# Patient Record
Sex: Female | Born: 1970 | Race: Black or African American | Hispanic: No | Marital: Married | Smoking: Current every day smoker
Health system: Southern US, Community
[De-identification: ages and names within clinical notes are randomized; demographics above are authoritative.]

## PROBLEM LIST (undated history)

## (undated) ENCOUNTER — Emergency Department (HOSPITAL_COMMUNITY): Admission: EM | Payer: Self-pay | Source: Home / Self Care

## (undated) DIAGNOSIS — R42 Dizziness and giddiness: Secondary | ICD-10-CM

## (undated) DIAGNOSIS — D649 Anemia, unspecified: Secondary | ICD-10-CM

## (undated) DIAGNOSIS — G629 Polyneuropathy, unspecified: Secondary | ICD-10-CM

## (undated) DIAGNOSIS — F1010 Alcohol abuse, uncomplicated: Secondary | ICD-10-CM

## (undated) HISTORY — PX: CHOLECYSTECTOMY: SHX55

---

## 1999-08-09 ENCOUNTER — Emergency Department (HOSPITAL_COMMUNITY): Admission: EM | Admit: 1999-08-09 | Discharge: 1999-08-09 | Payer: Self-pay | Admitting: Emergency Medicine

## 1999-08-09 ENCOUNTER — Encounter: Payer: Self-pay | Admitting: Emergency Medicine

## 2000-12-17 ENCOUNTER — Encounter: Payer: Self-pay | Admitting: Emergency Medicine

## 2000-12-17 ENCOUNTER — Emergency Department (HOSPITAL_COMMUNITY): Admission: EM | Admit: 2000-12-17 | Discharge: 2000-12-18 | Payer: Self-pay | Admitting: Emergency Medicine

## 2010-11-13 ENCOUNTER — Emergency Department (HOSPITAL_COMMUNITY)
Admission: EM | Admit: 2010-11-13 | Discharge: 2010-11-13 | Discharge status: Against Medical Advice | Payer: Self-pay | Attending: Emergency Medicine | Admitting: Emergency Medicine

## 2010-11-13 DIAGNOSIS — X58XXXA Exposure to other specified factors, initial encounter: Secondary | ICD-10-CM | POA: Insufficient documentation

## 2010-11-13 DIAGNOSIS — S8990XA Unspecified injury of unspecified lower leg, initial encounter: Secondary | ICD-10-CM | POA: Insufficient documentation

## 2011-10-25 ENCOUNTER — Emergency Department (HOSPITAL_BASED_OUTPATIENT_CLINIC_OR_DEPARTMENT_OTHER)
Admission: EM | Admit: 2011-10-25 | Discharge: 2011-10-25 | Disposition: A | Discharge status: Home / Self Care | Payer: Self-pay | Attending: Emergency Medicine | Admitting: Emergency Medicine

## 2011-10-25 ENCOUNTER — Encounter (HOSPITAL_BASED_OUTPATIENT_CLINIC_OR_DEPARTMENT_OTHER): Payer: Self-pay | Admitting: *Deleted

## 2011-10-25 DIAGNOSIS — Z008 Encounter for other general examination: Secondary | ICD-10-CM | POA: Insufficient documentation

## 2011-10-25 DIAGNOSIS — Z0000 Encounter for general adult medical examination without abnormal findings: Secondary | ICD-10-CM

## 2011-10-25 DIAGNOSIS — F17200 Nicotine dependence, unspecified, uncomplicated: Secondary | ICD-10-CM | POA: Insufficient documentation

## 2011-10-25 MED ORDER — PERMETHRIN 5 % EX CREA: TOPICAL_CREAM | CUTANEOUS | Status: AC

## 2011-10-25 NOTE — ED Notes (Signed)
Does not have a rash but was out of work to take care of her grandchild who has scabies.  She needs a note to go back.

## 2011-10-25 NOTE — Discharge Instructions (Signed)
Scabies Scabies are small bugs (mites) that burrow under the skin and cause red bumps and severe itching. These bugs can only be seen with a microscope. Scabies are highly contagious. They can spread easily from person to person by direct contact. They are also spread through sharing clothing or linens that have the scabies mites living in them. It is not unusual for an entire family to become infected through shared towels, clothing, or bedding.  HOME CARE INSTRUCTIONS   Your caregiver may prescribe a cream or lotion to kill the mites. If this cream is prescribed; massage the cream into the entire area of the body from the neck to the bottom of both feet. Also massage the cream into the scalp and face if your child is less than 1 year old. Avoid the eyes and mouth.   Leave the cream on for 8 to12 hours. Do not wash your hands after application. Your child should bathe or shower after the 8 to 12 hour application period. Sometimes it is helpful to apply the cream to your child at right before bedtime.   One treatment is usually effective and will eliminate approximately 95% of infestations. For severe cases, your caregiver may decide to repeat the treatment in 1 week. Everyone in your household should be treated with one application of the cream.   New rashes or burrows should not appear after successful treatment within 24 to 48 hours; however the itching and rash may last for 2 to 4 weeks after successful treatment. If your symptoms persist longer than this, see your caregiver.   Your caregiver also may prescribe a medication to help with the itching or to help the rash go away more quickly.   Scabies can live on clothing or linens for up to 3 days. Your entire child's recently used clothing, towels, stuffed toys, and bed linens should be washed in hot water and then dried in a dryer for at least 20 minutes on high heat. Items that cannot be washed should be enclosed in a plastic bag for at least 3  days.   To help relieve itching, bathe your child in a cool bath or apply cool washcloths to the affected areas.   Your child may return to school after treatment with the prescribed cream.  SEEK MEDICAL CARE IF:   The itching persists longer than 4 weeks after treatment.   The rash spreads or becomes infected (the area has red blisters or yellow-tan crust).  Document Released: 07/02/2005 Document Revised: 06/21/2011 Document Reviewed: 11/10/2008 [employer] Patient Information 2012 [employer]. 

## 2011-10-25 NOTE — ED Provider Notes (Signed)
History     CSN: [id number]  Arrival date & time 10/25/11  1249   First MD Initiated Contact with Patient 10/25/11 1307      Chief Complaint  Patient presents with  . Rash    (Consider location/radiation/quality/duration/timing/severity/associated sxs/prior treatment) HPI Comments: No rash.  Pt exposed to scabies.  Pt needs a note for employer so she can return to work  Patient is a 41 y.o. female presenting with rash. The history is provided by the patient. No language interpreter was used.  Rash  This is a new problem. The problem is associated with nothing.    History reviewed. No pertinent past medical history.  History reviewed. No pertinent past surgical history.  No family history on file.  History  Substance Use Topics  . Smoking status: Current Everyday Smoker -- 0.5 packs/day  . Smokeless tobacco: Not on file  . Alcohol Use: Yes    OB History    Grav Para Term Preterm Abortions TAB SAB Ect Mult Living                  Review of Systems  Skin: Negative for rash.    Allergies  Review of patient's allergies indicates no known allergies.  Home Medications   Current Outpatient Rx  Name Route Sig Dispense Refill  . PERMETHRIN 5 % EX CREA  Apply to affected area once 60 g 0    BP 143/91  Pulse 72  Temp(Src) 98.2 F (36.8 C) (Oral)  Resp 20  SpO2 100%  Physical Exam  Nursing note and vitals reviewed. Constitutional: She is oriented to person, place, and time. She appears well-developed and well-nourished.  HENT:  Head: Normocephalic.  Cardiovascular: Normal rate.   Pulmonary/Chest: Effort normal.  Musculoskeletal: Normal range of motion.  Neurological: She is alert and oriented to person, place, and time.  Skin: Skin is warm and dry.  Psychiatric: She has a normal mood and affect.       No rash    ED Course  Procedures (including critical care time)  Labs Reviewed - No data to display No results found.   1. Normal physical exam        MDM  I gave rx for elemite in case pt develops rash.   I do not see any sign of scabies       Lonia Skinner [place], [place] 10/25/11 [address] [place] , [place] 10/25/11 1329

## 2011-10-25 NOTE — ED Provider Notes (Signed)
Medical screening examination/treatment/procedure(s) were performed by non-physician practitioner and as supervising physician I was immediately available for consultation/collaboration.   Lyanne Co, MD 10/25/11 1330

## 2013-09-08 ENCOUNTER — Encounter (HOSPITAL_COMMUNITY): Payer: Self-pay | Admitting: Emergency Medicine

## 2013-09-08 ENCOUNTER — Emergency Department (HOSPITAL_COMMUNITY)
Admission: EM | Admit: 2013-09-08 | Discharge: 2013-09-08 | Discharge status: Against Medical Advice | Payer: Self-pay | Attending: Emergency Medicine | Admitting: Emergency Medicine

## 2013-09-08 DIAGNOSIS — R6883 Chills (without fever): Secondary | ICD-10-CM | POA: Insufficient documentation

## 2013-09-08 DIAGNOSIS — R197 Diarrhea, unspecified: Secondary | ICD-10-CM | POA: Insufficient documentation

## 2013-09-08 DIAGNOSIS — F17200 Nicotine dependence, unspecified, uncomplicated: Secondary | ICD-10-CM | POA: Insufficient documentation

## 2013-09-08 DIAGNOSIS — R112 Nausea with vomiting, unspecified: Secondary | ICD-10-CM | POA: Insufficient documentation

## 2013-09-08 LAB — COMPREHENSIVE METABOLIC PANEL
ALT: 29 U/L (ref 0–35)
AST: 39 U/L — ABNORMAL HIGH (ref 0–37)
Albumin: 3.8 g/dL (ref 3.5–5.2)
Alkaline Phosphatase: 129 U/L — ABNORMAL HIGH (ref 39–117)
BILIRUBIN TOTAL: 0.3 mg/dL (ref 0.3–1.2)
BUN: 9 mg/dL (ref 6–23)
CO2: 25 meq/L (ref 19–32)
Calcium: 9.9 mg/dL (ref 8.4–10.5)
Chloride: 101 mEq/L (ref 96–112)
Creatinine, Ser: 0.74 mg/dL (ref 0.50–1.10)
GLUCOSE: 118 mg/dL — AB (ref 70–99)
POTASSIUM: 4.2 meq/L (ref 3.7–5.3)
Sodium: 140 mEq/L (ref 137–147)
Total Protein: 8.1 g/dL (ref 6.0–8.3)

## 2013-09-08 LAB — CBC WITH DIFFERENTIAL/PLATELET
BASOS ABS: 0 10*3/uL (ref 0.0–0.1)
Basophils Relative: 0 % (ref 0–1)
Eosinophils Absolute: 0 10*3/uL (ref 0.0–0.7)
Eosinophils Relative: 0 % (ref 0–5)
HCT: 43.7 % (ref 36.0–46.0)
Hemoglobin: 14.9 g/dL (ref 12.0–15.0)
LYMPHS ABS: 1.2 10*3/uL (ref 0.7–4.0)
LYMPHS PCT: 9 % — AB (ref 12–46)
MCH: 33 pg (ref 26.0–34.0)
MCHC: 34.1 g/dL (ref 30.0–36.0)
MCV: 96.7 fL (ref 78.0–100.0)
Monocytes Absolute: 1.2 10*3/uL — ABNORMAL HIGH (ref 0.1–1.0)
Monocytes Relative: 9 % (ref 3–12)
Neutro Abs: 10.7 10*3/uL — ABNORMAL HIGH (ref 1.7–7.7)
Neutrophils Relative %: 82 % — ABNORMAL HIGH (ref 43–77)
Platelets: 247 10*3/uL (ref 150–400)
RBC: 4.52 MIL/uL (ref 3.87–5.11)
RDW: 14.1 % (ref 11.5–15.5)
WBC: 13.1 10*3/uL — AB (ref 4.0–10.5)

## 2013-09-08 LAB — LIPASE, BLOOD: Lipase: 22 U/L (ref 11–59)

## 2013-09-08 MED ORDER — ONDANSETRON 4 MG PO TBDP
8.0000 mg | ORAL_TABLET | Freq: Once | ORAL | Status: AC
  Administered 2013-09-08: 8 mg via ORAL
  Filled 2013-09-08: qty 2

## 2013-09-08 NOTE — ED Notes (Signed)
Pt. reports nausea , vomitting and diarrhea with chills after eating clams this evening .

## 2016-12-09 ENCOUNTER — Encounter (HOSPITAL_BASED_OUTPATIENT_CLINIC_OR_DEPARTMENT_OTHER): Payer: Self-pay | Admitting: Emergency Medicine

## 2016-12-09 ENCOUNTER — Emergency Department (HOSPITAL_BASED_OUTPATIENT_CLINIC_OR_DEPARTMENT_OTHER)
Admission: EM | Admit: 2016-12-09 | Discharge: 2016-12-09 | Disposition: A | Discharge status: Home / Self Care | Payer: Self-pay | Attending: Emergency Medicine | Admitting: Emergency Medicine

## 2016-12-09 ENCOUNTER — Emergency Department (HOSPITAL_BASED_OUTPATIENT_CLINIC_OR_DEPARTMENT_OTHER): Payer: Self-pay

## 2016-12-09 DIAGNOSIS — F17200 Nicotine dependence, unspecified, uncomplicated: Secondary | ICD-10-CM | POA: Insufficient documentation

## 2016-12-09 DIAGNOSIS — S39012A Strain of muscle, fascia and tendon of lower back, initial encounter: Secondary | ICD-10-CM

## 2016-12-09 DIAGNOSIS — W0110XA Fall on same level from slipping, tripping and stumbling with subsequent striking against unspecified object, initial encounter: Secondary | ICD-10-CM | POA: Insufficient documentation

## 2016-12-09 DIAGNOSIS — Y999 Unspecified external cause status: Secondary | ICD-10-CM | POA: Insufficient documentation

## 2016-12-09 DIAGNOSIS — Y939 Activity, unspecified: Secondary | ICD-10-CM | POA: Insufficient documentation

## 2016-12-09 DIAGNOSIS — S300XXA Contusion of lower back and pelvis, initial encounter: Secondary | ICD-10-CM

## 2016-12-09 DIAGNOSIS — A599 Trichomoniasis, unspecified: Secondary | ICD-10-CM

## 2016-12-09 DIAGNOSIS — Y929 Unspecified place or not applicable: Secondary | ICD-10-CM | POA: Insufficient documentation

## 2016-12-09 LAB — URINALYSIS, MICROSCOPIC (REFLEX)

## 2016-12-09 LAB — URINALYSIS, ROUTINE W REFLEX MICROSCOPIC
Bilirubin Urine: NEGATIVE
Glucose, UA: NEGATIVE mg/dL
Ketones, ur: 15 mg/dL — AB
Nitrite: POSITIVE — AB
Protein, ur: NEGATIVE mg/dL
Specific Gravity, Urine: 1.023 (ref 1.005–1.030)
pH: 5.5 (ref 5.0–8.0)

## 2016-12-09 MED ORDER — KETOROLAC TROMETHAMINE 60 MG/2ML IM SOLN
60.0000 mg | Freq: Once | INTRAMUSCULAR | Status: AC
  Administered 2016-12-09: 60 mg via INTRAMUSCULAR
  Filled 2016-12-09: qty 2

## 2016-12-09 MED ORDER — NAPROXEN 375 MG PO TABS: 375.0000 mg | ORAL_TABLET | Freq: Two times a day (BID) | ORAL | 0 refills | Status: DC

## 2016-12-09 MED ORDER — METRONIDAZOLE 500 MG PO TABS: 500.0000 mg | ORAL_TABLET | Freq: Two times a day (BID) | ORAL | 0 refills | Status: DC

## 2016-12-09 MED ORDER — CYCLOBENZAPRINE HCL 10 MG PO TABS
10.0000 mg | ORAL_TABLET | Freq: Once | ORAL | Status: AC
  Administered 2016-12-09: 10 mg via ORAL
  Filled 2016-12-09: qty 1

## 2016-12-09 MED ORDER — CYCLOBENZAPRINE HCL 10 MG PO TABS: 10.0000 mg | ORAL_TABLET | Freq: Two times a day (BID) | ORAL | 0 refills | Status: DC | PRN

## 2016-12-09 NOTE — ED Notes (Signed)
Pt called for room placement, no response from lobby.

## 2016-12-09 NOTE — ED Notes (Signed)
PT reports she fell last week and has been having right lower back and right flank pain since and having trouble having BM and painful gas. Pt sts she lost her balance and fell backwards on concrete landing on her tailbone.

## 2016-12-09 NOTE — ED Notes (Signed)
ED Provider at bedside. 

## 2016-12-09 NOTE — ED Provider Notes (Signed)
[facility]   CSN: [id number] Arrival date & time: 12/09/16  1416  By signing my name below, I, Teofilo Pod, attest that this documentation has been prepared under the direction and in the presence of Gwyneth Sprout, MD . Electronically Signed: Teofilo Pod, ED Scribe. 12/09/2016. 4:35 PM.    History   Chief Complaint Chief Complaint  Patient presents with  . Fall  . Back Pain   The history is provided by the patient. No language interpreter was used.   HPI Comments:  Veronica Gaines is a 46 y.o. female who presents to the Emergency Department complaining of ongoing lower back pain x 1 week. Pt reports that she fell 1 week ago, landing on her buttocks on concrete. Pt also reports that for the last 2 days, she has seen blood on the tissue after urinating. She has used ice and heat with no relief. Denies any LOC, vaginal pain.     History reviewed. No pertinent past medical history.  There are no active problems to display for this patient.   Past Surgical History:  Procedure Laterality Date  . CESAREAN SECTION      OB History    No data available       Home Medications    Prior to Admission medications   Not on File    Family History No family history on file.  Social History Social History  Substance Use Topics  . Smoking status: Current Every Day Smoker    Packs/day: 0.50  . Smokeless tobacco: Never Used  . Alcohol use Yes     Allergies   Patient has no known allergies.   Review of Systems Review of Systems  All systems reviewed and are negative for acute change except as noted in the HPI.    Physical Exam Updated Vital Signs BP (!) 125/103 (BP Location: Left Arm)   Pulse 84   Temp 99.2 F (37.3 C) (Oral)   Resp 20   Ht 5\' 7"  (1.702 m)   Wt 250 lb (113.4 kg)   SpO2 99%   BMI 39.16 kg/m   Physical Exam  Constitutional: She is oriented to person, place, and time. She appears well-developed  and well-nourished. No distress.  HENT:  Head: Normocephalic and atraumatic.  Eyes: Conjunctivae are normal.  Cardiovascular: Normal rate, regular rhythm and normal heart sounds.   Pulmonary/Chest: Effort normal and breath sounds normal.  Abdominal: Soft. She exhibits no distension. There is no tenderness.  Musculoskeletal:  Tenderness in the sacrum and coccyx with palpation with right sided paralumbar tenderness. No ecchymosis or swelling present.   Neurological: She is alert and oriented to person, place, and time. No sensory deficit. Coordination normal.  Skin: Skin is warm and dry.  Psychiatric: She has a normal mood and affect.  Nursing note and vitals reviewed.    ED Treatments / Results  DIAGNOSTIC STUDIES:  Oxygen Saturation is 99% on RA, normal by my interpretation.    COORDINATION OF CARE:  4:32 PM Discussed treatment plan with pt at bedside and pt agreed to plan.   Labs (all labs ordered are listed, but only abnormal results are displayed) Labs Reviewed  URINALYSIS, ROUTINE W REFLEX MICROSCOPIC - Abnormal; Notable for the following:       Result Value   Color, Urine AMBER (*)    APPearance CLOUDY (*)    Hgb urine dipstick TRACE (*)    Ketones, ur 15 (*)    Nitrite POSITIVE (*)  Leukocytes, UA MODERATE (*)    All other components within normal limits  URINALYSIS, MICROSCOPIC (REFLEX) - Abnormal; Notable for the following:    Bacteria, UA MANY (*)    Squamous Epithelial / LPF 0-5 (*)    All other components within normal limits    EKG  EKG Interpretation None       Radiology Dg Sacrum/coccyx  Result Date: 12/09/2016 CLINICAL DATA:  Fall 1 week ago, pain in coccyx area. EXAM: SACRUM AND COCCYX - 2+ VIEW COMPARISON:  None. FINDINGS: There is no evidence of fracture or other focal bone lesions. IMPRESSION: Negative. Electronically Signed   By: Bary RichardStan  Maynard M.D.   On: 12/09/2016 16:23    Procedures Procedures (including critical care  time)  Medications Ordered in ED Medications  ketorolac (TORADOL) injection 60 mg (not administered)  cyclobenzaprine (FLEXERIL) tablet 10 mg (not administered)     Initial Impression / Assessment and Plan / ED Course  I have reviewed the triage vital signs and the nursing notes.  Pertinent labs & imaging results that were available during my care of the patient were reviewed by me and considered in my medical decision making (see chart for details).    Patient presenting due to ongoing back pain that started a week ago after she had a mechanical fall onto her coccyx. She has pain in the sacral area and lower lumbar area radiating over to the right side. There is no significant ecchymosis or swelling. Patient has normal gait strength and sensation. Feel most likely lumbar strain. X-ray is negative for sacral or coccyx fracture. And she is neurologically intact. Secondly patient has noticed some blood in her urine over the last few days but denies any vaginal discharge, bleeding. She does not have dysuria, fever or flank pain. Patient on exam today is found to have Trichomonas in her urine along with trace blood and leukocytes with white blood cells. Feel most likely patient has urethralis from Trichomonas. Patient was told that she had trichomonas and was treated with Flagyl. She was given anti-inflammatories and Flexeril for her musculoskeletal pain as well as follow-up.  Final Clinical Impressions(s) / ED Diagnoses   Final diagnoses:  Sacral contusion, initial encounter  Lumbar strain, initial encounter  Trichomonas infection    New Prescriptions Discharge Medication List as of 12/09/2016  5:20 PM    START taking these medications   Details  cyclobenzaprine (FLEXERIL) 10 MG tablet Take 1 tablet (10 mg total) by mouth 2 (two) times daily as needed for muscle spasms., Starting Sun 12/09/2016, Print    metroNIDAZOLE (FLAGYL) 500 MG tablet Take 1 tablet (500 mg total) by mouth 2 (two)  times daily., Starting Sun 12/09/2016, Print    naproxen (NAPROSYN) 375 MG tablet Take 1 tablet (375 mg total) by mouth 2 (two) times daily., Starting Sun 12/09/2016, Print       I personally performed the services described in this documentation, which was scribed in my presence.  The recorded information has been reviewed and considered.    Gwyneth SproutPlunkett, Talea Manges, MD 12/09/16 [phone]

## 2016-12-09 NOTE — ED Triage Notes (Signed)
Fell x 1 week ago with lower back pain . No LOC, has had 1 episode of hematuria today.

## 2017-06-03 ENCOUNTER — Encounter: Payer: Self-pay | Admitting: Podiatry

## 2017-08-10 ENCOUNTER — Other Ambulatory Visit: Payer: Self-pay

## 2017-08-10 ENCOUNTER — Emergency Department (HOSPITAL_BASED_OUTPATIENT_CLINIC_OR_DEPARTMENT_OTHER)
Admission: EM | Admit: 2017-08-10 | Discharge: 2017-08-10 | Disposition: A | Discharge status: Home / Self Care | Payer: Self-pay | Attending: Emergency Medicine | Admitting: Emergency Medicine

## 2017-08-10 ENCOUNTER — Emergency Department (HOSPITAL_BASED_OUTPATIENT_CLINIC_OR_DEPARTMENT_OTHER): Payer: Self-pay

## 2017-08-10 ENCOUNTER — Encounter (HOSPITAL_BASED_OUTPATIENT_CLINIC_OR_DEPARTMENT_OTHER): Payer: Self-pay | Admitting: Emergency Medicine

## 2017-08-10 DIAGNOSIS — R030 Elevated blood-pressure reading, without diagnosis of hypertension: Secondary | ICD-10-CM | POA: Insufficient documentation

## 2017-08-10 DIAGNOSIS — M7989 Other specified soft tissue disorders: Secondary | ICD-10-CM | POA: Insufficient documentation

## 2017-08-10 DIAGNOSIS — F17210 Nicotine dependence, cigarettes, uncomplicated: Secondary | ICD-10-CM | POA: Insufficient documentation

## 2017-08-10 DIAGNOSIS — Z79899 Other long term (current) drug therapy: Secondary | ICD-10-CM | POA: Insufficient documentation

## 2017-08-10 NOTE — Discharge Instructions (Signed)
Keep your your right leg elevated above your heart as much as possible.  Call the number on these instructions in 2 days to get a primary care physician.  You should get your blood pressure rechecked within the next 3 weeks and get your leg rechecked if swelling does not go down within the next 3 weeks.  Today's blood pressure was mildly elevated at 136/75

## 2017-08-10 NOTE — ED Provider Notes (Signed)
[facility] EMERGENCY DEPARTMENT Provider Note   CSN: [id number] Arrival date & time: 08/10/17  1308     History   Chief Complaint Chief Complaint  Patient presents with  . Leg Pain    HPI Veronica Gaines is a 47 y.o. female.  Complains of right lower leg pain and swelling onset 5 days ago.  Pain and swelling worse at the end of the day after prolonged standing improved with elevation.  Treated with Epson salts with partial relief.  No chest pain or shortness of breath no other complaint.  With no other associated symptoms.  HPI  History reviewed. No pertinent past medical history.  There are no active problems to display for this patient.   Past Surgical History:  Procedure Laterality Date  . CESAREAN SECTION      OB History    No data available       Home Medications    Prior to Admission medications   Medication Sig Start Date End Date Taking? Authorizing Provider  cyclobenzaprine (FLEXERIL) 10 MG tablet Take 1 tablet (10 mg total) by mouth 2 (two) times daily as needed for muscle spasms. 12/09/16   Gwyneth Sprout, MD  metroNIDAZOLE (FLAGYL) 500 MG tablet Take 1 tablet (500 mg total) by mouth 2 (two) times daily. 12/09/16   Gwyneth Sprout, MD  naproxen (NAPROSYN) 375 MG tablet Take 1 tablet (375 mg total) by mouth 2 (two) times daily. 12/09/16   Gwyneth Sprout, MD    Family History No family history on file.  Social History Social History   Tobacco Use  . Smoking status: Current Every Day Smoker    Packs/day: 0.50  . Smokeless tobacco: Never Used  Substance Use Topics  . Alcohol use: Yes  . Drug use: No  Drinks 1/2 gallon of liquor per week   Allergies   Patient has no known allergies.   Review of Systems Review of Systems  Constitutional: Negative.   HENT: Negative.   Respiratory: Negative.   Cardiovascular: Negative.   Gastrointestinal: Negative.   Genitourinary:       Post menopausal  Musculoskeletal: Positive for  myalgias.       RightLeg pain and swelling  Skin: Negative.   Neurological: Negative.   Psychiatric/Behavioral: Negative.   All other systems reviewed and are negative.    Physical Exam Updated Vital Signs BP 119/78 (BP Location: Right Arm)   Pulse 86   Temp 98.4 F (36.9 C) (Oral)   Resp 18   Ht 5\' 7"  (1.702 m)   Wt 124.7 kg (275 lb)   SpO2 98%   BMI 43.07 kg/m   Physical Exam  Constitutional: She appears well-developed and well-nourished. No distress.  HENT:  Head: Normocephalic and atraumatic.  Eyes: Conjunctivae are normal. Pupils are equal, round, and reactive to light.  Neck: Neck supple. No tracheal deviation present. No thyromegaly present.  Cardiovascular: Normal rate and regular rhythm.  No murmur heard. Pulmonary/Chest: Effort normal and breath sounds normal.  Abdominal: Soft. Bowel sounds are normal. She exhibits no distension. There is no tenderness.  Obese  Musculoskeletal: Normal range of motion. She exhibits no edema or tenderness.  Right lower extremity with 1+ pretibial pitting edema calf is also slightly swollen.  No crepitance.  Minimally tender not red or warm in comparison to contralateral leg.  DP and PT pulses 2+.  Good capillary refill.  All other extremities without redness swelling or tenderness neurovascular intact  Neurological: She is alert. Coordination normal.  Skin: Skin is warm and dry. No rash noted.  Psychiatric: She has a normal mood and affect.  Nursing note and vitals reviewed.    ED Treatments / Results  Labs (all labs ordered are listed, but only abnormal results are displayed) Labs Reviewed - No data to display  EKG  EKG Interpretation None       Radiology No results found.  Procedures Procedures (including critical care time)  Medications Ordered in ED Medications - No data to display   Initial Impression / Assessment and Plan / ED Course  I have reviewed the triage vital signs and the nursing  notes.  Pertinent labs & imaging results that were available during my care of the patient were reviewed by me and considered in my medical decision making (see chart for details).     4:50 PM patient resting comfortably.  No distress, ambulates without difficulty.  I suspect lymphedema of the leg.  I do not detect signs of infection. Plan elevation of leg.  Blood pressure recheck 3 weeks.  Referral primary care  Final Clinical Impressions(s) / ED Diagnoses  Dx #1 right leg swelling #2 elevated blood pressure Final diagnoses:  None    ED Discharge Orders    None       Doug SouJacubowitz, Pricsilla Lindvall, MD 08/10/17 1657

## 2017-08-10 NOTE — ED Triage Notes (Signed)
R calf pain and ankle swelling x 3 days. States she started a new job where she is on her feet a lot. Denies SOB

## 2017-08-10 NOTE — ED Notes (Signed)
Patient transported to Ultrasound 

## 2019-12-08 ENCOUNTER — Emergency Department (HOSPITAL_BASED_OUTPATIENT_CLINIC_OR_DEPARTMENT_OTHER)
Admission: EM | Admit: 2019-12-08 | Discharge: 2019-12-08 | Disposition: A | Discharge status: Home / Self Care | Payer: Self-pay | Attending: Emergency Medicine | Admitting: Emergency Medicine

## 2019-12-08 ENCOUNTER — Encounter (HOSPITAL_BASED_OUTPATIENT_CLINIC_OR_DEPARTMENT_OTHER): Payer: Self-pay

## 2019-12-08 ENCOUNTER — Other Ambulatory Visit: Payer: Self-pay

## 2019-12-08 ENCOUNTER — Emergency Department (HOSPITAL_BASED_OUTPATIENT_CLINIC_OR_DEPARTMENT_OTHER): Payer: Self-pay

## 2019-12-08 DIAGNOSIS — R42 Dizziness and giddiness: Secondary | ICD-10-CM | POA: Insufficient documentation

## 2019-12-08 DIAGNOSIS — Z79899 Other long term (current) drug therapy: Secondary | ICD-10-CM | POA: Insufficient documentation

## 2019-12-08 DIAGNOSIS — F17210 Nicotine dependence, cigarettes, uncomplicated: Secondary | ICD-10-CM | POA: Insufficient documentation

## 2019-12-08 DIAGNOSIS — Z20822 Contact with and (suspected) exposure to covid-19: Secondary | ICD-10-CM | POA: Insufficient documentation

## 2019-12-08 DIAGNOSIS — H5500 Unspecified nystagmus: Secondary | ICD-10-CM | POA: Insufficient documentation

## 2019-12-08 LAB — DIFFERENTIAL
Abs Immature Granulocytes: 0.02 10*3/uL (ref 0.00–0.07)
Basophils Absolute: 0 10*3/uL (ref 0.0–0.1)
Basophils Relative: 0 %
Eosinophils Absolute: 0.1 10*3/uL (ref 0.0–0.5)
Eosinophils Relative: 1 %
Immature Granulocytes: 0 %
Lymphocytes Relative: 37 %
Lymphs Abs: 2.8 10*3/uL (ref 0.7–4.0)
Monocytes Absolute: 0.7 10*3/uL (ref 0.1–1.0)
Monocytes Relative: 9 %
Neutro Abs: 4 10*3/uL (ref 1.7–7.7)
Neutrophils Relative %: 53 %

## 2019-12-08 LAB — COMPREHENSIVE METABOLIC PANEL
ALT: 30 U/L (ref 0–44)
AST: 59 U/L — ABNORMAL HIGH (ref 15–41)
Albumin: 3.8 g/dL (ref 3.5–5.0)
Alkaline Phosphatase: 117 U/L (ref 38–126)
Anion gap: 14 (ref 5–15)
BUN: 9 mg/dL (ref 6–20)
CO2: 25 mmol/L (ref 22–32)
Calcium: 9.1 mg/dL (ref 8.9–10.3)
Chloride: 102 mmol/L (ref 98–111)
Creatinine, Ser: 0.8 mg/dL (ref 0.44–1.00)
GFR calc Af Amer: >60 mL/min (ref >60)
GFR calc non Af Amer: >60 mL/min (ref >60)
Glucose, Bld: 113 mg/dL — ABNORMAL HIGH (ref 70–99)
Potassium: 4.1 mmol/L (ref 3.5–5.1)
Sodium: 141 mmol/L (ref 135–145)
Total Bilirubin: 0.6 mg/dL (ref 0.3–1.2)
Total Protein: 7.8 g/dL (ref 6.5–8.1)

## 2019-12-08 LAB — PROTIME-INR
INR: 1 (ref 0.8–1.2)
Prothrombin Time: 13.1 seconds (ref 11.4–15.2)

## 2019-12-08 LAB — CBC
HCT: 46.1 % — ABNORMAL HIGH (ref 36.0–46.0)
Hemoglobin: 15 g/dL (ref 12.0–15.0)
MCH: 34.4 pg — ABNORMAL HIGH (ref 26.0–34.0)
MCHC: 32.5 g/dL (ref 30.0–36.0)
MCV: 105.7 fL — ABNORMAL HIGH (ref 80.0–100.0)
Platelets: 273 10*3/uL (ref 150–400)
RBC: 4.36 MIL/uL (ref 3.87–5.11)
RDW: 14 % (ref 11.5–15.5)
WBC: 7.6 10*3/uL (ref 4.0–10.5)
nRBC: 0 % (ref 0.0–0.2)

## 2019-12-08 LAB — ETHANOL: Alcohol, Ethyl (B): 13 mg/dL — ABNORMAL HIGH (ref <10)

## 2019-12-08 LAB — SARS CORONAVIRUS 2 BY RT PCR (HOSPITAL ORDER, PERFORMED IN ~~LOC~~ HOSPITAL LAB): SARS Coronavirus 2: NEGATIVE

## 2019-12-08 LAB — APTT: aPTT: 26 seconds (ref 24–36)

## 2019-12-08 MED ORDER — DIPHENHYDRAMINE HCL 50 MG/ML IJ SOLN
25.0000 mg | Freq: Once | INTRAMUSCULAR | Status: AC
  Administered 2019-12-08: 25 mg via INTRAVENOUS
  Filled 2019-12-08: qty 1

## 2019-12-08 MED ORDER — PROCHLORPERAZINE EDISYLATE 10 MG/2ML IJ SOLN
10.0000 mg | Freq: Once | INTRAMUSCULAR | Status: AC
  Administered 2019-12-08: 10 mg via INTRAVENOUS
  Filled 2019-12-08: qty 2

## 2019-12-08 MED ORDER — IOHEXOL 350 MG/ML SOLN
100.0000 mL | Freq: Once | INTRAVENOUS | Status: AC | PRN
  Administered 2019-12-08: 100 mL via INTRAVENOUS

## 2019-12-08 NOTE — ED Provider Notes (Signed)
[facility] EMERGENCY DEPARTMENT Provider Note   CSN: [id number] Arrival date & time: 12/08/19  [id number]     History Chief Complaint  Patient presents with  . Headache    Veronica HardyVicki Y Gaines is a 49 y.o. female.  49 yo F with a chief complaints of a headache.  Patient woke up this morning and felt normal took a shower and when she got out she felt like she had a terrible headache that started on the left side.  Made her feel like she was unsteady and had difficulty walking.  She also feels that her eyes are moving uncontrollably.  She denies one-sided numbness or weakness denies difficulty with speech or swallowing.  Denies recent trauma.  She does feel that it radiates into her left side of her neck.  The history is provided by the patient.  Headache Pain location:  Frontal Radiates to:  Eyes Severity currently:  10/10 Severity at highest:  10/10 Onset quality:  Sudden Duration:  3 hours Timing:  Constant Progression:  Unchanged Chronicity:  New Similar to prior headaches: no   Relieved by:  Nothing Worsened by:  Nothing Ineffective treatments:  None tried Associated symptoms: eye pain   Associated symptoms: no congestion, no dizziness, no fever, no myalgias, no nausea and no vomiting        History reviewed. No pertinent past medical history.  There are no problems to display for this patient.   Past Surgical History:  Procedure Laterality Date  . CESAREAN SECTION       OB History   No obstetric history on file.     No family history on file.  Social History   Tobacco Use  . Smoking status: Current Every Day Smoker    Packs/day: 0.50    Types: Cigarettes  . Smokeless tobacco: Never Used  Substance Use Topics  . Alcohol use: Yes    Comment: daily  . Drug use: No    Home Medications Prior to Admission medications   Medication Sig Start Date End Date Taking? Authorizing Provider  cyclobenzaprine (FLEXERIL) 10 MG tablet Take 1 tablet (10 mg  total) by mouth 2 (two) times daily as needed for muscle spasms. 12/09/16   Gwyneth SproutPlunkett, Whitney, MD  metroNIDAZOLE (FLAGYL) 500 MG tablet Take 1 tablet (500 mg total) by mouth 2 (two) times daily. 12/09/16   Gwyneth SproutPlunkett, Whitney, MD  naproxen (NAPROSYN) 375 MG tablet Take 1 tablet (375 mg total) by mouth 2 (two) times daily. 12/09/16   Gwyneth SproutPlunkett, Whitney, MD    Allergies    Patient has no known allergies.  Review of Systems   Review of Systems  Constitutional: Negative for chills and fever.  HENT: Negative for congestion and rhinorrhea.   Eyes: Positive for pain and visual disturbance. Negative for redness.  Respiratory: Negative for shortness of breath and wheezing.   Cardiovascular: Negative for chest pain and palpitations.  Gastrointestinal: Negative for nausea and vomiting.  Genitourinary: Negative for dysuria and urgency.  Musculoskeletal: Negative for arthralgias and myalgias.  Skin: Negative for pallor and wound.  Neurological: Positive for headaches. Negative for dizziness.    Physical Exam Updated Vital Signs BP (!) 130/92   Pulse (!) 56   Temp 97.9 F (36.6 C) (Oral)   Resp 17   Ht 5\' 7"  (1.702 m)   Wt 119.4 kg   SpO2 98%   BMI 41.22 kg/m   Physical Exam Vitals and nursing note reviewed.  Constitutional:      General: She  is not in acute distress.    Appearance: She is well-developed. She is not diaphoretic.  HENT:     Head: Normocephalic and atraumatic.  Eyes:     Pupils: Pupils are equal, round, and reactive to light.     Comments: Nonfatigable leftward going nystagmus.  Corneas are hazy bilaterally but pupils are reactive 3 mm  Cardiovascular:     Rate and Rhythm: Normal rate and regular rhythm.     Heart sounds: No murmur. No friction rub. No gallop.   Pulmonary:     Effort: Pulmonary effort is normal.     Breath sounds: No wheezing or rales.  Abdominal:     General: There is no distension.     Palpations: Abdomen is soft.     Tenderness: There is no  abdominal tenderness.  Musculoskeletal:        General: No tenderness.     Cervical back: Normal range of motion and neck supple.  Skin:    General: Skin is warm and dry.  Neurological:     Mental Status: She is alert and oriented to person, place, and time.     GCS: GCS eye subscore is 4. GCS verbal subscore is 5. GCS motor subscore is 6.     Comments: Subjective left-sided diminished sensation to touch along the face.  Globally weak.  No appreciable ataxia with the arms or legs.  Unable to ambulate.  Psychiatric:        Behavior: Behavior normal.     ED Results / Procedures / Treatments   Labs (all labs ordered are listed, but only abnormal results are displayed) Labs Reviewed  ETHANOL - Abnormal; Notable for the following components:      Result Value   Alcohol, Ethyl (B) 13 (*)    All other components within normal limits  CBC - Abnormal; Notable for the following components:   HCT 46.1 (*)    MCV 105.7 (*)    MCH 34.4 (*)    All other components within normal limits  COMPREHENSIVE METABOLIC PANEL - Abnormal; Notable for the following components:   Glucose, Bld 113 (*)    AST 59 (*)    All other components within normal limits  DIFFERENTIAL  PROTIME-INR  APTT  RAPID URINE DRUG SCREEN, HOSP PERFORMED  URINALYSIS, ROUTINE W REFLEX MICROSCOPIC  PREGNANCY, URINE    EKG EKG Interpretation  Date/Time:  Tuesday Dec 08 2019 09:47:47 EDT Ventricular Rate:  60 PR Interval:    QRS Duration: 86 QT Interval:  452 QTC Calculation: 452 R Axis:   57 Text Interpretation: Sinus rhythm new flipped t wave in lead III Otherwise no significant change Confirmed by Melene Plan [phone]) on 12/08/2019 9:59:53 AM   Radiology CT Angio Head W or Wo Contrast  Result Date: 12/08/2019 CLINICAL DATA:  Acute neuro deficit, stroke suspected. Headaches and dizziness beginning at 5:30 a.m. Blurred vision. EXAM: CT ANGIOGRAPHY HEAD AND NECK TECHNIQUE: Multidetector CT imaging of the head and neck  was performed using the standard protocol during bolus administration of intravenous contrast. Multiplanar CT image reconstructions and MIPs were obtained to evaluate the vascular anatomy. Carotid stenosis measurements (when applicable) are obtained utilizing NASCET criteria, using the distal internal carotid diameter as the denominator. CONTRAST:  OMNIPAQUE IOHEXOL 350 MG/ML SOLN COMPARISON:  None. FINDINGS: CT HEAD FINDINGS Brain: No acute infarct, hemorrhage, or mass lesion is present. The ventricles are of normal size. No significant extraaxial fluid collection is present. No significant white matter lesions  are present. The brainstem and cerebellum are within normal limits. Vascular: No hyperdense vessel or unexpected calcification. Skull: Calvarium is intact. No focal lytic or blastic lesions are present. No significant extracranial soft tissue lesion is present. Sinuses: The paranasal sinuses and mastoid air cells are clear. Orbits: A remote medial left orbital blowout fracture is noted. Review of the MIP images confirms the above findings Aspects is 10/10. CTA NECK FINDINGS Aortic arch: Knee 3 vessel arch configuration is present. No significant atherosclerotic disease present. No aneurysm or stenosis is present. Right carotid system: The right common carotid artery is within normal limits. Bifurcation is unremarkable. Cervical right ICA is normal. Left carotid system: The left common carotid artery is within normal limits. Bifurcation is unremarkable. Cervical left ICA is. Vertebral arteries: The right vertebral artery is slightly dominant to the left. A high-grade stenosis present at the proximal left vertebral artery. The proximal vessel may be occluded and reconstituted. No distal stenosis or occlusion is present. Right vertebral artery is normal. Skeleton: Vertebral heights and alignment are normal. Mild reversal of normal cervical lordosis is noted. No focal lytic or blastic lesions are present.  Other neck: The soft tissues of the neck are unremarkable. Upper chest: Lung apices are clear. Thoracic inlet is within normal limits. Review of the MIP images confirms the above findings CTA HEAD FINDINGS Anterior circulation: The internal carotid arteries are within normal limits from the skull base to the ICA termini bilaterally. The A1 and M1 segments are normal. The anterior communicating artery is patent. MCA bifurcations are within normal limits. The ACA and MCA branch vessels are normal. Posterior circulation: The right vertebral artery is the dominant vessel. PICA origins are visualized and normal. The vertebrobasilar junction is normal. Both posterior cerebral arteries originate from the basilar tip. PCA branch vessels are normal. Venous sinuses: Dural sinuses are patent. The straight sinus deep cerebral veins are within normal limits. Cortical veins are unremarkable. No intracranial vascular lesion is evident. Anatomic variants: None Review of the MIP images confirms the above findings IMPRESSION: 1. Normal variant CTA Circle of Willis without significant proximal stenosis, aneurysm, or branch vessel occlusion. 2. 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 Although the right vertebral artery is dominant, this could be related to the patient's acute posterior fossa symptoms. 3. Otherwise normal CTA of the neck. 4. Remote medial left orbital blowout fracture. Electronically Signed   By: Marin Roberts M.D.   On: 12/08/2019 10:21   CT Angio Neck W and/or Wo Contrast  Result Date: 12/08/2019 CLINICAL DATA:  Acute neuro deficit, stroke suspected. Headaches and dizziness beginning at 5:30 a.m. Blurred vision. EXAM: CT ANGIOGRAPHY HEAD AND NECK TECHNIQUE: Multidetector CT imaging of the head and neck was performed using the standard protocol during bolus administration of intravenous contrast. Multiplanar CT image reconstructions and MIPs were obtained to evaluate the vascular  anatomy. Carotid stenosis measurements (when applicable) are obtained utilizing NASCET criteria, using the distal internal carotid diameter as the denominator. CONTRAST:  OMNIPAQUE IOHEXOL 350 MG/ML SOLN COMPARISON:  None. FINDINGS: CT HEAD FINDINGS Brain: No acute infarct, hemorrhage, or mass lesion is present. The ventricles are of normal size. No significant extraaxial fluid collection is present. No significant white matter lesions are present. The brainstem and cerebellum are within normal limits. Vascular: No hyperdense vessel or unexpected calcification. Skull: Calvarium is intact. No focal lytic or blastic lesions are present. No significant extracranial soft tissue lesion is present. Sinuses: The paranasal sinuses and mastoid air  cells are clear. Orbits: A remote medial left orbital blowout fracture is noted. Review of the MIP images confirms the above findings Aspects is 10/10. CTA NECK FINDINGS Aortic arch: Knee 3 vessel arch configuration is present. No significant atherosclerotic disease present. No aneurysm or stenosis is present. Right carotid system: The right common carotid artery is within normal limits. Bifurcation is unremarkable. Cervical right ICA is normal. Left carotid system: The left common carotid artery is within normal limits. Bifurcation is unremarkable. Cervical left ICA is. Vertebral arteries: The right vertebral artery is slightly dominant to the left. A high-grade stenosis present at the proximal left vertebral artery. The proximal vessel may be occluded and reconstituted. No distal stenosis or occlusion is present. Right vertebral artery is normal. Skeleton: Vertebral heights and alignment are normal. Mild reversal of normal cervical lordosis is noted. No focal lytic or blastic lesions are present. Other neck: The soft tissues of the neck are unremarkable. Upper chest: Lung apices are clear. Thoracic inlet is within normal limits. Review of the MIP images confirms the above  findings CTA HEAD FINDINGS Anterior circulation: The internal carotid arteries are within normal limits from the skull base to the ICA termini bilaterally. The A1 and M1 segments are normal. The anterior communicating artery is patent. MCA bifurcations are within normal limits. The ACA and MCA branch vessels are normal. Posterior circulation: The right vertebral artery is the dominant vessel. PICA origins are visualized and normal. The vertebrobasilar junction is normal. Both posterior cerebral arteries originate from the basilar tip. PCA branch vessels are normal. Venous sinuses: Dural sinuses are patent. The straight sinus deep cerebral veins are within normal limits. Cortical veins are unremarkable. No intracranial vascular lesion is evident. Anatomic variants: None Review of the MIP images confirms the above findings IMPRESSION: 1. Normal variant CTA Circle of Willis without significant proximal stenosis, aneurysm, or branch vessel occlusion. 2. 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 Although the right vertebral artery is dominant, this could be related to the patient's acute posterior fossa symptoms. 3. Otherwise normal CTA of the neck. 4. Remote medial left orbital blowout fracture. Electronically Signed   By: Marin Roberts M.D.   On: 12/08/2019 10:21   CT HEAD CODE STROKE WO CONTRAST  Result Date: 12/08/2019 CLINICAL DATA:  Acute neuro deficit, stroke suspected. Headaches and dizziness beginning at 5:30 a.m. Blurred vision. EXAM: CT ANGIOGRAPHY HEAD AND NECK TECHNIQUE: Multidetector CT imaging of the head and neck was performed using the standard protocol during bolus administration of intravenous contrast. Multiplanar CT image reconstructions and MIPs were obtained to evaluate the vascular anatomy. Carotid stenosis measurements (when applicable) are obtained utilizing NASCET criteria, using the distal internal carotid diameter as the denominator. CONTRAST:  OMNIPAQUE  IOHEXOL 350 MG/ML SOLN COMPARISON:  None. FINDINGS: CT HEAD FINDINGS Brain: No acute infarct, hemorrhage, or mass lesion is present. The ventricles are of normal size. No significant extraaxial fluid collection is present. No significant white matter lesions are present. The brainstem and cerebellum are within normal limits. Vascular: No hyperdense vessel or unexpected calcification. Skull: Calvarium is intact. No focal lytic or blastic lesions are present. No significant extracranial soft tissue lesion is present. Sinuses: The paranasal sinuses and mastoid air cells are clear. Orbits: A remote medial left orbital blowout fracture is noted. Review of the MIP images confirms the above findings Aspects is 10/10. CTA NECK FINDINGS Aortic arch: Knee 3 vessel arch configuration is present. No significant atherosclerotic disease present. No aneurysm or  stenosis is present. Right carotid system: The right common carotid artery is within normal limits. Bifurcation is unremarkable. Cervical right ICA is normal. Left carotid system: The left common carotid artery is within normal limits. Bifurcation is unremarkable. Cervical left ICA is. Vertebral arteries: The right vertebral artery is slightly dominant to the left. A high-grade stenosis present at the proximal left vertebral artery. The proximal vessel may be occluded and reconstituted. No distal stenosis or occlusion is present. Right vertebral artery is normal. Skeleton: Vertebral heights and alignment are normal. Mild reversal of normal cervical lordosis is noted. No focal lytic or blastic lesions are present. Other neck: The soft tissues of the neck are unremarkable. Upper chest: Lung apices are clear. Thoracic inlet is within normal limits. Review of the MIP images confirms the above findings CTA HEAD FINDINGS Anterior circulation: The internal carotid arteries are within normal limits from the skull base to the ICA termini bilaterally. The A1 and M1 segments are  normal. The anterior communicating artery is patent. MCA bifurcations are within normal limits. The ACA and MCA branch vessels are normal. Posterior circulation: The right vertebral artery is the dominant vessel. PICA origins are visualized and normal. The vertebrobasilar junction is normal. Both posterior cerebral arteries originate from the basilar tip. PCA branch vessels are normal. Venous sinuses: Dural sinuses are patent. The straight sinus deep cerebral veins are within normal limits. Cortical veins are unremarkable. No intracranial vascular lesion is evident. Anatomic variants: None Review of the MIP images confirms the above findings IMPRESSION: 1. Normal variant CTA Circle of Willis without significant proximal stenosis, aneurysm, or branch vessel occlusion. 2. 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 Although the right vertebral artery is dominant, this could be related to the patient's acute posterior fossa symptoms. 3. Otherwise normal CTA of the neck. 4. Remote medial left orbital blowout fracture. Electronically Signed   By: Marin Roberts M.D.   On: 12/08/2019 10:21    Procedures Procedures (including critical care time)  Medications Ordered in ED Medications  prochlorperazine (COMPAZINE) injection 10 mg (10 mg Intravenous Given 12/08/19 1003)  diphenhydrAMINE (BENADRYL) injection 25 mg (25 mg Intravenous Given 12/08/19 1002)  iohexol (OMNIPAQUE) 350 MG/ML injection 100 mL (100 mLs Intravenous Contrast Given 12/08/19 0930)    ED Course  I have reviewed the triage vital signs and the nursing notes.  Pertinent labs & imaging results that were available during my care of the patient were reviewed by me and considered in my medical decision making (see chart for details).    MDM Rules/Calculators/A&P                      50 yo F with a chief complaints of headache.  This was thunderclap in onset.  Occurred about 2 and half hours ago.  Patient has nonfatigable  nystagmus.  Will make a code stroke.  CT CTA.  Lab work.  The patient was seen by teleneurology and the timeline had changed with discussion with the patient.  Now outside the window.  Patient was not given TPA.  CT of the head was negative for acute intracranial hemorrhage.  There was a high-grade stenosis of the left vertebral artery.  I discussed this with Dr. Laurence Slate, neurology.  He felt nothing to do acutely with a high-grade stenosis but did feel MRI was appropriate.  We will transfer the patient to [facility].  Discussed with Dr. Juleen China.  Accepts in transfer.   Patient is reassessed and  feels a bit better after headache cocktail.  Dizziness has improved as well.  The patients results and plan were reviewed and discussed.   Any x-rays performed were independently reviewed by myself.   Differential diagnosis were considered with the presenting HPI.  Medications  prochlorperazine (COMPAZINE) injection 10 mg (10 mg Intravenous Given 12/08/19 1003)  diphenhydrAMINE (BENADRYL) injection 25 mg (25 mg Intravenous Given 12/08/19 1002)  iohexol (OMNIPAQUE) 350 MG/ML injection 100 mL (100 mLs Intravenous Contrast Given 12/08/19 0930)    Vitals:   12/08/19 0955 12/08/19 1000 12/08/19 1012 12/08/19 1015  BP: 117/81 128/87 140/90 (!) 130/92  Pulse: (!) 59 (!) 57 (!) 52 (!) 56  Resp: 17 15 14 17   Temp:      TempSrc:      SpO2: 98% 96% 97% 98%  Weight:      Height:        Final diagnoses:  Dizziness  Nystagmus     Final Clinical Impression(s) / ED Diagnoses Final diagnoses:  Dizziness  Nystagmus    Rx / DC Orders ED Discharge Orders    None       Deno Etienne, DO 12/08/19 1042

## 2019-12-08 NOTE — ED Notes (Signed)
Pt verbalizes instructions to follow up at [facility] ED for MRI. IV remains in place and secure, educated pt to not stop or eat prior to arrival to [facility]. Report given to [employer]. Pt swabbed for Covid prior to transfer

## 2019-12-08 NOTE — ED Notes (Signed)
Pt ambulated to RR with moderate assistance and holding to railing. Pt states she was unable to collect urine sample

## 2019-12-08 NOTE — Progress Notes (Signed)
Called to get PT for MRI. Patient left hospital without scan per ED Charge RN

## 2019-12-08 NOTE — ED Triage Notes (Signed)
Pt arrives with c/o headache and dizziness upon waking at 0530 this morning. Pt reports she noticed she was having trouble standing and was dizzy upon walking to the restroom. States she showered and felt better but then had trouble focusing her eyes after getting dressed. Pt had episode of vomiting in triage. Pt reports last week she had EMS at her house for dizziness, headache, and numbness in right leg.

## 2019-12-08 NOTE — ED Notes (Signed)
Tele Neuro Activated

## 2019-12-08 NOTE — ED Notes (Signed)
Patient transported to CT 

## 2019-12-08 NOTE — Consult Note (Signed)
[facility] Tele[facility]urology Consult Services   Date of Service:   12/08/2019 09:39:39  Impression:     .  R42 - Dizziness/ Vertigo/ Giddiness     .  R51 - HA (Headache)  Comments/Sign-Out: Vertigo and headache with photophobia, nausea. Ddx includes complicated migraine, hypertensive urgency, peripheral vertigo, cerebellar infarct. No dysmetria on examination. Mild nystagmus with horizontal eye movements. Recommend MRI brain w/ and w/o, IAC protocol if available to clarify.  Metrics: Last Known Well: 12/07/2019 22:00:00 [facility] Notification Time: 12/08/2019 09:39:39 Arrival Time: 12/08/2019 08:54:00 Stamp Time: 12/08/2019 09:39:39 Time First Login Attempt: 12/08/2019 09:41:01 Symptoms: headache, dizziness, nausea, vomiting NIHSS Start Assessment Time: 12/08/2019 09:48:42 Patient is not a candidate for Alteplase/Activase. Alteplase Medical Decision: 12/08/2019 09:48:35 Patient was not deemed candidate for Alteplase/Activase thrombolytics because of following reasons: Last Well Known Above 4.5 Hours.  CT head showed no acute hemorrhage or acute core infarct. CT head was reviewed.  Lower Likelihood of Large Vessel Occlusion but Following Stat Studies are Recommended  CTA Head and Neck. CT Perfusion. Reviewed, No Indication of Large Vessel Occlusive Thrombus, Patient is not an NIR Candidate.   ED Physician notified of diagnostic impression and management plan on 12/08/2019 10:11:58  Our recommendations are outlined below.  Recommendations:     .  Activate Stroke Protocol Admission/Order Set     .  Stroke/Telemetry Floor     .  Neuro Checks     .  Bedside Swallow Eval     .  DVT Prophylaxis     .  IV Fluids, Normal Saline     .  Head of Bed 30 Degrees     .  Euglycemia and Avoid Hyperthermia (PRN Acetaminophen)     .  Recommend MRI of the brain with and without contrast (with IAC protocol if available). If MRI is negative would recommend outpatient  PT/OT for Vestibular therapy.     .  PRN Meclizine and phenergan are also recommended.  Routine Consultation with Inhouse Neurology for Follow up Care  Sign Out:     .  Discussed with Emergency Department Provider    ------------------------------------------------------------------------------  History of Present Illness: Patient is a 49 year old Female.  Patient was brought by private transportation with symptoms of headache, dizziness, nausea, vomiting  The patient awoke with dizziness, headache, and nausea/vomiting. She has difficulty focusing her eyes and states her vision is "off", denies blurriness. She states her headache is left periorbital. She has photophobia and phonophobia. She denies a h/o significant headaches. She states she is feeling vertigo. She feels the room spinning nad relates she cannot focus her vision.   Past Medical History:     . There is NO history of Hypertension     . There is NO history of Diabetes Mellitus     . There is NO history of Hyperlipidemia     . There is NO history of Atrial Fibrillation     . There is NO history of Coronary Artery Disease     . There is NO history of Stroke  Anticoagulant use:  No  Antiplatelet use: No    Examination: BP(154/96), Pulse(61), Blood Glucose(113) 1A: Level of Consciousness - Alert; keenly responsive + 0 1B: Ask Month and Age - Both Questions Right + 0 1C: Blink Eyes & Squeeze Hands - Performs Both Tasks + 0 2: Test Horizontal Extraocular Movements - Normal + 0 3: Test Visual Fields - No Visual Loss + 0 4: Test Facial Palsy (Use Grimace  if Obtunded) - Normal symmetry + 0 5A: Test Left Arm Motor Drift - No Drift for 10 Seconds + 0 5B: Test Right Arm Motor Drift - No Drift for 10 Seconds + 0 6A: Test Left Leg Motor Drift - No Drift for 5 Seconds + 0 6B: Test Right Leg Motor Drift - No Drift for 5 Seconds + 0 7: Test Limb Ataxia (FNF/Heel-Shin) - No Ataxia + 0 8: Test Sensation - Normal; No sensory  loss + 0 9: Test Language/Aphasia - Normal; No aphasia + 0 10: Test Dysarthria - Normal + 0 11: Test Extinction/Inattention - No abnormality + 0  NIHSS Score: 0  Pre-Morbid Modified Ranking Scale: 0 Points = No symptoms at all   Patient/Family was informed the Neurology Consult would occur via TeleHealth consult by way of interactive audio and video telecommunications and consented to receiving care in this manner.   Patient is being evaluated for possible acute neurologic impairment and high probability of imminent or life-threatening deterioration. I spent total of 27 minutes providing care to this patient, including time for face to face visit via telemedicine, review of medical records, imaging studies and discussion of findings with providers, the patient and/or family.   Dr Jeralene Huff   [facility] [phone]  Case [id number]

## 2019-12-08 NOTE — ED Notes (Signed)
Pt placed in car with friend driving to be taken to [facility] ED for MRI, [facility] is accepting. NAD, VSS, pt able to ambulate to car from wheelchair.

## 2019-12-08 NOTE — ED Notes (Signed)
Stroke cart at bedside  

## 2019-12-08 NOTE — Discharge Instructions (Signed)
Go to the [facility] ED now for an MRI

## 2019-12-08 NOTE — ED Notes (Signed)
Pt here for mri from [facility] Notified pt of process to get back to mri Pt now stating that she wants to leave and will come back  Pt seen walking out

## 2021-03-16 ENCOUNTER — Emergency Department (HOSPITAL_BASED_OUTPATIENT_CLINIC_OR_DEPARTMENT_OTHER): Payer: Self-pay

## 2021-03-16 ENCOUNTER — Other Ambulatory Visit: Payer: Self-pay

## 2021-03-16 ENCOUNTER — Emergency Department (HOSPITAL_BASED_OUTPATIENT_CLINIC_OR_DEPARTMENT_OTHER)
Admission: EM | Admit: 2021-03-16 | Discharge: 2021-03-16 | Disposition: A | Discharge status: Home / Self Care | Payer: Self-pay | Attending: Emergency Medicine | Admitting: Emergency Medicine

## 2021-03-16 ENCOUNTER — Other Ambulatory Visit (HOSPITAL_BASED_OUTPATIENT_CLINIC_OR_DEPARTMENT_OTHER): Payer: Self-pay

## 2021-03-16 ENCOUNTER — Encounter (HOSPITAL_BASED_OUTPATIENT_CLINIC_OR_DEPARTMENT_OTHER): Payer: Self-pay | Admitting: *Deleted

## 2021-03-16 DIAGNOSIS — F17210 Nicotine dependence, cigarettes, uncomplicated: Secondary | ICD-10-CM | POA: Insufficient documentation

## 2021-03-16 DIAGNOSIS — M25551 Pain in right hip: Secondary | ICD-10-CM | POA: Insufficient documentation

## 2021-03-16 MED ORDER — DICLOFENAC SODIUM 1 % EX GEL
2.0000 g | Freq: Four times a day (QID) | CUTANEOUS | 0 refills | Status: DC | PRN
  Filled 2021-03-16: qty 100, 12d supply, fill #0

## 2021-03-16 MED ORDER — OXYCODONE-ACETAMINOPHEN 5-325 MG PO TABS
1.0000 | ORAL_TABLET | Freq: Once | ORAL | Status: AC
  Administered 2021-03-16: 1 via ORAL
  Filled 2021-03-16: qty 1

## 2021-03-16 NOTE — Discharge Instructions (Signed)
Take Tylenol and anti-inflammatory such as the prescribed naproxen or Motrin as needed for pain control.  Additionally recommend trial of muscle relaxer.  Note this can make you slightly drowsy and should not be taken while driving.  Recommend follow-up with orthopedic or sports medicine specialist.  Come back to ER for worsening pain, fever, inability to walk or other new concerning symptoms.

## 2021-03-16 NOTE — ED Provider Notes (Signed)
Blood pressure (!) 126/91, pulse 79, temperature 98.3 F (36.8 C), temperature source Oral, resp. rate 18, height 5\' 7"  (1.702 m), weight 112 kg, SpO2 94 %.  Assuming care from Dr. .  In short, Veronica Gaines is a 50 y.o. female with a chief complaint of Hip Pain .  Refer to the original H&P for additional details.  The current plan of care is to f/u on imaging.  03:11 PM  Plain films are unremarkable. Plan for RICE and sports med/PCP follow up. Note provided for work. Patient has a ride home from the ED.     43, MD 03/16/21 [phone]

## 2021-03-16 NOTE — ED Triage Notes (Signed)
Right hip pain. She tried to brace herself to prevent a pop and felt a pop 2 days ago. Pt is ambulatory.

## 2021-03-30 ENCOUNTER — Ambulatory Visit: Payer: Self-pay

## 2021-03-30 ENCOUNTER — Ambulatory Visit (INDEPENDENT_AMBULATORY_CARE_PROVIDER_SITE_OTHER): Payer: Self-pay | Admitting: Family Medicine

## 2021-03-30 ENCOUNTER — Other Ambulatory Visit (HOSPITAL_BASED_OUTPATIENT_CLINIC_OR_DEPARTMENT_OTHER): Payer: Self-pay

## 2021-03-30 ENCOUNTER — Other Ambulatory Visit: Payer: Self-pay

## 2021-03-30 ENCOUNTER — Encounter: Payer: Self-pay | Admitting: Family Medicine

## 2021-03-30 VITALS — Ht 67.0 in | Wt 246.0 lb

## 2021-03-30 DIAGNOSIS — S76311D Strain of muscle, fascia and tendon of the posterior muscle group at thigh level, right thigh, subsequent encounter: Secondary | ICD-10-CM | POA: Insufficient documentation

## 2021-03-30 DIAGNOSIS — S76311A Strain of muscle, fascia and tendon of the posterior muscle group at thigh level, right thigh, initial encounter: Secondary | ICD-10-CM

## 2021-03-30 MED ORDER — PREDNISONE 5 MG PO TABS
ORAL_TABLET | ORAL | 0 refills | Status: DC
Start: 2021-03-30 — End: 2024-04-13
  Filled 2021-03-30: qty 21, 6d supply, fill #0

## 2021-03-30 NOTE — Progress Notes (Signed)
  Veronica Gaines - 50 y.o. female MRN [id number]  Date of birth: [birth year]  SUBJECTIVE:  Including CC & ROS.  Gaines chief complaint on file.   Veronica Gaines is a 50 y.o. female that is presenting with right hamstring pain.  This was an injury that occurred at work.  Having pain when she sits down.  Denies history of similar pain and Gaines prior surgery.  Independent review of the right hip x-ray from 9/1 shows minor degenerative changes.   Review of Systems See HPI   HISTORY: Past Medical, Surgical, Social, and Family History Reviewed & Updated per EMR.   Pertinent Historical Findings include:  History reviewed. Gaines pertinent past medical history.  Past Surgical History:  Procedure Laterality Date   CESAREAN SECTION      History reviewed. Gaines pertinent family history.  Social History   Socioeconomic History   Marital status: Married    Spouse name: Not on file   Number of children: Not on file   Years of education: Not on file   Highest education level: Not on file  Occupational History   Not on file  Tobacco Use   Smoking status: Every Day    Packs/day: 0.50    Types: Cigarettes   Smokeless tobacco: Never  Vaping Use   Vaping Use: Never used  Substance and Sexual Activity   Alcohol use: Yes    Comment: daily   Drug use: Gaines   Sexual activity: Not on file  Other Topics Concern   Not on file  Social History Narrative   Not on file   Social Determinants of Health   Financial Resource Strain: Not on file  Food Insecurity: Not on file  Transportation Needs: Not on file  Physical Activity: Not on file  Stress: Not on file  Social Connections: Not on file  Intimate Partner Violence: Not on file     PHYSICAL EXAM:  VS: Ht 5\' 7"  (1.702 m)   Wt 246 lb (111.6 kg)   BMI 38.53 kg/m  Physical Exam Gen: NAD, alert, cooperative with exam, well-appearing   Limited ultrasound: Right hamstring:  Gaines structural changes appreciated at the ischial tuberosity. Gaines  changes of the conjoint tendon or the proximal hamstring tendons. Does elicit pain with deep palpation of the probe at this proximal area  Summary: Gaines structural changes appreciated  Ultrasound and interpretation by , MD     ASSESSMENT & PLAN:   Hamstring strain, right, initial encounter Initial injury occurred on 9/1 while at work.  Appears to have a hamstring strain with irritation and reproduction of pain at the origin. -Counseled on home exercise therapy and supportive care. -Ace wrap. -Provided work note -Prednisone. -Could consider nitro, shockwave for physical therapy.

## 2021-03-30 NOTE — ED Provider Notes (Signed)
[facility] EMERGENCY DEPARTMENT Provider Note   CSN: [id number] Arrival date & time: 03/16/21  1402     History Chief Complaint  Patient presents with   Hip Pain    Veronica Gaines is a 50 y.o. female.  Presented to the emergency room with concern for hip pain.  Patient reports a couple days ago she tried to brace herself suddenly and felt sudden pain in her right hip.  Is able to bear weight.  Pain worse with movement and bearing weight, improved with rest.  Currently moderate.  Aching.  Denies numbness, weakness.   HPI     History reviewed. No pertinent past medical history.  There are no problems to display for this patient.   Past Surgical History:  Procedure Laterality Date   CESAREAN SECTION       OB History   No obstetric history on file.     No family history on file.  Social History   Tobacco Use   Smoking status: Every Day    Packs/day: 0.50    Types: Cigarettes   Smokeless tobacco: Never  Vaping Use   Vaping Use: Never used  Substance Use Topics   Alcohol use: Yes    Comment: daily   Drug use: No    Home Medications Prior to Admission medications   Medication Sig Start Date End Date Taking? Authorizing Provider  diclofenac Sodium (VOLTAREN) 1 % GEL Apply 2 g topically 4 (four) times daily as needed. 03/16/21  Yes Long, Arlyss Repress, MD  cyclobenzaprine (FLEXERIL) 10 MG tablet Take 1 tablet (10 mg total) by mouth 2 (two) times daily as needed for muscle spasms. 12/09/16   Gwyneth Sprout, MD  metroNIDAZOLE (FLAGYL) 500 MG tablet Take 1 tablet (500 mg total) by mouth 2 (two) times daily. 12/09/16   Gwyneth Sprout, MD  naproxen (NAPROSYN) 375 MG tablet Take 1 tablet (375 mg total) by mouth 2 (two) times daily. 12/09/16   Gwyneth Sprout, MD    Allergies    Patient has no known allergies.  Review of Systems   Review of Systems  Musculoskeletal:  Positive for arthralgias.  All other systems reviewed and are negative.  Physical  Exam Updated Vital Signs BP (!) 126/91 (BP Location: Right Arm)   Pulse 79   Temp 98.3 F (36.8 C) (Oral)   Resp 18   Ht 5\' 7"  (1.702 m)   Wt 112 kg   SpO2 94%   BMI 38.67 kg/m   Physical Exam Vitals and nursing note reviewed.  Constitutional:      General: She is not in acute distress.    Appearance: She is well-developed.  HENT:     Head: Normocephalic and atraumatic.  Eyes:     Conjunctiva/sclera: Conjunctivae normal.  Cardiovascular:     Rate and Rhythm: Normal rate and regular rhythm.     Heart sounds: No murmur heard. Pulmonary:     Effort: Pulmonary effort is normal. No respiratory distress.     Breath sounds: Normal breath sounds.  Abdominal:     Palpations: Abdomen is soft.     Tenderness: There is no abdominal tenderness.  Musculoskeletal:     Cervical back: Neck supple.     Comments: RLE: Some tenderness over the hip but no significant deformity, normal joint ROM, distal sensation, DP/PT pulse intact  Skin:    General: Skin is warm and dry.  Neurological:     General: No focal deficit present.     Mental  Status: She is alert.  Psychiatric:        Mood and Affect: Mood normal.    ED Results / Procedures / Treatments   Labs (all labs ordered are listed, but only abnormal results are displayed) Labs Reviewed - No data to display  EKG None  Radiology No results found.  Procedures Procedures   Medications Ordered in ED Medications  oxyCODONE-acetaminophen (PERCOCET/ROXICET) 5-325 MG per tablet 1 tablet (1 tablet Oral Given 03/16/21 1424)    ED Course  I have reviewed the triage vital signs and the nursing notes.  Pertinent labs & imaging results that were available during my care of the patient were reviewed by me and considered in my medical decision making (see chart for details).    MDM Rules/Calculators/A&P                           50 year old with right hip pain.  On exam well-appearing in no distress, neurovascular intact.  Vital  stable.  Will check plain films.  While awaiting results, signed out to Dr. Jacqulyn Bath.   Final Clinical Impression(s) / ED Diagnoses Final diagnoses:  Right hip pain    Rx / DC Orders ED Discharge Orders          Ordered    diclofenac Sodium (VOLTAREN) 1 % GEL  4 times daily PRN        03/16/21 1507             Milagros Loll, MD 03/30/21 0715

## 2021-03-30 NOTE — Patient Instructions (Signed)
Nice to meet you Please try compression  Please try the prednisone  Please stop the aleve   Please send me a message in MyChart with any questions or updates.  Please see me back in 2 weeks.   --Dr. Jordan Likes

## 2021-03-30 NOTE — Assessment & Plan Note (Signed)
Initial injury occurred on 9/1 while at work.  Appears to have a hamstring strain with irritation and reproduction of pain at the origin. -Counseled on home exercise therapy and supportive care. -Ace wrap. -Provided work note -Prednisone. -Could consider nitro, shockwave for physical therapy.

## 2021-04-07 ENCOUNTER — Other Ambulatory Visit (HOSPITAL_BASED_OUTPATIENT_CLINIC_OR_DEPARTMENT_OTHER): Payer: Self-pay

## 2021-04-13 ENCOUNTER — Encounter: Payer: Self-pay | Admitting: Family Medicine

## 2021-04-13 ENCOUNTER — Ambulatory Visit (INDEPENDENT_AMBULATORY_CARE_PROVIDER_SITE_OTHER): Payer: Self-pay | Admitting: Family Medicine

## 2021-04-13 ENCOUNTER — Other Ambulatory Visit: Payer: Self-pay

## 2021-04-13 DIAGNOSIS — S76311D Strain of muscle, fascia and tendon of the posterior muscle group at thigh level, right thigh, subsequent encounter: Secondary | ICD-10-CM

## 2021-04-13 NOTE — Progress Notes (Signed)
  Veronica Gaines - 50 y.o. female MRN [id number]  Date of birth: [birth year]  SUBJECTIVE:  Including CC & ROS.  No chief complaint on file.   Veronica Gaines is a 50 y.o. female that is following up for her hamstring strain after an injury at work.  She is doing well and has improvement of her pain.   Review of Systems See HPI   HISTORY: Past Medical, Surgical, Social, and Family History Reviewed & Updated per EMR.   Pertinent Historical Findings include:  History reviewed. No pertinent past medical history.  Past Surgical History:  Procedure Laterality Date   CESAREAN SECTION      History reviewed. No pertinent family history.  Social History   Socioeconomic History   Marital status: Married    Spouse name: Not on file   Number of children: Not on file   Years of education: Not on file   Highest education level: Not on file  Occupational History   Not on file  Tobacco Use   Smoking status: Every Day    Packs/day: 0.50    Types: Cigarettes   Smokeless tobacco: Never  Vaping Use   Vaping Use: Never used  Substance and Sexual Activity   Alcohol use: Yes    Comment: daily   Drug use: No   Sexual activity: Not on file  Other Topics Concern   Not on file  Social History Narrative   Not on file   Social Determinants of Health   Financial Resource Strain: Not on file  Food Insecurity: Not on file  Transportation Needs: Not on file  Physical Activity: Not on file  Stress: Not on file  Social Connections: Not on file  Intimate Partner Violence: Not on file     PHYSICAL EXAM:  VS: Ht 5\' 7"  (1.702 m)   Wt 246 lb (111.6 kg)   BMI 38.53 kg/m  Physical Exam Gen: NAD, alert, cooperative with exam, well-appearing      ASSESSMENT & PLAN:   Hamstring strain, right, subsequent encounter Injury occurred at work on 9/1.  Doing well and feels little to no pain today. -Counseled on home exercise therapy and supportive care. -Referral to physical  therapy. -Provided work note.

## 2021-04-13 NOTE — Patient Instructions (Signed)
Good to see you Please continue the heat  Please try physical therapy   Please send me a message in MyChart with any questions or updates.  Please see me back as needed.   --Dr. Jordan Likes

## 2021-04-13 NOTE — Assessment & Plan Note (Addendum)
Injury occurred at work on 9/1.  Doing well and feels little to no pain today. -Counseled on home exercise therapy and supportive care. -Referral to physical therapy. -Provided work note.

## 2022-09-20 ENCOUNTER — Emergency Department (HOSPITAL_BASED_OUTPATIENT_CLINIC_OR_DEPARTMENT_OTHER): Payer: Self-pay

## 2022-09-20 ENCOUNTER — Other Ambulatory Visit (HOSPITAL_BASED_OUTPATIENT_CLINIC_OR_DEPARTMENT_OTHER): Payer: Self-pay

## 2022-09-20 ENCOUNTER — Emergency Department (HOSPITAL_BASED_OUTPATIENT_CLINIC_OR_DEPARTMENT_OTHER)
Admission: EM | Admit: 2022-09-20 | Discharge: 2022-09-20 | Disposition: A | Discharge status: Home / Self Care | Payer: Self-pay | Attending: Emergency Medicine | Admitting: Emergency Medicine

## 2022-09-20 ENCOUNTER — Encounter (HOSPITAL_BASED_OUTPATIENT_CLINIC_OR_DEPARTMENT_OTHER): Payer: Self-pay

## 2022-09-20 DIAGNOSIS — L03115 Cellulitis of right lower limb: Secondary | ICD-10-CM | POA: Insufficient documentation

## 2022-09-20 LAB — BASIC METABOLIC PANEL
Anion gap: 9 (ref 5–15)
BUN: 9 mg/dL (ref 6–20)
CO2: 26 mmol/L (ref 22–32)
Calcium: 8.4 mg/dL — ABNORMAL LOW (ref 8.9–10.3)
Chloride: 103 mmol/L (ref 98–111)
Creatinine, Ser: 0.84 mg/dL (ref 0.44–1.00)
GFR, Estimated: >60 mL/min (ref >60)
Glucose, Bld: 86 mg/dL (ref 70–99)
Potassium: 4.2 mmol/L (ref 3.5–5.1)
Sodium: 138 mmol/L (ref 135–145)

## 2022-09-20 LAB — CBC WITH DIFFERENTIAL/PLATELET
Abs Immature Granulocytes: 0.02 10*3/uL (ref 0.00–0.07)
Basophils Absolute: 0 10*3/uL (ref 0.0–0.1)
Basophils Relative: 0 %
Eosinophils Absolute: 0.1 10*3/uL (ref 0.0–0.5)
Eosinophils Relative: 1 %
HCT: 41.3 % (ref 36.0–46.0)
Hemoglobin: 13.9 g/dL (ref 12.0–15.0)
Immature Granulocytes: 0 %
Lymphocytes Relative: 35 %
Lymphs Abs: 2.1 10*3/uL (ref 0.7–4.0)
MCH: 34.8 pg — ABNORMAL HIGH (ref 26.0–34.0)
MCHC: 33.7 g/dL (ref 30.0–36.0)
MCV: 103.3 fL — ABNORMAL HIGH (ref 80.0–100.0)
Monocytes Absolute: 0.6 10*3/uL (ref 0.1–1.0)
Monocytes Relative: 10 %
Neutro Abs: 3.2 10*3/uL (ref 1.7–7.7)
Neutrophils Relative %: 54 %
Platelets: 241 10*3/uL (ref 150–400)
RBC: 4 MIL/uL (ref 3.87–5.11)
RDW: 13.7 % (ref 11.5–15.5)
WBC: 6 10*3/uL (ref 4.0–10.5)
nRBC: 0 % (ref 0.0–0.2)

## 2022-09-20 LAB — D-DIMER, QUANTITATIVE: D-Dimer, Quant: 0.51 ug/mL-FEU — ABNORMAL HIGH (ref 0.00–0.50)

## 2022-09-20 MED ORDER — CEPHALEXIN 500 MG PO CAPS
500.0000 mg | ORAL_CAPSULE | Freq: Four times a day (QID) | ORAL | 0 refills | Status: DC
  Filled 2022-09-20: qty 28, 7d supply, fill #0

## 2022-09-20 MED ORDER — ACETAMINOPHEN 500 MG PO TABS
500.0000 mg | ORAL_TABLET | Freq: Four times a day (QID) | ORAL | 0 refills | Status: DC | PRN
  Filled 2022-09-20: qty 100, 25d supply, fill #0

## 2022-09-20 MED ORDER — NAPROXEN 500 MG PO TABS
500.0000 mg | ORAL_TABLET | Freq: Two times a day (BID) | ORAL | 0 refills | Status: DC
  Filled 2022-09-20: qty 10, 5d supply, fill #0

## 2022-09-20 MED ORDER — DOXYCYCLINE HYCLATE 100 MG PO CAPS
100.0000 mg | ORAL_CAPSULE | Freq: Two times a day (BID) | ORAL | 0 refills | Status: DC
  Filled 2022-09-20: qty 14, 7d supply, fill #0

## 2022-09-20 NOTE — ED Triage Notes (Signed)
C/o right leg swelling & redness since 2/28. Denies blood clots. States has been putting salicylic acid pad on her callous on her foot prior to symptoms starting.

## 2022-09-20 NOTE — ED Notes (Signed)
Pt in bed, pt back from x ray, water, crackers and granola bar given for po challenge.

## 2022-09-20 NOTE — ED Notes (Signed)
Pt in bed, pt has swelling and redness to r foot, less than three sec cap refill, positive sensation to touch

## 2022-09-20 NOTE — ED Provider Notes (Signed)
[place] [facility]ER[facility]Y [facility]MENT [facility] Provider Note   CSN: [id number] Arrival date & time: 09/20/22  0741     History  Chief Complaint  Patient presents with   Leg Swelling    Veronica Gaines is a 52 y.o. female.  HPI    52 year old female comes in with chief complaint of right foot swelling and pain.  Symptoms have been present now for 8 days.  Patient states that prior to the leg starting to swell, she had removed calluses from the plantar aspect of her right toe.  Over time, she has seen increased swelling and pain, particularly over the lateral aspect of the ankle.  There was no trauma.  Her job requires a lot of walking, and her pain is worsened.  She has taken over-the-counter medications, elevated her leg without significant relief.  Patient has no history of PE, DVT.  Home Medications Prior to Admission medications   Medication Sig Start Date End Date Taking? Authorizing Provider  cephALEXin (KEFLEX) 500 MG capsule Take 1 capsule (500 mg total) by mouth 4 (four) times daily. 09/20/22  Yes Varney Biles, MD  doxycycline (VIBRAMYCIN) 100 MG capsule Take 1 capsule (100 mg total) by mouth 2 (two) times daily. 09/20/22  Yes Varney Biles, MD  acetaminophen (TYLENOL) 500 MG tablet Take 1 tablet (500 mg total) by mouth every 6 (six) hours as needed. 09/20/22  Yes Varney Biles, MD  cyclobenzaprine (FLEXERIL) 10 MG tablet Take 1 tablet (10 mg total) by mouth 2 (two) times daily as needed for muscle spasms. 12/09/16   Blanchie Dessert, MD  diclofenac Sodium (VOLTAREN) 1 % GEL Apply 2 g topically 4 (four) times daily as needed. 03/16/21   Long, Wonda Olds, MD  metroNIDAZOLE (FLAGYL) 500 MG tablet Take 1 tablet (500 mg total) by mouth 2 (two) times daily. 12/09/16   Blanchie Dessert, MD  naproxen (NAPROSYN) 500 MG tablet Take 1 tablet (500 mg total) by mouth 2 (two) times daily. 09/20/22  Yes Varney Biles, MD  predniSONE (DELTASONE) 5 MG tablet Take 6 pills for first  day, 5 pills second day, 4 pills third day, 3 pills fourth day, 2 pills the fifth day, and 1 pill sixth day. 03/30/21   Rosemarie Ax, MD      Allergies    Patient has no known allergies.    Review of Systems   Review of Systems  Physical Exam Updated Vital Signs BP 110/70 (BP Location: Right Arm)   Pulse 71   Temp (!) 97.5 F (36.4 C) (Oral)   Resp 18   Ht '5\' 7"'$  (1.702 m)   Wt 99.8 kg   SpO2 98%   BMI 34.46 kg/m  Physical Exam Vitals and nursing note reviewed.  Constitutional:      Appearance: She is well-developed.  HENT:     Head: Atraumatic.  Cardiovascular:     Rate and Rhythm: Normal rate.  Pulmonary:     Effort: Pulmonary effort is normal.  Musculoskeletal:        General: Swelling and tenderness present. No deformity.     Cervical back: Normal range of motion and neck supple.     Comments: Patient's right ankle has erythema in the lateral malleoli are region is edematous.  There is tenderness to palpation in that region.  Patient able to plantar and dorsiflex actively and also passively.  She also has tenderness to palpation over the distal aspect of the lateral foot.  No crepitus.  Skin:  General: Skin is warm and dry.     Findings: Erythema present.  Neurological:     Mental Status: She is alert and oriented to person, place, and time.     ED Results / Procedures / Treatments   Labs (all labs ordered are listed, but only abnormal results are displayed) Labs Reviewed  CBC WITH DIFFERENTIAL/PLATELET - Abnormal; Notable for the following components:      Result Value   MCV 103.3 (*)    MCH 34.8 (*)    All other components within normal limits  BASIC METABOLIC PANEL - Abnormal; Notable for the following components:   Calcium 8.4 (*)    All other components within normal limits  D-DIMER, QUANTITATIVE - Abnormal; Notable for the following components:   D-Dimer, Quant 0.51 (*)    All other components within normal limits     EKG None  Radiology DG Ankle Complete Right  Result Date: 09/20/2022 CLINICAL DATA:  ankle pain and swelling EXAM: RIGHT ANKLE - COMPLETE 3+ VIEW COMPARISON:  None Available. FINDINGS: Soft tissue swelling. No evidence of acute fracture. No joint malalignment. Mild osteoarthritis. IMPRESSION: Soft tissue swelling without evidence of acute fracture or joint malalignment. Electronically Signed   By: Margaretha Sheffield M.D.   On: 09/20/2022 09:26    Procedures Procedures    Medications Ordered in ED Medications - No data to display  ED Course/ Medical Decision Making/ A&P                             Medical Decision Making Amount and/or Complexity of Data Reviewed Labs: ordered. Radiology: ordered.  Risk OTC drugs. Prescription drug management.  52 year old patient comes in with chief complaint of right leg swelling and pain. Patient has no significant past medical history.  She has no trauma.  Differential diagnosis considered for this patient includes DVT of the leg, cellulitis of the foot, septic arthritis of the ankle, ankle sprain.   Basic labs ordered.  D-dimer ordered.   11:24 AM X-ray of the foot independently interpreted.  There is no evidence of gas. I also ordered ultrasound DVT given patient's D-dimer is slightly elevated.  However patient cannot wait any further.  Outpatient DVT study has been ordered for tomorrow.  Patient will receive antibiotics.  Return precautions have been discussed.  Patient amenable to getting outpatient DVT study given that she has to leave by 11:30 AM.  Final Clinical Impression(s) / ED Diagnoses Final diagnoses:  Cellulitis of right lower extremity    Rx / DC Orders ED Discharge Orders          Ordered    Lower Ext Right Venous US       Comments: IMPORTANT PATIENT INSTRUCTIONS:  You have been scheduled for an Outpatient Ultrasound.    Your appointment has been scheduled for:  _______ am/pm on _______________ (date).  If  your appointment is scheduled for a Saturday, Sunday or holiday, please go to the [facility] Emergency Department Registration Desk at least 15 minutes prior to your appointment time and tell them you are there for an ultrasound.    If your appointment is scheduled for a weekday (Monday - Friday), please go directly to the [facility] Radiology Department reception area at least 15 minutes prior to your appointment time and tell them you are there for an ultrasound.  Please call [phone] with questions.   09/20/22 1115    doxycycline (VIBRAMYCIN)  100 MG capsule  2 times daily        09/20/22 1116    cephALEXin (KEFLEX) 500 MG capsule  4 times daily        09/20/22 1116    naproxen (NAPROSYN) 500 MG tablet  2 times daily        09/20/22 1116    acetaminophen (TYLENOL) 500 MG tablet  Every 6 hours PRN        09/20/22 1116              Varney Biles, MD 09/20/22 1125

## 2022-09-20 NOTE — Discharge Instructions (Signed)
You are seen in the ER for leg swelling.  It appears to [place] that most likely have cellulitis.  However your screening test for blood clot evaluation is positive, mandating that we get ultrasound to rule out a blood clot.  Unfortunately, you cannot wait any further to get the ultrasound done in the ER today therefore outpatient DVT study has been ordered.  Instructions on how to complete the DVT study has also been provided.  Start taking the antibiotics that are prescribed. Return to the emergency room in 3 days if your symptoms are getting worse.

## 2022-09-20 NOTE — ED Notes (Signed)
Pt in bed, pt states that she needs to go because her ride has to leave for a doctors appointment, md aware, plan to have outpatient ultrasound, pt sitting up in bed, pt reports 5/10 pain, states that she is ready to go home, reviewed outpatient ultrasound importance and procedure, pt verbalized understanding, d/c pt iv, reviewed crutch education, pt ambulatory from dpt on crutches.

## 2022-09-21 ENCOUNTER — Other Ambulatory Visit (HOSPITAL_BASED_OUTPATIENT_CLINIC_OR_DEPARTMENT_OTHER): Payer: Self-pay | Admitting: Emergency Medicine

## 2022-09-21 ENCOUNTER — Ambulatory Visit (HOSPITAL_BASED_OUTPATIENT_CLINIC_OR_DEPARTMENT_OTHER)
Admission: RE | Admit: 2022-09-21 | Discharge: 2022-09-21 | Disposition: A | Discharge status: Home / Self Care | Payer: Self-pay | Source: Ambulatory Visit | Attending: Emergency Medicine | Admitting: Emergency Medicine

## 2022-09-21 DIAGNOSIS — L03115 Cellulitis of right lower limb: Secondary | ICD-10-CM

## 2022-09-23 ENCOUNTER — Emergency Department (HOSPITAL_BASED_OUTPATIENT_CLINIC_OR_DEPARTMENT_OTHER)
Admission: EM | Admit: 2022-09-23 | Discharge: 2022-09-23 | Disposition: A | Discharge status: Home / Self Care | Payer: Self-pay | Attending: Emergency Medicine | Admitting: Emergency Medicine

## 2022-09-23 ENCOUNTER — Other Ambulatory Visit: Payer: Self-pay

## 2022-09-23 ENCOUNTER — Encounter (HOSPITAL_BASED_OUTPATIENT_CLINIC_OR_DEPARTMENT_OTHER): Payer: Self-pay | Admitting: Emergency Medicine

## 2022-09-23 DIAGNOSIS — M25571 Pain in right ankle and joints of right foot: Secondary | ICD-10-CM

## 2022-09-23 DIAGNOSIS — L03115 Cellulitis of right lower limb: Secondary | ICD-10-CM | POA: Insufficient documentation

## 2022-09-23 NOTE — ED Notes (Signed)
Discharge paperwork reviewed entirely with patient, including Rx's and follow up care. Pain was under control. Pt verbalized understanding as well as all parties involved. No questions or concerns voiced at the time of discharge. No acute distress noted.   Pt ambulated out to PVA without incident or assistance.  

## 2022-09-23 NOTE — Discharge Instructions (Addendum)
Thank you for allowing me to be a part of your care today.  You were sent home in a CAM boot to help support your foot.  You may remove this to shower and while you are resting at home.  You can use the Voltaren gel topically to your ankle.   Continue to take your antibiotics as prescribed.  I also recommend elevating your foot and using cold/cool compresses to your ankle.   Return to the ED if you have worsening of your symptoms.

## 2022-09-23 NOTE — ED Provider Notes (Signed)
[place] [facility] Provider Note   CSN: [id number] Arrival date & time: 09/23/22  1326     History  Chief Complaint  Patient presents with   Ankle Pain    Veronica Gaines is a 52 y.o. female presents to the ED with request for supportive device for her ankle as well as possible pain cream to apply.  Patient was recently seen for right lower extremity cellulitis around the ankle and was placed on antibiotic treatment.  She reports that the swelling and pain have significantly improved, but she would like to have a walking boot instead of using a crutch because she needs to return to work.  Denies fever, chills, numbness, weakness.       Home Medications Prior to Admission medications   Medication Sig Start Date End Date Taking? Authorizing Provider  acetaminophen (TYLENOL) 500 MG tablet Take 1 tablet (500 mg total) by mouth every 6 (six) hours as needed. 09/20/22   Varney Biles, MD  cephALEXin (KEFLEX) 500 MG capsule Take 1 capsule (500 mg total) by mouth 4 (four) times daily. 09/20/22   Varney Biles, MD  cyclobenzaprine (FLEXERIL) 10 MG tablet Take 1 tablet (10 mg total) by mouth 2 (two) times daily as needed for muscle spasms. 12/09/16   Blanchie Dessert, MD  diclofenac Sodium (VOLTAREN) 1 % GEL Apply 2 g topically 4 (four) times daily as needed. 03/16/21   Long, Wonda Olds, MD  doxycycline (VIBRAMYCIN) 100 MG capsule Take 1 capsule (100 mg total) by mouth 2 (two) times daily. 09/20/22   Varney Biles, MD  metroNIDAZOLE (FLAGYL) 500 MG tablet Take 1 tablet (500 mg total) by mouth 2 (two) times daily. 12/09/16   Blanchie Dessert, MD  naproxen (NAPROSYN) 500 MG tablet Take 1 tablet (500 mg total) by mouth 2 (two) times daily. 09/20/22   Varney Biles, MD  predniSONE (DELTASONE) 5 MG tablet Take 6 pills for first day, 5 pills second day, 4 pills third day, 3 pills fourth day, 2 pills the fifth day, and 1 pill sixth day. 03/30/21   Rosemarie Ax,  MD      Allergies    Patient has no known allergies.    Review of Systems   Review of Systems  Constitutional:  Negative for chills and fever.  Musculoskeletal:  Positive for arthralgias and joint swelling.  Neurological:  Negative for weakness and numbness.    Physical Exam Updated Vital Signs BP 101/81   Pulse 71   Temp 98.4 F (36.9 C) (Oral)   Resp 16   SpO2 99%  Physical Exam Vitals and nursing note reviewed.  Constitutional:      General: She is not in acute distress.    Appearance: Normal appearance. She is not ill-appearing or diaphoretic.  Cardiovascular:     Rate and Rhythm: Normal rate and regular rhythm.  Pulmonary:     Effort: Pulmonary effort is normal.  Musculoskeletal:     Right ankle: Swelling present. Tenderness present. Decreased range of motion. Normal pulse.     Comments: Patient does have swelling and erythema around the right ankle.  Very mild increased warmth when compared to the left side.  Patient reports that this has significantly improved since she began antibiotic therapy for her cellulitis.  DP pulse is 2+.  She does have mildly decreased range of motion due to swelling and discomfort.  Neurological:     Mental Status: She is alert. Mental status is at baseline.  Psychiatric:        Mood and Affect: Mood normal.        Behavior: Behavior normal.     ED Results / Procedures / Treatments   Labs (all labs ordered are listed, but only abnormal results are displayed) Labs Reviewed - No data to display  EKG None  Radiology US Venous Img Lower Unilateral Right (DVT)  Result Date: 09/21/2022 CLINICAL DATA:  Right lower extremity edema and elevated D-dimer. EXAM: RIGHT LOWER EXTREMITY VENOUS DOPPLER ULTRASOUND TECHNIQUE: Gray-scale sonography with graded compression, as well as color Doppler and duplex ultrasound were performed to evaluate the lower extremity deep venous systems from the level of the common femoral vein and including the common  femoral, femoral, profunda femoral, popliteal and calf veins including the posterior tibial, peroneal and gastrocnemius veins when visible. The superficial great saphenous vein was also interrogated. Spectral Doppler was utilized to evaluate flow at rest and with distal augmentation maneuvers in the common femoral, femoral and popliteal veins. COMPARISON:  None Available. FINDINGS: Contralateral Common Femoral Vein: Respiratory phasicity is normal and symmetric with the symptomatic side. No evidence of thrombus. Normal compressibility. Common Femoral Vein: No evidence of thrombus. Normal compressibility, respiratory phasicity and response to augmentation. Saphenofemoral Junction: No evidence of thrombus. Normal compressibility and flow on color Doppler imaging. Profunda Femoral Vein: No evidence of thrombus. Normal compressibility and flow on color Doppler imaging. Femoral Vein: No evidence of thrombus. Normal compressibility, respiratory phasicity and response to augmentation. Popliteal Vein: No evidence of thrombus. Normal compressibility, respiratory phasicity and response to augmentation. Calf Veins: No evidence of thrombus. Normal compressibility and flow on color Doppler imaging. Superficial Great Saphenous Vein: No evidence of thrombus. Normal compressibility. Venous Reflux:  None. Other Findings: No evidence of superficial thrombophlebitis or abnormal fluid collection. IMPRESSION: No evidence of right lower extremity deep venous thrombosis. Electronically Signed   By: Aletta Edouard M.D.   On: 09/21/2022 16:32    Procedures Procedures    Medications Ordered in ED Medications - No data to display  ED Course/ Medical Decision Making/ A&P                             Medical Decision Making  This patient presents to the ED with chief complaint(s) of right ankle swelling and pain that has improved since time antibiotic therapy, and need a work note and supportive device for ankle with pertinent  past medical history of cellulitis of right ankle.  The complaint involves an extensive differential diagnosis and also carries with it a high risk of complications and morbidity.    Initial Assessment:   Exam significant for swelling and erythema around the right ankle with very increased warmth when compared to the left side.  Patient reports that this has significantly improved since she began antibiotic therapy for her cellulitis.  Patient was evaluated in the ED and started on antibiotics.  She reports she is just here so that she can receive some type of walking boot so she can return to work as well as a note for work.  She is also requesting a topical cream to help apply to this area.  She states that she used Voltaren initially before cellulitis diagnosis and felt that it "made it worse".  Treatment and Reassessment: Patient was fitted for a short cam walking boot successfully and she was able to ambulate without difficulty.  Discussed topical Voltaren should be safe for her to  use now that she is being treated for cellulitis.  Discussed that she should remain off of her feet is much as possible until her condition has improved.  Patient was provided with a work note.   Disposition:   The patient has been appropriately medically screened and/or stabilized in the ED. I have low suspicion for any other emergent medical condition which would require further screening, evaluation or treatment in the ED or require inpatient management. At time of discharge the patient is hemodynamically stable and in no acute distress. I have discussed work-up results and diagnosis with patient and answered all questions. Patient is agreeable with discharge plan. We discussed strict return precautions for returning to the emergency department and they verbalized understanding.            Final Clinical Impression(s) / ED Diagnoses Final diagnoses:  Acute right ankle pain  Cellulitis of right ankle    Rx  / DC Orders ED Discharge Orders     None         Pat Kocher, [place] 09/23/22 1934    Wyvonnia Dusky, MD 09/24/22 0830

## 2022-09-23 NOTE — ED Triage Notes (Addendum)
Pt reports continued ankle pain worse with bending. Reports swelling has decreased. Xrays and ultrasound from previous visit were negative. Pt asking for some sort of supportive device to go back to work since she cannot go back to work with crutches. Also request pain cream and work note.

## 2022-10-23 ENCOUNTER — Encounter (HOSPITAL_BASED_OUTPATIENT_CLINIC_OR_DEPARTMENT_OTHER): Payer: Self-pay | Admitting: Urology

## 2022-10-23 ENCOUNTER — Emergency Department (HOSPITAL_BASED_OUTPATIENT_CLINIC_OR_DEPARTMENT_OTHER): Payer: Self-pay

## 2022-10-23 ENCOUNTER — Emergency Department (HOSPITAL_BASED_OUTPATIENT_CLINIC_OR_DEPARTMENT_OTHER)
Admission: EM | Admit: 2022-10-23 | Discharge: 2022-10-23 | Disposition: A | Discharge status: Home / Self Care | Payer: Self-pay | Attending: Emergency Medicine | Admitting: Emergency Medicine

## 2022-10-23 ENCOUNTER — Other Ambulatory Visit (HOSPITAL_BASED_OUTPATIENT_CLINIC_OR_DEPARTMENT_OTHER): Payer: Self-pay

## 2022-10-23 DIAGNOSIS — S82831A Other fracture of upper and lower end of right fibula, initial encounter for closed fracture: Secondary | ICD-10-CM | POA: Insufficient documentation

## 2022-10-23 DIAGNOSIS — X58XXXA Exposure to other specified factors, initial encounter: Secondary | ICD-10-CM | POA: Insufficient documentation

## 2022-10-23 MED ORDER — HYDROCODONE-ACETAMINOPHEN 5-325 MG PO TABS
1.0000 | ORAL_TABLET | Freq: Four times a day (QID) | ORAL | 0 refills | Status: DC | PRN
  Filled 2022-10-23: qty 10, 3d supply, fill #0

## 2022-10-23 NOTE — Discharge Instructions (Addendum)
You have been diagnosed with a right fibular fracture.  Please call and follow-up closely with orthopedics for further management of your condition.  You may take over-the-counter Tylenol or ibuprofen as needed for pain.  You may take Vicodin as needed if the pain is intense but be aware it can cause drowsiness.  Keep your leg elevated at rest.

## 2022-10-23 NOTE — ED Provider Notes (Addendum)
[place] [facility] Provider Note   CSN: [id number] Arrival date & time: 10/23/22  1129     History  Chief Complaint  Patient presents with   Ankle Pain    Veronica Gaines is a 52 y.o. female.  The history is provided by the patient and medical records. No language interpreter was used.  Ankle Pain    52 year old female presenting for complaining of ankle pain.  Patient report more than a month ago she stepped on a curb and injured her right ankle.  She also recall using a callus remover to remove some of the callus on the foot around the same time.  She noticed pain and swelling and was seen and evaluated in the ED for her complaint.  She reports an x-ray was done and was negative.  Subsequently she still having pain and had a DVT study obtain which was negative as well.  She was giving a boot to wear, and was prescribed antibiotic.  She also was given work note for light duty.  Patient states she has to do a lot of walking at work and despite taking all the medication that was prescribed, she still endorse having pain to her right ankle worsening with movement.  She wears a walking boot at work, but not at home as it is irritating to her skin.  She does not endorse any chest pain or shortness of breath.  No fever or chills no numbness.  Home Medications Prior to Admission medications   Medication Sig Start Date End Date Taking? Authorizing Provider  acetaminophen (TYLENOL) 500 MG tablet Take 1 tablet (500 mg total) by mouth every 6 (six) hours as needed. 09/20/22   Derwood Kaplan, MD  cephALEXin (KEFLEX) 500 MG capsule Take 1 capsule (500 mg total) by mouth 4 (four) times daily. 09/20/22   Derwood Kaplan, MD  cyclobenzaprine (FLEXERIL) 10 MG tablet Take 1 tablet (10 mg total) by mouth 2 (two) times daily as needed for muscle spasms. 12/09/16   Gwyneth Sprout, MD  diclofenac Sodium (VOLTAREN) 1 % GEL Apply 2 g topically 4 (four) times daily as  needed. 03/16/21   Long, Arlyss Repress, MD  doxycycline (VIBRAMYCIN) 100 MG capsule Take 1 capsule (100 mg total) by mouth 2 (two) times daily. 09/20/22   Derwood Kaplan, MD  metroNIDAZOLE (FLAGYL) 500 MG tablet Take 1 tablet (500 mg total) by mouth 2 (two) times daily. 12/09/16   Gwyneth Sprout, MD  naproxen (NAPROSYN) 500 MG tablet Take 1 tablet (500 mg total) by mouth 2 (two) times daily. 09/20/22   Derwood Kaplan, MD  predniSONE (DELTASONE) 5 MG tablet Take 6 pills for first day, 5 pills second day, 4 pills third day, 3 pills fourth day, 2 pills the fifth day, and 1 pill sixth day. 03/30/21   Myra Rude, MD      Allergies    Patient has no known allergies.    Review of Systems   Review of Systems  All other systems reviewed and are negative.   Physical Exam Updated Vital Signs BP 133/70 (BP Location: Right Arm)   Pulse 65   Temp 97.8 F (36.6 C) (Oral)   Resp 16   Ht 5\' 7"  (1.702 m)   Wt 99.8 kg   SpO2 100%   BMI 34.46 kg/m  Physical Exam Vitals and nursing note reviewed.  Constitutional:      General: She is not in acute distress.    Appearance: She  is well-developed. She is obese.  HENT:     Head: Atraumatic.  Eyes:     Conjunctiva/sclera: Conjunctivae normal.  Pulmonary:     Effort: Pulmonary effort is normal.  Musculoskeletal:        General: Tenderness (Right ankle: Edema and warmth appreciated to the lateral malleoli region without any crepitus and ankle with full range of motion and other that similar pain.  Dorsalis pedis pulse is palpable with brisk cap refill.) present.     Cervical back: Neck supple.  Skin:    Findings: No rash.  Neurological:     Mental Status: She is alert.  Psychiatric:        Mood and Affect: Mood normal.     ED Results / Procedures / Treatments   Labs (all labs ordered are listed, but only abnormal results are displayed) Labs Reviewed - No data to display  EKG None  Radiology DG Ankle Complete Right  Result Date:  10/23/2022 CLINICAL DATA:  Continued pain and swelling lateral right ankle. EXAM: RIGHT ANKLE - COMPLETE 3+ VIEW COMPARISON:  Right ankle radiographs 09/20/2022 FINDINGS: No acute fracture is visualized on the prior 09/20/2022 right ankle radiographs. On the current radiographs, there is new horizontal linear lucency indicating an acute to subacute fracture with minimal 1-2 mm lateral displacement of the distal fracture component with respect to the proximal fracture component. Mild lateral greater than medial ankle soft tissue swelling. The ankle mortise is symmetric and intact. Small plantar calcaneal heel spur. Mild dorsal talonavicular degenerative spurring. IMPRESSION: 1. Acute to subacute fracture of the distal fibula. There is essentially nondisplaced, with up to 2 mm cortical step-off. 2. Mild lateral greater than medial ankle soft tissue swelling. Electronically Signed   By: Neita Garnet M.D.   On: 10/23/2022 12:36    Procedures Procedures    Medications Ordered in ED Medications - No data to display  ED Course/ Medical Decision Making/ A&P                             Medical Decision Making Amount and/or Complexity of Data Reviewed Radiology: ordered.  Risk Prescription drug management.   BP 133/70 (BP Location: Right Arm)   Pulse 65   Temp 97.8 F (36.6 C) (Oral)   Resp 16   Ht 5\' 7"  (1.702 m)   Wt 99.8 kg   SpO2 100%   BMI 34.46 kg/m   23:54 PM 52 year old female presenting for complaining of ankle pain.  Patient report more than a month ago she stepped on a curb and injured her right ankle.  She also recall using a callus remover to remove some of the callus on the foot around the same time.  She noticed pain and swelling and was seen and evaluated in the ED for her complaint.  She reports an x-ray was done and was negative.  Subsequently she still having pain and had a DVT study obtain which was negative as well.  She was giving a boot to wear, and was prescribed  antibiotic.  She also was given work note for light duty.  Patient states she has to do a lot of walking at work and despite taking all the medication that was prescribed, she still endorse having pain to her right ankle worsening with movement.  She wears a walking boot at work, but not at home as it is irritating to her skin.  She does not endorse any chest  pain or shortness of breath.  No fever or chills no numbness.  On exam this is an obese female laying bed appears to be in no acute discomfort.  Right ankle notable for mild edema and warmth noted to the lateral malleoli region with tenderness to palpation.  She is able to range her ankle with some discomfort.  She has intact dorsalis pedis pulse with brisk cap refill.  She does not have any tenderness to her calf region and there is no streaking erythema.  EMR reviewed patient has been seen evaluate multiple times for her complaint.  She has had negative x-ray of the right ankle as well as negative DVT study.  She was given antiviral medication as well as antibiotic for which she has been taking as prescribed but states it has not fully resolved her symptoms.  She does have a cam walker boot available.  Plan to obtain a repeat x-ray to assess for potential occult fracture.  I have low suspicion for cellulitis or DVT causing her symptoms.  I have low suspicion for septic joint.  She is neurovascular intact doubt claudication or ischemic leg.  12:56 PM Repeat x-ray of the right ankle was obtained independently viewed and interpreted by me and I agree with radiologist interpretation.  X-ray demonstrate an acute to subacute fracture of the distal fibula this is essentially a nondisplaced fracture.  This is a closed injury.  Will place patient in a short leg splint and will give patient referral to orthopedics for outpatient management of her injury.  Work note provided.  Pain medication offered but patient declined.  Will discharge home with opiate pain  medication use as needed.  1:45 PM Splint was placed, on post splinting, patient is neurovascular intact and tolerates splint well.      Final Clinical Impression(s) / ED Diagnoses Final diagnoses:  Closed fracture of distal end of right fibula, unspecified fracture morphology, initial encounter    Rx / DC Orders ED Discharge Orders          Ordered    HYDROcodone-acetaminophen (NORCO/VICODIN) 5-325 MG tablet  Every 6 hours PRN        10/23/22 1300              Fayrene Helperran, Kylina Vultaggio, PA-C 10/23/22 1302    Rolan BuccoBelfi, Melanie, MD 10/23/22 1328    Fayrene Helperran, Valen Mascaro, PA-C 10/23/22 1346    Rolan BuccoBelfi, Melanie, MD 10/23/22 [phone]

## 2022-10-23 NOTE — ED Notes (Signed)
Discharge paperwork reviewed entirely with patient, including Rx's and follow up care. Pain was under control. Pt verbalized understanding as well as all parties involved. No questions or concerns voiced at the time of discharge. No acute distress noted.   Pt ambulated out to PVA without incident or assistance.  

## 2022-10-23 NOTE — ED Triage Notes (Signed)
Pt states was seen 3/10 for cellulitis to right ankle, taken full course of antibiotics and feel like swelling is better but still having pain in lateral right ankle Wearing walking boot when up at work but not at home

## 2022-10-30 ENCOUNTER — Other Ambulatory Visit (HOSPITAL_BASED_OUTPATIENT_CLINIC_OR_DEPARTMENT_OTHER): Payer: Self-pay

## 2022-11-01 ENCOUNTER — Encounter: Payer: Self-pay | Admitting: *Deleted

## 2022-11-18 ENCOUNTER — Encounter (HOSPITAL_BASED_OUTPATIENT_CLINIC_OR_DEPARTMENT_OTHER): Payer: Self-pay | Admitting: Emergency Medicine

## 2022-11-18 ENCOUNTER — Emergency Department (HOSPITAL_BASED_OUTPATIENT_CLINIC_OR_DEPARTMENT_OTHER): Payer: Self-pay

## 2022-11-18 ENCOUNTER — Other Ambulatory Visit: Payer: Self-pay

## 2022-11-18 ENCOUNTER — Emergency Department (HOSPITAL_BASED_OUTPATIENT_CLINIC_OR_DEPARTMENT_OTHER)
Admission: EM | Admit: 2022-11-18 | Discharge: 2022-11-18 | Disposition: A | Discharge status: Home / Self Care | Payer: Self-pay | Attending: Emergency Medicine | Admitting: Emergency Medicine

## 2022-11-18 DIAGNOSIS — S82831G Other fracture of upper and lower end of right fibula, subsequent encounter for closed fracture with delayed healing: Secondary | ICD-10-CM

## 2022-11-18 DIAGNOSIS — X58XXXD Exposure to other specified factors, subsequent encounter: Secondary | ICD-10-CM | POA: Insufficient documentation

## 2022-11-18 DIAGNOSIS — S89301G Unspecified physeal fracture of lower end of right fibula, subsequent encounter for fracture with delayed healing: Secondary | ICD-10-CM | POA: Insufficient documentation

## 2022-11-18 NOTE — Discharge Instructions (Signed)
Your cast was removed today due to damage and you were placed in a splint. It is important that you do not bear weight on the right lower extremity. It is also important that you follow up with orthopedics as the splint is a temporary measure to stabilize the bone.

## 2022-11-18 NOTE — ED Notes (Signed)
Patient's leg was wrapped in a bag due to the wet conditions outside.

## 2022-11-18 NOTE — ED Triage Notes (Signed)
Pt reports she was diagnosed with a broken able some time ago. Had cast from past 3 weeks, but want reevaluation to get work note so she can go back to work. Says ankle feels better.

## 2022-11-18 NOTE — ED Provider Notes (Signed)
[facility] Provider Note   CSN: [id number] Arrival date & time: 11/18/22  1044     History  Chief Complaint  Patient presents with   Ankle Pain    Veronica Gaines is a 52 y.o. female.  Patient presents to the emergency department for reevaluation of a right-sided fibular fracture.  Patient states she has been in a hard cast for the past 3 weeks and is feeling better at this time.  She is hoping to get a note to clear her for return to work.  She does endorse weightbearing on occasion as she has had to continue working throughout the time of her injury.  She states that the cast is "nasty" and is broken on the bottom.  She has been trying to use crutches when she is able at work.  She was advised to consider getting a knee scooter but states she was unable to afford the same.  No relevant past medical history on file  HPI     Home Medications Prior to Admission medications   Medication Sig Start Date End Date Taking? Authorizing Provider  acetaminophen (TYLENOL) 500 MG tablet Take 1 tablet (500 mg total) by mouth every 6 (six) hours as needed. 09/20/22   Derwood Kaplan, MD  cephALEXin (KEFLEX) 500 MG capsule Take 1 capsule (500 mg total) by mouth 4 (four) times daily. 09/20/22   Derwood Kaplan, MD  cyclobenzaprine (FLEXERIL) 10 MG tablet Take 1 tablet (10 mg total) by mouth 2 (two) times daily as needed for muscle spasms. 12/09/16   Gwyneth Sprout, MD  diclofenac Sodium (VOLTAREN) 1 % GEL Apply 2 g topically 4 (four) times daily as needed. 03/16/21   Long, Arlyss Repress, MD  doxycycline (VIBRAMYCIN) 100 MG capsule Take 1 capsule (100 mg total) by mouth 2 (two) times daily. 09/20/22   Derwood Kaplan, MD  HYDROcodone-acetaminophen (NORCO/VICODIN) 5-325 MG tablet Take 1 tablet by mouth every 6 (six) hours as needed for moderate pain. 10/23/22   Fayrene Helper, PA-C  metroNIDAZOLE (FLAGYL) 500 MG tablet Take 1 tablet (500 mg total) by mouth 2 (two) times  daily. 12/09/16   Gwyneth Sprout, MD  naproxen (NAPROSYN) 500 MG tablet Take 1 tablet (500 mg total) by mouth 2 (two) times daily. 09/20/22   Derwood Kaplan, MD  predniSONE (DELTASONE) 5 MG tablet Take 6 pills for first day, 5 pills second day, 4 pills third day, 3 pills fourth day, 2 pills the fifth day, and 1 pill sixth day. 03/30/21   Myra Rude, MD      Allergies    Patient has no known allergies.    Review of Systems   Review of Systems  Physical Exam Updated Vital Signs BP (!) 123/91 (BP Location: Left Arm)   Pulse 79   Temp 98.1 F (36.7 C) (Oral)   Resp 16   SpO2 99%  Physical Exam Vitals and nursing note reviewed.  HENT:     Head: Normocephalic and atraumatic.  Eyes:     Conjunctiva/sclera: Conjunctivae normal.  Cardiovascular:     Rate and Rhythm: Normal rate.  Pulmonary:     Effort: Pulmonary effort is normal. No respiratory distress.  Musculoskeletal:        General: Signs of injury present.     Cervical back: Normal range of motion.     Comments: Patient in hard cast upon arrival, right lower leg. Patient able to move toes, normal sensation, brisk cap refill  Skin:  General: Skin is dry.  Neurological:     Mental Status: She is alert.  Psychiatric:        Speech: Speech normal.        Behavior: Behavior normal.     ED Results / Procedures / Treatments   Labs (all labs ordered are listed, but only abnormal results are displayed) Labs Reviewed - No data to display  EKG None  Radiology DG Ankle Complete Right  Result Date: 11/18/2022 CLINICAL DATA:  Re-evaluate right fibular fracture, EXAM: RIGHT ANKLE - COMPLETE 3+ VIEW COMPARISON:  10/23/2022 and 09/20/2022. FINDINGS: Ankle now all encased in a fiberglass cast. There is been some interval resorption along the fracture margins of the transverse fracture of the distal fibula. No callus formation or bony bridging. Fracture line is traverse across the metadiaphysis. Fracture alignment is  unchanged. No new fracture. Ankle joint normally spaced and aligned. IMPRESSION: 1. No evident change in fracture alignment. There has been some interval resorption along the fracture margins, but no bony bridging or obvious callus formation. No change in the ankle joint alignment. Electronically Signed   By: Amie Portland M.D.   On: 11/18/2022 11:48    Procedures .Ortho Injury Treatment  Date/Time: 11/18/2022 12:12 PM  Performed by: Darrick Grinder, PA-C Authorized by: Darrick Grinder, PA-C   Consent:    Consent obtained:  Verbal   Consent given by:  Patient   Risks discussed:  Vascular damage, stiffness, restricted joint movement, nerve damage, recurrent dislocation, irreducible dislocation and fracture   Alternatives discussed:  No treatment and alternative treatmentInjury location: lower leg Location details: right lower leg Injury type: fracture Fracture type: fibular shaft Pre-procedure neurovascular assessment: neurovascularly intact Immobilization: splint Splint type: short leg Splint Applied by: ED Tech Post-procedure neurovascular assessment: post-procedure neurovascularly intact       Medications Ordered in ED Medications - No data to display  ED Course/ Medical Decision Making/ A&P                             Medical Decision Making Amount and/or Complexity of Data Reviewed Radiology: ordered.   Patient presents the emergency department with concerns over a known right fibular fracture.  I ordered and interpreted imaging including plain films of the right ankle which showed no change in fracture alignment.  Shows some possible resorption but no callus formation.  Continues to have fracture in the distal right fibula.  I agree with the radiologist findings  I discussed the patient's condition with the patient.  She states that she was told to follow-up with orthopedics but was unable to due to finances.  I explained to the patient that we are unable to provide  definitive treatment here in the emergency department for this fracture and that she needs to remain nonweightbearing.  The fracture is likely not showing signs of healing because the patient has continued to bear weight on the right lower extremity. The cast was removed as it was damaged and likely not maintaining integrity at this time.  She was placed in a short leg splint as noted in procedure note.  The patient is to continue using crutches and follow-up with orthopedics.  Patient voices understanding with plan.        Final Clinical Impression(s) / ED Diagnoses Final diagnoses:  Closed fracture of distal end of right fibula with delayed healing, unspecified fracture morphology, subsequent encounter    Rx / DC Orders ED  Discharge Orders     None         Pamala Duffel 11/18/22 1250    Glynn Octave, MD 11/18/22 [phone]

## 2022-11-18 NOTE — ED Notes (Signed)
ED Provider at bedside to remove cast with cast cutter

## 2023-06-28 IMAGING — CR DG HIP (WITH OR WITHOUT PELVIS) 2-3V*R*
3 series · 3 of 3 positions shown · non-contrast
Comparison: None

CLINICAL DATA: RIGHT hip pain, braced herself for a fall on [REDACTED]
and felt a pop in her RIGHT buttocks

EXAM:
DG HIP (WITH OR WITHOUT PELVIS) 2-3V RIGHT

[t pelvis a.p.]
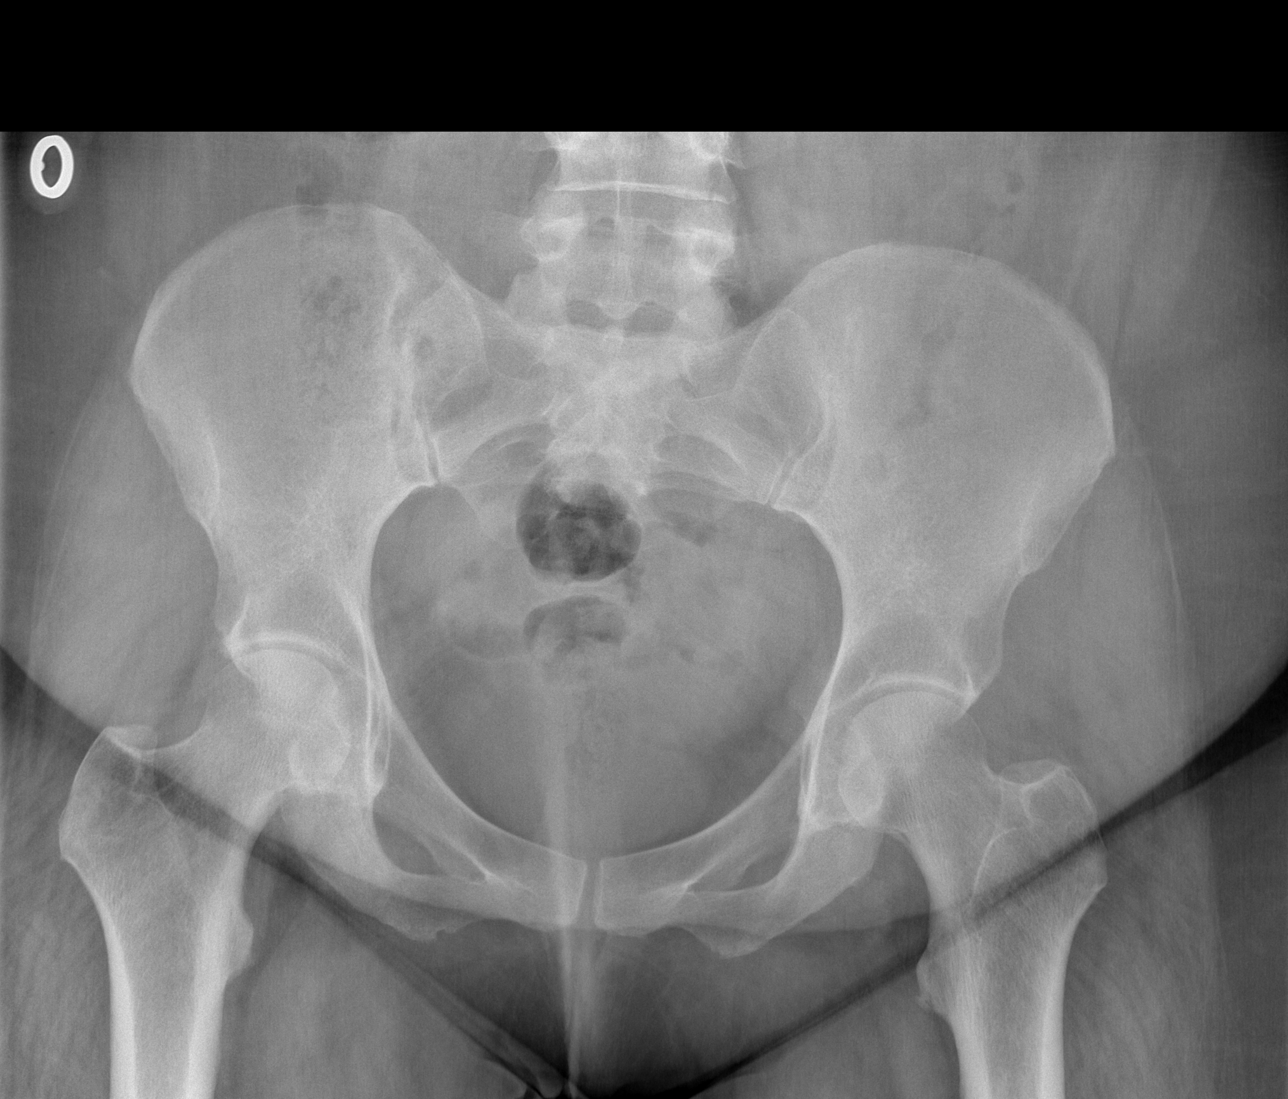

[t hip ap right]
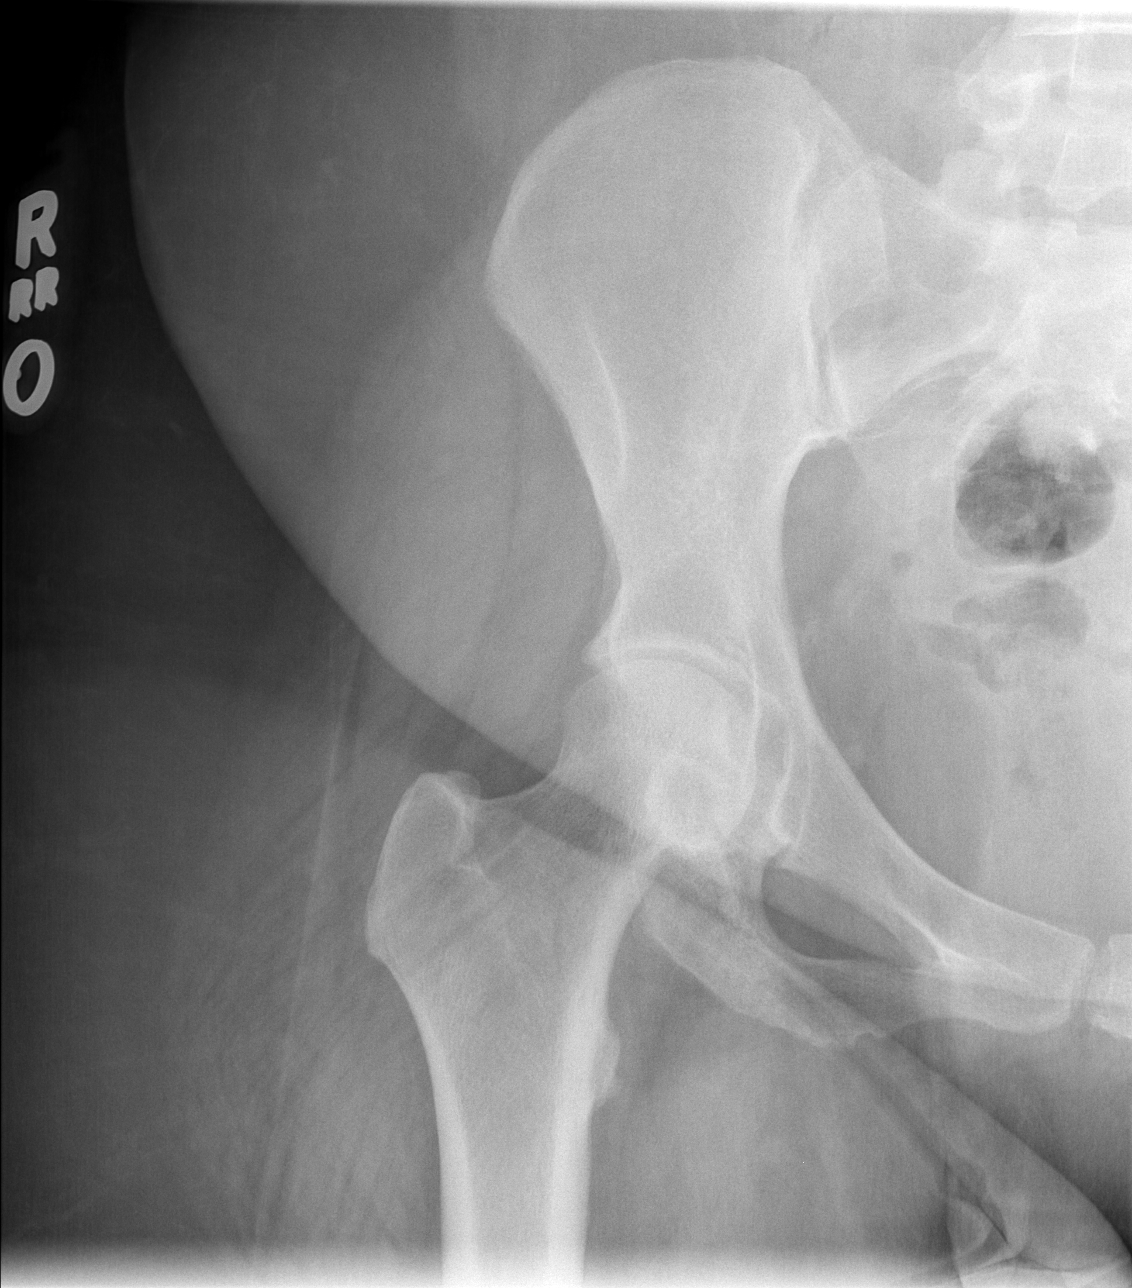

[t hip frog leg right]
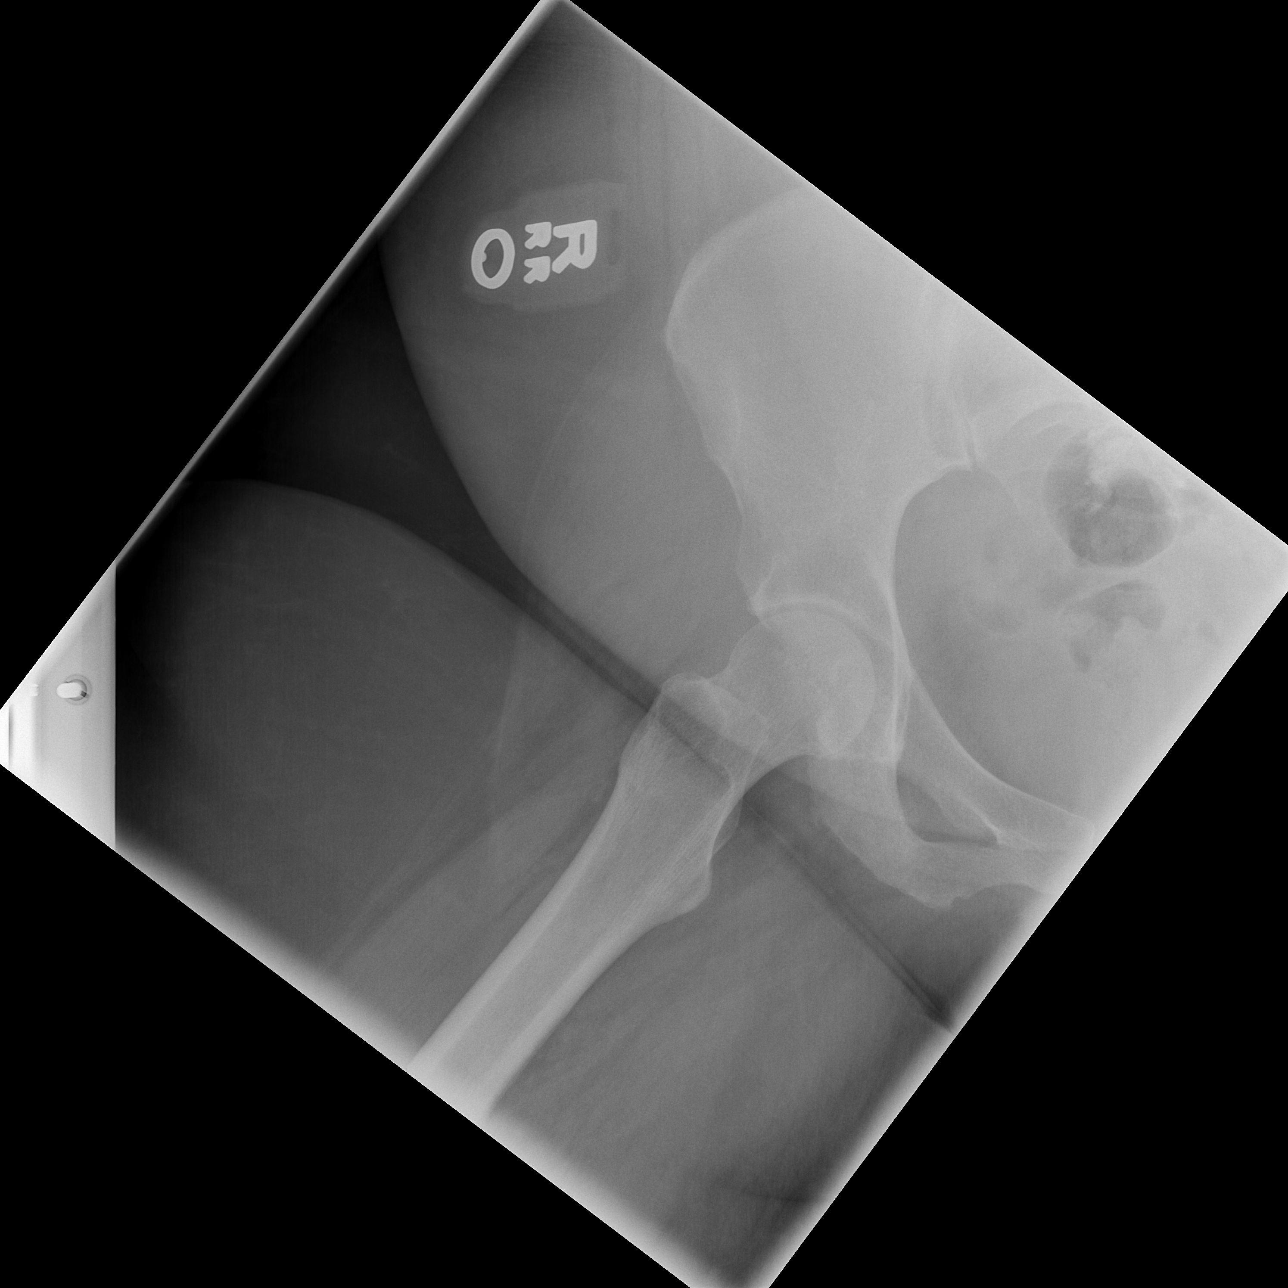

[3 of 3 positions shown; findings below may reference images not displayed]

FINDINGS: Minimal narrowing of the hip joints bilaterally.

SI joints preserved.

Osseous mineralization normal for technique.

No acute fracture, dislocation, or bone destruction.
IMPRESSION: Minimal degenerative changes of the hip joints.

No acute abnormalities.

## 2024-02-25 ENCOUNTER — Other Ambulatory Visit: Payer: Self-pay

## 2024-02-25 ENCOUNTER — Emergency Department (HOSPITAL_BASED_OUTPATIENT_CLINIC_OR_DEPARTMENT_OTHER): Payer: Self-pay

## 2024-02-25 ENCOUNTER — Encounter (HOSPITAL_BASED_OUTPATIENT_CLINIC_OR_DEPARTMENT_OTHER): Payer: Self-pay | Admitting: Urology

## 2024-02-25 ENCOUNTER — Emergency Department (HOSPITAL_BASED_OUTPATIENT_CLINIC_OR_DEPARTMENT_OTHER)
Admission: EM | Admit: 2024-02-25 | Discharge: 2024-02-25 | Disposition: A | Discharge status: Home / Self Care | Payer: Self-pay

## 2024-02-25 DIAGNOSIS — G6289 Other specified polyneuropathies: Secondary | ICD-10-CM | POA: Insufficient documentation

## 2024-02-25 DIAGNOSIS — F1010 Alcohol abuse, uncomplicated: Secondary | ICD-10-CM | POA: Diagnosis not present

## 2024-02-25 DIAGNOSIS — G629 Polyneuropathy, unspecified: Secondary | ICD-10-CM | POA: Diagnosis present

## 2024-02-25 LAB — COMPREHENSIVE METABOLIC PANEL WITH GFR
ALT: 15 U/L (ref 0–44)
AST: 37 U/L (ref 15–41)
Albumin: 3.8 g/dL (ref 3.5–5.0)
Alkaline Phosphatase: 94 U/L (ref 38–126)
Anion gap: 16 — ABNORMAL HIGH (ref 5–15)
BUN: 9 mg/dL (ref 6–20)
CO2: 22 mmol/L (ref 22–32)
Calcium: 9.3 mg/dL (ref 8.9–10.3)
Chloride: 99 mmol/L (ref 98–111)
Creatinine, Ser: 0.66 mg/dL (ref 0.44–1.00)
GFR, Estimated: >60 mL/min (ref >60)
Glucose, Bld: 78 mg/dL (ref 70–99)
Potassium: 3.8 mmol/L (ref 3.5–5.1)
Sodium: 137 mmol/L (ref 135–145)
Total Bilirubin: 0.6 mg/dL (ref 0.0–1.2)
Total Protein: 7.4 g/dL (ref 6.5–8.1)

## 2024-02-25 LAB — CBC WITH DIFFERENTIAL/PLATELET
Abs Immature Granulocytes: 0.01 K/uL (ref 0.00–0.07)
Basophils Absolute: 0 K/uL (ref 0.0–0.1)
Basophils Relative: 0 %
Eosinophils Absolute: 0 K/uL (ref 0.0–0.5)
Eosinophils Relative: 1 %
HCT: 36.6 % (ref 36.0–46.0)
Hemoglobin: 12.8 g/dL (ref 12.0–15.0)
Immature Granulocytes: 0 %
Lymphocytes Relative: 35 %
Lymphs Abs: 1.9 K/uL (ref 0.7–4.0)
MCH: 39.9 pg — ABNORMAL HIGH (ref 26.0–34.0)
MCHC: 35 g/dL (ref 30.0–36.0)
MCV: 114 fL — ABNORMAL HIGH (ref 80.0–100.0)
Monocytes Absolute: 0.4 K/uL (ref 0.1–1.0)
Monocytes Relative: 7 %
Neutro Abs: 3.1 K/uL (ref 1.7–7.7)
Neutrophils Relative %: 57 %
Platelets: 215 K/uL (ref 150–400)
RBC: 3.21 MIL/uL — ABNORMAL LOW (ref 3.87–5.11)
RDW: 12.9 % (ref 11.5–15.5)
WBC: 5.4 K/uL (ref 4.0–10.5)
nRBC: 0 % (ref 0.0–0.2)

## 2024-02-25 LAB — CBG MONITORING, ED: Glucose-Capillary: 79 mg/dL (ref 70–99)

## 2024-02-25 LAB — MAGNESIUM: Magnesium: 2.2 mg/dL (ref 1.7–2.4)

## 2024-02-25 MED ORDER — THIAMINE HCL 100 MG/ML IJ SOLN: 100.0000 mg | Freq: Every day | INTRAMUSCULAR | Status: DC

## 2024-02-25 MED ORDER — THIAMINE MONONITRATE 100 MG PO TABS
100.0000 mg | ORAL_TABLET | Freq: Every day | ORAL | Status: DC
  Administered 2024-02-25 (×2): 100 mg via ORAL
  Filled 2024-02-25: qty 1

## 2024-02-25 NOTE — ED Notes (Signed)
 Patient transported to CT

## 2024-02-25 NOTE — Discharge Instructions (Addendum)
 Please start taking a daily over-the-counter multivitamin.  Please seek help for your alcohol abuse.  You may talk with your primary doctor or go to the behavioral health urgent care center.  Return if develop sudden onset headache, vision loss, facial droop, difficulty finding her words, seizures, chest pain, shortness of breath or any new or worsening symptoms that are concerning to you.

## 2024-02-25 NOTE — ED Triage Notes (Signed)
 Pt ambulatory to exam room  States bilateral hand numbness and legs and feet pain that started 2 weeks ago States pain to mouth and throat and behind ears  H/o abscess tooth per pt with no antibiotics  Also reports pain in back and right shoulder blade

## 2024-02-25 NOTE — ED Notes (Signed)
 Pt d/c home per EDP order. Discharge summary reviewed, pt verbalizes understanding. Ambulatory off unit. NAD.

## 2024-02-25 NOTE — ED Notes (Addendum)
 Error in documentation

## 2024-02-25 NOTE — ED Provider Notes (Signed)
 [facility] Provider Note   CSN: [id number] Arrival date & time: 02/25/24  1139     Patient presents with: Multiple Complaints   Veronica Gaines is a 53 y.o. female.   53 year old female presenting emergency department with peripheral neuropathy to her bilateral hands and legs has been worsening with past several weeks.  No headache, vision loss, facial droop, no chest pain or shortness of breath.  No weakness.  Notes pins and needle sensation.        Prior to Admission medications   Medication Sig Start Date End Date Taking? Authorizing Provider  acetaminophen  (TYLENOL ) 500 MG tablet Take 1 tablet (500 mg total) by mouth every 6 (six) hours as needed. 09/20/22   Charlyn Sora, MD  cephALEXin  (KEFLEX ) 500 MG capsule Take 1 capsule (500 mg total) by mouth 4 (four) times daily. 09/20/22   Charlyn Sora, MD  cyclobenzaprine  (FLEXERIL ) 10 MG tablet Take 1 tablet (10 mg total) by mouth 2 (two) times daily as needed for muscle spasms. 12/09/16   Doretha Folks, MD  diclofenac  Sodium (VOLTAREN ) 1 % GEL Apply 2 g topically 4 (four) times daily as needed. 03/16/21   Long, Joshua G, MD  doxycycline  (VIBRAMYCIN ) 100 MG capsule Take 1 capsule (100 mg total) by mouth 2 (two) times daily. 09/20/22   Charlyn Sora, MD  HYDROcodone -acetaminophen  (NORCO/VICODIN) 5-325 MG tablet Take 1 tablet by mouth every 6 (six) hours as needed for moderate pain. 10/23/22   Nivia Colon, PA-C  metroNIDAZOLE  (FLAGYL ) 500 MG tablet Take 1 tablet (500 mg total) by mouth 2 (two) times daily. 12/09/16   Doretha Folks, MD  naproxen  (NAPROSYN ) 500 MG tablet Take 1 tablet (500 mg total) by mouth 2 (two) times daily. 09/20/22   Charlyn Sora, MD  predniSONE  (DELTASONE ) 5 MG tablet Take 6 pills for first day, 5 pills second day, 4 pills third day, 3 pills fourth day, 2 pills the fifth day, and 1 pill sixth day. 03/30/21   Chick Venetia BRAVO, MD    Allergies: Patient has no known  allergies.    Review of Systems  Updated Vital Signs BP (!) 148/107 (BP Location: Right Arm)   Pulse 90   Temp 98.3 F (36.8 C) (Oral)   Resp 18   Ht 5' 7 (1.702 m)   Wt 99.8 kg   SpO2 100%   BMI 34.46 kg/m   Physical Exam Vitals and nursing note reviewed.  Constitutional:      General: She is not in acute distress.    Appearance: She is not toxic-appearing.  HENT:     Head: Normocephalic.     Mouth/Throat:     Mouth: Mucous membranes are moist.     Pharynx: Oropharynx is clear.  Eyes:     Extraocular Movements: Extraocular movements intact.     Conjunctiva/sclera: Conjunctivae normal.  Cardiovascular:     Rate and Rhythm: Normal rate and regular rhythm.     Pulses: Normal pulses.  Pulmonary:     Effort: Pulmonary effort is normal.  Abdominal:     General: Abdomen is flat. There is no distension.     Tenderness: There is no abdominal tenderness. There is no guarding or rebound.  Musculoskeletal:        General: Normal range of motion.  Skin:    General: Skin is warm and dry.     Capillary Refill: Capillary refill takes less than 2 seconds.  Neurological:     Mental  Status: She is alert and oriented to person, place, and time.     Comments: Subjective paresthesia to bilateral upper extremities and lower extremities mid forearm into the mid shin.  Equal strength.  Cranial nerves intact.  No dysarthria or dysphagia.  Psychiatric:        Mood and Affect: Mood normal.        Behavior: Behavior normal.     (all labs ordered are listed, but only abnormal results are displayed) Labs Reviewed  CBC WITH DIFFERENTIAL/PLATELET - Abnormal; Notable for the following components:      Result Value   RBC 3.21 (*)    MCV 114.0 (*)    MCH 39.9 (*)    All other components within normal limits  COMPREHENSIVE METABOLIC PANEL WITH GFR - Abnormal; Notable for the following components:   Anion gap 16 (*)    All other components within normal limits  MAGNESIUM  RPR  CBG  MONITORING, ED    EKG: None  Radiology: CT Cervical Spine Wo Contrast Result Date: 02/25/2024 CLINICAL DATA:  53 year old female with headache, mouth, throat, and neck pain, bilateral extremity pain and numbness. EXAM: CT CERVICAL SPINE WITHOUT CONTRAST TECHNIQUE: Multidetector CT imaging of the cervical spine was performed without intravenous contrast. Multiplanar CT image reconstructions were also generated. RADIATION DOSE REDUCTION: This exam was performed according to the departmental dose-optimization program which includes automated exposure control, adjustment of the mA and/or kV according to patient size and/or use of iterative reconstruction technique. COMPARISON:  Head CT today. FINDINGS: Alignment: Straightening and subtle reversal of cervical lordosis. Cervicothoracic junction alignment is within normal limits. Bilateral posterior element alignment is within normal limits. Skull base and vertebrae: Visualized skull base is intact. No atlanto-occipital dissociation. C1 and C2 appear intact and aligned. No acute osseous abnormality identified. Soft tissues and spinal canal: No prevertebral fluid or swelling. No visible canal hematoma. Negative visible noncontrast neck soft tissues. Disc levels: Mild for age cervical spine degeneration. No spinal or cervical foraminal stenosis by CT. Upper chest: Negative visible noncontrast thoracic inlet. IMPRESSION: Negative CT appearance of the Cervical spine. Mild for age cervical spine degeneration. Electronically Signed   By: VEAR Hurst M.D.   On: 02/25/2024 13:07   CT Head Wo Contrast Result Date: 02/25/2024 CLINICAL DATA:  53 year old female with headache, mouth and throat pain, bilateral extremity pain and numbness. EXAM: CT HEAD WITHOUT CONTRAST TECHNIQUE: Contiguous axial images were obtained from the base of the skull through the vertex without intravenous contrast. RADIATION DOSE REDUCTION: This exam was performed according to the departmental  dose-optimization program which includes automated exposure control, adjustment of the mA and/or kV according to patient size and/or use of iterative reconstruction technique. COMPARISON:  Head CT 12/08/2019. FINDINGS: Brain: Cerebral volume is stable and normal for age. No midline shift, ventriculomegaly, mass effect, evidence of mass lesion, intracranial hemorrhage or evidence of cortically based acute infarction. Small but circumscribed and linear posterior left cerebellar infarct appears chronic (series 2, image 9 and series 601, image 21) but is new since 2021. Elsewhere gray-white differentiation appears stable and within normal limits for age. Vascular: No suspicious intracranial vascular hyperdensity. Skull: Chronic left lamina papyracea fracture. No acute osseous abnormality identified. Sinuses/Orbits: Partially visible new right maxillary sinus opacification since 2021, mild right anterior ethmoid mucosal thickening is new since 2021. Other Visualized paranasal sinuses and mastoids are stable and well aerated. Other: No acute orbit or scalp soft tissue finding. IMPRESSION: 1. No acute intracranial abnormality. 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 2. Partially visible right OMC obstructive pattern of paranasal sinus disease, new since 2021. Chronic left lamina papyracea fracture. Electronically Signed   By: VEAR Hurst M.D.   On: 02/25/2024 13:04     Procedures   Medications Ordered in the ED  thiamine  (VITAMIN B1) tablet 100 mg (has no administration in time range)    Or  thiamine  (VITAMIN B1) injection 100 mg (has no administration in time range)    Clinical Course as of 02/25/24 1334  Tue Feb 25, 2024  1314 CT Head Wo Contrast IMPRESSION: 1. No acute intracranial abnormality. 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 2. Partially visible right O[facility] obstructive pattern of paranasal sinus disease, new since 2021. Chronic left lamina  papyracea fracture.   Electronically Signed   By: VEAR Hurst M.D.   On: 02/25/2024 13:04   [TY]  1314 CT Cervical Spine Wo Contrast IMPRESSION: Negative CT appearance of the Cervical spine. Mild for age cervical spine degeneration.   Electronically Signed   By: VEAR Hurst M.D.   On: 02/25/2024 13:07   [TY]  1314 Comprehensive metabolic panel(!) No significant metabolic derangements.  Normal kidney function [TY]  1314 Magnesium: 2.2 [TY]  1315 CBC with Differential(!) No leukocytosis to suggest infectious process.  Does have macrocytosis, but no anemia [TY]  1324 Discussed results with patient's after discussing her macrocytosis she does note that she drinks 6 airplane bottles a day.  Question whether or not her peripheral neuropathy is secondary to nutritional source secondary to her alcohol use and/or thiamine /folate deficiency. Will give dose of thiamine  here.   [TY]    Clinical Course User Index [TY] Neysa Caron PARAS, DO                                 Medical Decision Making This is a 53 year old female presenting emergency department with peripheral neuropathy/paresthesias.  No significant past medical history per chart review.  She is afebrile nontachycardic, slightly hypertensive.  Physical exam reassuring with no localizing deficits.  Symptoms seemingly subacute and worsening over the past several weeks.  Broad workup largely reassuring.  CT head and C-spine without central cause.  Labs with no metabolic derangements that would explain her paresthesias.  Normal magnesium, normal calcium.  No leukocytosis to suggest infectious process.  She is not having any weakness to suggest an ascending demyelinating disease such as Guillain-Barr and no recent infections.  Given thiamine  here.  Discussed cessation of alcohol with aid of PCP or behavioral health urgent care center.  Also strongly recommended taking a daily multivitamin.  Will discharge in stable condition, will also have patient  follow-up with neurology in case symptoms do not improve with above.  Stable for discharge at this time  Amount and/or Complexity of Data Reviewed Labs: ordered. Decision-making details documented in ED Course. Radiology: ordered. Decision-making details documented in ED Course.  Risk OTC drugs. Prescription drug management. Decision regarding hospitalization. Diagnosis or treatment significantly limited by social determinants of health. Risk Details: Alcohol abuse      Final diagnoses:  Other polyneuropathy  Alcohol abuse    ED Discharge Orders     None          Neysa Caron PARAS, DO 02/25/24 1334

## 2024-02-26 LAB — RPR: RPR Ser Ql: NONREACTIVE

## 2024-04-10 ENCOUNTER — Other Ambulatory Visit: Payer: Self-pay

## 2024-04-10 ENCOUNTER — Emergency Department (HOSPITAL_COMMUNITY): Admission: EM | Admit: 2024-04-10 | Discharge: 2024-04-10 | Discharge status: Against Medical Advice | Payer: Self-pay

## 2024-04-10 ENCOUNTER — Encounter (HOSPITAL_COMMUNITY): Payer: Self-pay | Admitting: *Deleted

## 2024-04-10 DIAGNOSIS — R531 Weakness: Secondary | ICD-10-CM | POA: Diagnosis not present

## 2024-04-10 DIAGNOSIS — M542 Cervicalgia: Secondary | ICD-10-CM | POA: Diagnosis not present

## 2024-04-10 DIAGNOSIS — Z5321 Procedure and treatment not carried out due to patient leaving prior to being seen by health care provider: Secondary | ICD-10-CM | POA: Diagnosis not present

## 2024-04-10 DIAGNOSIS — R200 Anesthesia of skin: Secondary | ICD-10-CM | POA: Diagnosis present

## 2024-04-10 DIAGNOSIS — R2233 Localized swelling, mass and lump, upper limb, bilateral: Secondary | ICD-10-CM | POA: Insufficient documentation

## 2024-04-10 LAB — CBC WITH DIFFERENTIAL/PLATELET
Basophils Absolute: 0 K/uL (ref 0.0–0.1)
Basophils Relative: 0 %
Eosinophils Absolute: 0.1 K/uL (ref 0.0–0.5)
Eosinophils Relative: 2 %
HCT: 38.9 % (ref 36.0–46.0)
Hemoglobin: 13.1 g/dL (ref 12.0–15.0)
Lymphocytes Relative: 30 %
Lymphs Abs: 1.6 K/uL (ref 0.7–4.0)
MCH: 38.5 pg — ABNORMAL HIGH (ref 26.0–34.0)
MCHC: 33.7 g/dL (ref 30.0–36.0)
MCV: 114.4 fL — ABNORMAL HIGH (ref 80.0–100.0)
Monocytes Absolute: 0.6 K/uL (ref 0.1–1.0)
Monocytes Relative: 12 %
Neutro Abs: 3 K/uL (ref 1.7–7.7)
Neutrophils Relative %: 56 %
Platelets: 201 K/uL (ref 150–400)
RBC: 3.4 MIL/uL — ABNORMAL LOW (ref 3.87–5.11)
RDW: 12.9 % (ref 11.5–15.5)
WBC: 5.3 K/uL (ref 4.0–10.5)
nRBC: 0 % (ref 0.0–0.2)

## 2024-04-10 LAB — COMPREHENSIVE METABOLIC PANEL WITH GFR
ALT: 21 U/L (ref 0–44)
AST: 33 U/L (ref 15–41)
Albumin: 3.3 g/dL — ABNORMAL LOW (ref 3.5–5.0)
Alkaline Phosphatase: 61 U/L (ref 38–126)
Anion gap: 15 (ref 5–15)
BUN: 7 mg/dL (ref 6–20)
CO2: 26 mmol/L (ref 22–32)
Calcium: 9.1 mg/dL (ref 8.9–10.3)
Chloride: 96 mmol/L — ABNORMAL LOW (ref 98–111)
Creatinine, Ser: 0.71 mg/dL (ref 0.44–1.00)
GFR, Estimated: >60 mL/min (ref >60)
Glucose, Bld: 78 mg/dL (ref 70–99)
Potassium: 3 mmol/L — ABNORMAL LOW (ref 3.5–5.1)
Sodium: 137 mmol/L (ref 135–145)
Total Bilirubin: 0.7 mg/dL (ref 0.0–1.2)
Total Protein: 7.4 g/dL (ref 6.5–8.1)

## 2024-04-10 NOTE — ED Triage Notes (Signed)
 Dental absecss in march  pain in both her arms and hands with a painful area to the back of her neck she is also c/o swelling in both arms and  hands she has also had numbness in both her legs for several weeks   she fell this am no feeling in her knees   she has not seen a medical doctor

## 2024-04-10 NOTE — ED Notes (Signed)
 PT called for vital reassessment x3

## 2024-04-10 NOTE — ED Notes (Signed)
Pt called for vitals reassessment, no response.  

## 2024-04-10 NOTE — ED Provider Triage Note (Signed)
 Emergency Medicine Provider Triage Evaluation Note  Veronica Gaines , a 53 y.o. female  was evaluated in triage.  Pt complains of bilateral arm and leg weakness. Patient says she had a dental abscess that spontaneously ruptured back in March and believes these symptoms are a result of that. She came in today because she fell at home, witnessed by her son. Did not hit her head, no LOC. No dizziness. No headache. Some neck and pain pain tender to palpation. Sensation diminished on left leg.  Review of Systems  Positive: Leg numbness, arm numbness Negative: Chest pain, shortness of breath, headache  Physical Exam  BP 113/82   Pulse 92   Temp 98.1 F (36.7 C)   Resp 18   Ht 5' 7 (1.702 m)   Wt 99.8 kg   SpO2 92%   BMI 34.46 kg/m  Gen:   Awake, no distress   Resp:  Normal effort  MSK:   Moves extremities without difficulty. Strength 5/5 on bilateral upper extemities. Sensation equal on bilateral upper extremities. Sensation diminished, but intact on left lower extremity. Other:   Medical Decision Making  Medically screening exam initiated at 5:13 PM.  Appropriate orders placed.  Veronica Gaines was informed that the remainder of the evaluation will be completed by another provider, this initial triage assessment does not replace that evaluation, and the importance of remaining in the ED until their evaluation is complete.    Veronica Gaines, [place] 04/10/24 1717

## 2024-04-12 ENCOUNTER — Inpatient Hospital Stay (HOSPITAL_BASED_OUTPATIENT_CLINIC_OR_DEPARTMENT_OTHER)
Admission: EM | Admit: 2024-04-12 | Discharge: 2024-04-15 | DRG: 641 | Disposition: A | Discharge status: Home Health | Payer: MEDICAID | Attending: Family Medicine | Admitting: Family Medicine

## 2024-04-12 ENCOUNTER — Emergency Department (HOSPITAL_BASED_OUTPATIENT_CLINIC_OR_DEPARTMENT_OTHER): Payer: Self-pay

## 2024-04-12 ENCOUNTER — Other Ambulatory Visit: Payer: Self-pay

## 2024-04-12 ENCOUNTER — Encounter (HOSPITAL_BASED_OUTPATIENT_CLINIC_OR_DEPARTMENT_OTHER): Payer: Self-pay

## 2024-04-12 DIAGNOSIS — F17210 Nicotine dependence, cigarettes, uncomplicated: Secondary | ICD-10-CM | POA: Diagnosis present

## 2024-04-12 DIAGNOSIS — Z8673 Personal history of transient ischemic attack (TIA), and cerebral infarction without residual deficits: Secondary | ICD-10-CM

## 2024-04-12 DIAGNOSIS — R270 Ataxia, unspecified: Secondary | ICD-10-CM | POA: Diagnosis present

## 2024-04-12 DIAGNOSIS — D529 Folate deficiency anemia, unspecified: Secondary | ICD-10-CM | POA: Diagnosis present

## 2024-04-12 DIAGNOSIS — F10139 Alcohol abuse with withdrawal, unspecified: Secondary | ICD-10-CM | POA: Diagnosis not present

## 2024-04-12 DIAGNOSIS — E876 Hypokalemia: Secondary | ICD-10-CM | POA: Diagnosis present

## 2024-04-12 DIAGNOSIS — W19XXXA Unspecified fall, initial encounter: Secondary | ICD-10-CM | POA: Diagnosis present

## 2024-04-12 DIAGNOSIS — Z79899 Other long term (current) drug therapy: Secondary | ICD-10-CM

## 2024-04-12 DIAGNOSIS — M542 Cervicalgia: Secondary | ICD-10-CM | POA: Diagnosis present

## 2024-04-12 DIAGNOSIS — R29898 Other symptoms and signs involving the musculoskeletal system: Secondary | ICD-10-CM

## 2024-04-12 DIAGNOSIS — G629 Polyneuropathy, unspecified: Principal | ICD-10-CM | POA: Diagnosis present

## 2024-04-12 DIAGNOSIS — Z5971 Insufficient health insurance coverage: Secondary | ICD-10-CM

## 2024-04-12 DIAGNOSIS — D519 Vitamin B12 deficiency anemia, unspecified: Secondary | ICD-10-CM | POA: Diagnosis present

## 2024-04-12 DIAGNOSIS — G8929 Other chronic pain: Secondary | ICD-10-CM | POA: Diagnosis present

## 2024-04-12 DIAGNOSIS — K0889 Other specified disorders of teeth and supporting structures: Principal | ICD-10-CM

## 2024-04-12 DIAGNOSIS — K047 Periapical abscess without sinus: Secondary | ICD-10-CM | POA: Diagnosis present

## 2024-04-12 DIAGNOSIS — F10939 Alcohol use, unspecified with withdrawal, unspecified: Secondary | ICD-10-CM | POA: Diagnosis present

## 2024-04-12 DIAGNOSIS — E538 Deficiency of other specified B group vitamins: Principal | ICD-10-CM | POA: Diagnosis present

## 2024-04-12 DIAGNOSIS — R531 Weakness: Secondary | ICD-10-CM | POA: Diagnosis present

## 2024-04-12 DIAGNOSIS — R296 Repeated falls: Secondary | ICD-10-CM | POA: Diagnosis present

## 2024-04-12 HISTORY — DX: Alcohol abuse, uncomplicated: F10.10

## 2024-04-12 HISTORY — DX: Dizziness and giddiness: R42

## 2024-04-12 HISTORY — DX: Polyneuropathy, unspecified: G62.9

## 2024-04-12 HISTORY — DX: Anemia, unspecified: D64.9

## 2024-04-12 LAB — CBC WITH DIFFERENTIAL/PLATELET
Abs Immature Granulocytes: 0.02 K/uL (ref 0.00–0.07)
Basophils Absolute: 0 K/uL (ref 0.0–0.1)
Basophils Relative: 0 %
Eosinophils Absolute: 0 K/uL (ref 0.0–0.5)
Eosinophils Relative: 0 %
HCT: 40.9 % (ref 36.0–46.0)
Hemoglobin: 14 g/dL (ref 12.0–15.0)
Immature Granulocytes: 0 %
Lymphocytes Relative: 23 %
Lymphs Abs: 1.1 K/uL (ref 0.7–4.0)
MCH: 38.5 pg — ABNORMAL HIGH (ref 26.0–34.0)
MCHC: 34.2 g/dL (ref 30.0–36.0)
MCV: 112.4 fL — ABNORMAL HIGH (ref 80.0–100.0)
Monocytes Absolute: 0.3 K/uL (ref 0.1–1.0)
Monocytes Relative: 6 %
Neutro Abs: 3.3 K/uL (ref 1.7–7.7)
Neutrophils Relative %: 71 %
Platelets: 234 K/uL (ref 150–400)
RBC: 3.64 MIL/uL — ABNORMAL LOW (ref 3.87–5.11)
RDW: 12.7 % (ref 11.5–15.5)
WBC: 4.7 K/uL (ref 4.0–10.5)
nRBC: 0 % (ref 0.0–0.2)

## 2024-04-12 LAB — COMPREHENSIVE METABOLIC PANEL WITH GFR
ALT: 26 U/L (ref 0–44)
AST: 43 U/L — ABNORMAL HIGH (ref 15–41)
Albumin: 4.4 g/dL (ref 3.5–5.0)
Alkaline Phosphatase: 86 U/L (ref 38–126)
Anion gap: 16 — ABNORMAL HIGH (ref 5–15)
BUN: 7 mg/dL (ref 6–20)
CO2: 29 mmol/L (ref 22–32)
Calcium: 10 mg/dL (ref 8.9–10.3)
Chloride: 93 mmol/L — ABNORMAL LOW (ref 98–111)
Creatinine, Ser: 0.61 mg/dL (ref 0.44–1.00)
GFR, Estimated: >60 mL/min (ref >60)
Glucose, Bld: 90 mg/dL (ref 70–99)
Potassium: 3.2 mmol/L — ABNORMAL LOW (ref 3.5–5.1)
Sodium: 138 mmol/L (ref 135–145)
Total Bilirubin: 0.6 mg/dL (ref 0.0–1.2)
Total Protein: 8.2 g/dL — ABNORMAL HIGH (ref 6.5–8.1)

## 2024-04-12 MED ORDER — LORAZEPAM 2 MG/ML IJ SOLN: 0.0000 mg | Freq: Two times a day (BID) | INTRAMUSCULAR | Status: DC

## 2024-04-12 MED ORDER — THIAMINE HCL 100 MG/ML IJ SOLN
250.0000 mg | Freq: Every day | INTRAVENOUS | Status: DC
  Filled 2024-04-12 (×3): qty 2.5

## 2024-04-12 MED ORDER — ONDANSETRON HCL 4 MG/2ML IJ SOLN
4.0000 mg | Freq: Once | INTRAMUSCULAR | Status: AC
  Administered 2024-04-12: 4 mg via INTRAVENOUS
  Filled 2024-04-12: qty 2

## 2024-04-12 MED ORDER — FOLIC ACID 1 MG PO TABS
1.0000 mg | ORAL_TABLET | Freq: Once | ORAL | Status: AC
  Administered 2024-04-12: 1 mg via ORAL
  Filled 2024-04-12: qty 1

## 2024-04-12 MED ORDER — LORAZEPAM 2 MG/ML IJ SOLN: 0.0000 mg | Freq: Four times a day (QID) | INTRAMUSCULAR | Status: AC

## 2024-04-12 MED ORDER — THIAMINE HCL 100 MG/ML IJ SOLN
500.0000 mg | Freq: Three times a day (TID) | INTRAVENOUS | Status: AC
  Administered 2024-04-12 – 2024-04-14 (×6): 500 mg via INTRAVENOUS
  Filled 2024-04-12 (×2): qty 5
  Filled 2024-04-12: qty 466.67
  Filled 2024-04-12: qty 500
  Filled 2024-04-12: qty 466.67

## 2024-04-12 MED ORDER — LORAZEPAM 1 MG PO TABS: 0.0000 mg | ORAL_TABLET | Freq: Two times a day (BID) | ORAL | Status: DC

## 2024-04-12 MED ORDER — LORAZEPAM 1 MG PO TABS
0.0000 mg | ORAL_TABLET | Freq: Four times a day (QID) | ORAL | Status: AC
  Administered 2024-04-12: 1 mg via ORAL
  Filled 2024-04-12 (×2): qty 1

## 2024-04-12 MED ORDER — THIAMINE HCL 100 MG/ML IJ SOLN: 100.0000 mg | Freq: Every day | INTRAMUSCULAR | Status: DC

## 2024-04-12 NOTE — ED Notes (Signed)
 IV obtained, but blood return was extremely slow and eventually stopped; straight stick x 1 w/o success

## 2024-04-12 NOTE — ED Notes (Signed)
 Pt c/o nausea and is dry heaving; EDP notified

## 2024-04-12 NOTE — ED Notes (Signed)
 Another RN trying to get labs at this time

## 2024-04-12 NOTE — ED Triage Notes (Signed)
 Loss of balance continues. Neck pain and right upper dental abscess Pain hands, arms, feet, and knees. X 3 weeks Pt has been taking B12, magnesium, and benadryl 

## 2024-04-12 NOTE — ED Provider Notes (Signed)
 [facility] Provider Note   CSN: [id number] Arrival date & time: 04/12/24  1607     Patient presents with: Dental Pain and loss of balance   Veronica Gaines is a 53 y.o. female.   Patient here for mostly for difficulty with ambulation.  She has been dealing with the last several weeks difficulty walking and she has got neuropathy numbness tingling in all of her extremities.  She is barely able to walk and gain her balance per family member.  She is not eating much.  She is drinking alcohol daily.  She has been started on B12 magnesium without much improvement.  She does not endorse any chest pain or shortness of breath.  She does states she eats food but family member states she does not really eat that much at all.  She is having some neck discomfort the last several weeks as well.  But no headaches.  She has tingling in all of her extremities.  She feels weak in her legs and arms.  She supposed to follow-up with neurology but her appointment is in December.  She has a history of alcohol abuse and polyneuropathy.  She has got somewhat chronic dental pain as well.  The history is provided by the patient.       Prior to Admission medications   Medication Sig Start Date End Date Taking? Authorizing Provider  acetaminophen  (TYLENOL ) 500 MG tablet Take 1 tablet (500 mg total) by mouth every 6 (six) hours as needed. 09/20/22   Charlyn Sora, MD  cephALEXin  (KEFLEX ) 500 MG capsule Take 1 capsule (500 mg total) by mouth 4 (four) times daily. 09/20/22   Charlyn Sora, MD  cyclobenzaprine  (FLEXERIL ) 10 MG tablet Take 1 tablet (10 mg total) by mouth 2 (two) times daily as needed for muscle spasms. 12/09/16   Doretha Folks, MD  diclofenac  Sodium (VOLTAREN ) 1 % GEL Apply 2 g topically 4 (four) times daily as needed. 03/16/21   Long, Joshua G, MD  doxycycline  (VIBRAMYCIN ) 100 MG capsule Take 1 capsule (100 mg total) by mouth 2 (two) times daily. 09/20/22    Charlyn Sora, MD  HYDROcodone -acetaminophen  (NORCO/VICODIN) 5-325 MG tablet Take 1 tablet by mouth every 6 (six) hours as needed for moderate pain. 10/23/22   Nivia Colon, PA-C  metroNIDAZOLE  (FLAGYL ) 500 MG tablet Take 1 tablet (500 mg total) by mouth 2 (two) times daily. 12/09/16   Doretha Folks, MD  naproxen  (NAPROSYN ) 500 MG tablet Take 1 tablet (500 mg total) by mouth 2 (two) times daily. 09/20/22   Charlyn Sora, MD  predniSONE  (DELTASONE ) 5 MG tablet Take 6 pills for first day, 5 pills second day, 4 pills third day, 3 pills fourth day, 2 pills the fifth day, and 1 pill sixth day. 03/30/21   Chick Venetia BRAVO, MD    Allergies: Patient has no known allergies.    Review of Systems  Updated Vital Signs BP (!) 121/94   Pulse 78   Temp 97.6 F (36.4 C) (Oral)   Resp 20   Wt 102.1 kg   SpO2 100%   BMI 35.24 kg/m   Physical Exam Vitals and nursing note reviewed.  Constitutional:      General: She is not in acute distress.    Appearance: She is well-developed. She is not ill-appearing.  HENT:     Head: Normocephalic and atraumatic.     Comments: Dental caries on exam but no obvious abscess trismus or drooling  Nose: Nose normal.     Mouth/Throat:     Mouth: Mucous membranes are moist.  Eyes:     Extraocular Movements: Extraocular movements intact.     Conjunctiva/sclera: Conjunctivae normal.     Pupils: Pupils are equal, round, and reactive to light.  Cardiovascular:     Rate and Rhythm: Normal rate and regular rhythm.     Pulses: Normal pulses.     Heart sounds: No murmur heard. Pulmonary:     Effort: Pulmonary effort is normal. No respiratory distress.     Breath sounds: Normal breath sounds.  Abdominal:     Palpations: Abdomen is soft.     Tenderness: There is no abdominal tenderness.  Musculoskeletal:        General: No swelling.     Cervical back: Normal range of motion and neck supple.     Comments: No specific midline tenderness, some tenderness to the  paraspinal cervical muscles on the right  Skin:    General: Skin is warm and dry.     Capillary Refill: Capillary refill takes less than 2 seconds.  Neurological:     Mental Status: She is alert.     Comments: She has got decree strength in all 4 of her extremities, she got abnormal coordination with heel-to-shin bilaterally she little bit off with finger-to-nose finger in her upper extremities, she states decree sensation in all 4 extremities, normal visual fields normal speech  Psychiatric:        Mood and Affect: Mood normal.     (all labs ordered are listed, but only abnormal results are displayed) Labs Reviewed  CBC WITH DIFFERENTIAL/PLATELET - Abnormal; Notable for the following components:      Result Value   RBC 3.64 (*)    MCV 112.4 (*)    MCH 38.5 (*)    All other components within normal limits  COMPREHENSIVE METABOLIC PANEL WITH GFR - Abnormal; Notable for the following components:   Potassium 3.2 (*)    Chloride 93 (*)    Total Protein 8.2 (*)    AST 43 (*)    Anion gap 16 (*)    All other components within normal limits  VITAMIN B1  VITAMIN B12  FOLATE  VITAMIN B6  PROTEIN ELECTROPHORESIS, SERUM  TSH  METHYLMALONIC ACID, SERUM  HOMOCYSTEINE    EKG: None  Radiology: CT Head Wo Contrast Result Date: 04/12/2024 CLINICAL DATA:  Loss of balance, headache, neck pain, dental abscess EXAM: CT HEAD WITHOUT CONTRAST TECHNIQUE: Contiguous axial images were obtained from the base of the skull through the vertex without intravenous contrast. RADIATION DOSE REDUCTION: This exam was performed according to the departmental dose-optimization program which includes automated exposure control, adjustment of the mA and/or kV according to patient size and/or use of iterative reconstruction technique. COMPARISON:  02/25/2024 FINDINGS: Brain: Streak artifact related to the patient's hair. No acute infarct or hemorrhage. Stable chronic left cerebellar cortical infarct. Lateral  ventricles and midline structures are unremarkable. No acute extra-axial fluid collections. No mass effect. Vascular: No hyperdense vessel or unexpected calcification. Skull: Normal. Negative for fracture or focal lesion. Sinuses/Orbits: Chronic opacification of the right maxillary sinus. Remaining paranasal sinuses are clear. Other: None. IMPRESSION: 1. No acute intracranial process. 2. Stable right maxillary sinus disease. Electronically Signed   By: Ozell Daring M.D.   On: 04/12/2024 17:30   CT Cervical Spine Wo Contrast Result Date: 04/12/2024 CLINICAL DATA:  Polytrauma, blunt Neck and right upper dental abscess. Pain in the extremities  for 3 weeks. EXAM: CT CERVICAL SPINE WITHOUT CONTRAST TECHNIQUE: Multidetector CT imaging of the cervical spine was performed without intravenous contrast. Multiplanar CT image reconstructions were also generated. RADIATION DOSE REDUCTION: This exam was performed according to the departmental dose-optimization program which includes automated exposure control, adjustment of the mA and/or kV according to patient size and/or use of iterative reconstruction technique. COMPARISON:  CT cervical spine 02/25/2024. FINDINGS: Alignment: Straightening of the usual cervical lordosis with a mild cervicothoracic scoliosis. No focal angulation or significant listhesis. Skull base and vertebrae: No evidence of acute cervical spine fracture or traumatic subluxation. Soft tissues and spinal canal: No prevertebral fluid or swelling. No visible canal hematoma. Disc levels: No significant disc space findings identified. The cervical disc heights are maintained. No evidence of large disc herniation, spinal stenosis or significant foraminal narrowing. Upper chest: Clear lung apices. Other: None. IMPRESSION: 1. No evidence of acute cervical spine fracture, traumatic subluxation or static signs of instability. 2. No significant spondylosis or evidence of large disc herniation. Electronically Signed    By: Elsie Perone M.D.   On: 04/12/2024 17:13     Procedures   Medications Ordered in the ED  thiamine  (VITAMIN B1) 500 mg in sodium chloride 0.9 % 50 mL IVPB (500 mg Intravenous New Bag/Given 04/12/24 2201)    Followed by  thiamine  (VITAMIN B1) 250 mg in sodium chloride 0.9 % 50 mL IVPB (has no administration in time range)    Followed by  thiamine  (VITAMIN B1) injection 100 mg (has no administration in time range)  folic acid (FOLVITE) tablet 1 mg (has no administration in time range)  ondansetron  (ZOFRAN ) injection 4 mg (4 mg Intravenous Given 04/12/24 2208)                                    Medical Decision Making Amount and/or Complexity of Data Reviewed Labs: ordered. Radiology: ordered.  Risk Prescription drug management. Decision regarding hospitalization.   Veronica Gaines is here with difficulty with ambulation.  She is also here with some dental pain.  Normal vitals.  No fever.  Her main concern here is with her difficulty with ambulation.  She is here with family.  She is not able to really walk without having to hold onto things.  She started B12 recently and magnesium without much improvement.  She drinks alcohol daily.  She is somewhat globally weak on exam.  Decree sensation in all 4 extremities and symmetric.  She has got really poor coordination with her legs really difficult for her to stand on exam.  Her cranial nerves appear to be intact her speech is intact.  Her vision is intact.  Sounds like she is a pretty heavy drinker/drinker.  Not sure how much of the nutrition she gets family member states she has not really eaten anything.  Overall my main concern is for some sort of vitamin deficiency, thiamine  deficiency B12 deficiency.  I got a head and neck CT which were unremarkable.  I talked with Dr. Cherlyn with neurology who recommends admission for this workup.  Once we have the vitamin levels drawn we will start her on high-dose thiamine .  She is n.p.o. for  now.  Will admit and get MRI with and without contrast of her brain and cervical spine.  I have much lower suspicion for myelopathy or Guillain-Barr as seems clinically less likely.  Ultimately awaiting lab work and then  will admit to hospitalist for further care.  Awaiting to initiate thiamine .  Waiting for lab work to be drawn.  Overall blood work has been drawn.  High-dose thiamine  protocol initiated.  No significant leukocytosis or anemia.  MCV is 112.  Electrolytes otherwise unremarkable.  Will admit to medicine for further care.  Neurology to follow-up.  Will place on CIWA protocol.   This chart was dictated using voice recognition software.  Despite best efforts to proofread,  errors can occur which can change the documentation meaning.      Final diagnoses:  Pain, dental  Neuropathy  Leg weakness, bilateral    ED Discharge Orders     None          Ruthe Cornet, DO 04/12/24 2213

## 2024-04-13 ENCOUNTER — Observation Stay (HOSPITAL_COMMUNITY): Payer: Self-pay

## 2024-04-13 DIAGNOSIS — G8929 Other chronic pain: Secondary | ICD-10-CM | POA: Diagnosis not present

## 2024-04-13 DIAGNOSIS — M542 Cervicalgia: Secondary | ICD-10-CM | POA: Diagnosis not present

## 2024-04-13 DIAGNOSIS — G939 Disorder of brain, unspecified: Secondary | ICD-10-CM | POA: Diagnosis not present

## 2024-04-13 DIAGNOSIS — E876 Hypokalemia: Secondary | ICD-10-CM | POA: Diagnosis not present

## 2024-04-13 DIAGNOSIS — F17210 Nicotine dependence, cigarettes, uncomplicated: Secondary | ICD-10-CM | POA: Diagnosis not present

## 2024-04-13 DIAGNOSIS — G608 Other hereditary and idiopathic neuropathies: Secondary | ICD-10-CM

## 2024-04-13 DIAGNOSIS — G6289 Other specified polyneuropathies: Secondary | ICD-10-CM | POA: Diagnosis not present

## 2024-04-13 DIAGNOSIS — F10939 Alcohol use, unspecified with withdrawal, unspecified: Secondary | ICD-10-CM | POA: Diagnosis present

## 2024-04-13 DIAGNOSIS — K047 Periapical abscess without sinus: Secondary | ICD-10-CM | POA: Diagnosis not present

## 2024-04-13 DIAGNOSIS — R270 Ataxia, unspecified: Secondary | ICD-10-CM | POA: Diagnosis not present

## 2024-04-13 DIAGNOSIS — Z79899 Other long term (current) drug therapy: Secondary | ICD-10-CM | POA: Diagnosis not present

## 2024-04-13 DIAGNOSIS — Z5971 Insufficient health insurance coverage: Secondary | ICD-10-CM | POA: Diagnosis not present

## 2024-04-13 DIAGNOSIS — Z8673 Personal history of transient ischemic attack (TIA), and cerebral infarction without residual deficits: Secondary | ICD-10-CM | POA: Diagnosis not present

## 2024-04-13 DIAGNOSIS — R296 Repeated falls: Secondary | ICD-10-CM | POA: Diagnosis present

## 2024-04-13 DIAGNOSIS — G629 Polyneuropathy, unspecified: Secondary | ICD-10-CM

## 2024-04-13 DIAGNOSIS — E538 Deficiency of other specified B group vitamins: Secondary | ICD-10-CM | POA: Diagnosis not present

## 2024-04-13 DIAGNOSIS — W19XXXA Unspecified fall, initial encounter: Secondary | ICD-10-CM | POA: Diagnosis not present

## 2024-04-13 DIAGNOSIS — F10139 Alcohol abuse with withdrawal, unspecified: Secondary | ICD-10-CM | POA: Diagnosis not present

## 2024-04-13 DIAGNOSIS — R531 Weakness: Secondary | ICD-10-CM | POA: Diagnosis not present

## 2024-04-13 LAB — TSH: TSH: 2.15 u[IU]/mL (ref 0.350–4.500)

## 2024-04-13 LAB — HIV ANTIBODY (ROUTINE TESTING W REFLEX): HIV Screen 4th Generation wRfx: NONREACTIVE

## 2024-04-13 LAB — FOLATE: Folate: <3 ng/mL — ABNORMAL LOW (ref >5.9)

## 2024-04-13 LAB — VITAMIN B12: Vitamin B-12: 171 pg/mL — ABNORMAL LOW (ref 180–914)

## 2024-04-13 LAB — ETHANOL: Alcohol, Ethyl (B): <15 mg/dL (ref <15)

## 2024-04-13 MED ORDER — CYANOCOBALAMIN 1000 MCG/ML IJ SOLN
1000.0000 ug | Freq: Every day | INTRAMUSCULAR | Status: DC
  Administered 2024-04-14 – 2024-04-15 (×2): 1000 ug via SUBCUTANEOUS
  Filled 2024-04-13 (×3): qty 1

## 2024-04-13 MED ORDER — SODIUM CHLORIDE 0.9 % IV SOLN
INTRAVENOUS | Status: AC
Start: 2024-04-13 — End: 2024-04-14

## 2024-04-13 MED ORDER — ONDANSETRON HCL 4 MG/2ML IJ SOLN: 4.0000 mg | Freq: Once | INTRAMUSCULAR | Status: DC

## 2024-04-13 MED ORDER — NICOTINE 14 MG/24HR TD PT24
14.0000 mg | MEDICATED_PATCH | Freq: Every day | TRANSDERMAL | Status: DC
  Filled 2024-04-13 (×2): qty 1

## 2024-04-13 NOTE — ED Notes (Signed)
 [facility] called for transport.

## 2024-04-13 NOTE — Consult Note (Addendum)
 NEUROLOGY CONSULT NOTE   Date of service: April 13, 2024 Patient Name: Veronica Gaines MRN:  [id number] DOB:  10-28-1970 Chief Complaint: Right maxillary dental pain and loss of balance Requesting Provider: Georgina Basket, MD  History of Present Illness  Veronica Gaines is a 53 y.o. female with a hx of anemia, vertigo, alcohol abuse, and polyneuropathy who presented to the ED on 9/28 with CC of dental pain and loss of balance. She reported difficulty walking and presented with neuropathy numbness and tingling in all extremities and weakness. She had decreased strength and abnormal coordination of extremities, but retained normal visual fields and normal speech. ED reports patient drinks alcohol daily. Vitamin levels were drawn and patient was started on high-dose thiamine . Patient admitted for further evaluation.  Today, patient reports feeling weakness in extremities for several weeks that acutely worsened after she began B12, magnesium, and benadryl  on August 12. Patient denies any changes in vision, but endorses headache on the right side of her face related to tooth pain and a dental abscess she has had present for months. She is having radiating pain from her mouth up to her ear and down her neck on the right side only. She has placed cotton in her right ear to dampen the cold air entering her ear and causing pain and sensitivity and states she is having bilateral numbness in her chin area.  She states she grinds her teeth and since having the tooth pain, she has not been able to get insurance to see a dentist. She reports having 4 shot bottles of various flavors of [place] liquor per day and states this helped numb the pain in her top and bottom molars. She states her diet has been poor and while she has tried to eat various foods such as pasta and chicken and stars soup, she feels like she cannot taste anything and she hasn't been able to eat much at all. She states that the abscess she  had in her mouth burst recently and that she swallowed the pus.   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 She denies any burning or tingling in hands or feet.  She reports feeling confused regarding the cause of her current constellation of symptoms and is bothered by her current state of health.  ROS  Comprehensive ROS performed and pertinent positives documented in HPI  Past History   Past Medical History:  Diagnosis Date   Alcohol abuse    Anemia    Polyneuropathy    Vertigo     Past Surgical History:  Procedure Laterality Date   CESAREAN SECTION     CHOLECYSTECTOMY      Family History: History reviewed. No pertinent family history.  Social History  reports that she has been smoking cigarettes. She has never used smokeless tobacco. She reports current alcohol use. She reports that she does not use drugs.  No Known Allergies  Medications   Current Facility-Administered Medications:    LORazepam (ATIVAN) injection 0-4 mg, 0-4 mg, Intravenous, Q6H **OR** LORazepam (ATIVAN) tablet 0-4 mg, 0-4 mg, Oral, Q6H, Curatolo, Adam, DO, 1 mg at 04/12/24 2253   [START ON 04/15/2024] LORazepam (ATIVAN) injection 0-4 mg, 0-4 mg, Intravenous, Q12H **OR** [START ON 04/15/2024] LORazepam (ATIVAN) tablet 0-4 mg, 0-4 mg, Oral, Q12H, Curatolo, Adam, DO   ondansetron  (ZOFRAN ) injection 4 mg,  4 mg, Intravenous, Once, Roselyn Carlin NOVAK, MD   thiamine  (VITAMIN B1) 500 mg in sodium chloride 0.9 % 50 mL IVPB, 500 mg, Intravenous, Q8H, Last Rate: 110 mL/hr at 04/13/24 0703, 500 mg at 04/13/24 0703 **FOLLOWED BY** [START ON 04/15/2024] thiamine  (VITAMIN B1) 250 mg in sodium chloride 0.9 % 50 mL IVPB, 250 mg, Intravenous, Daily **FOLLOWED BY** [START ON 04/21/2024] thiamine  (VITAMIN B1) injection 100 mg, 100 mg, Intravenous, Daily, Curatolo, Adam,  DO  Vitals   Vitals:   04/13/24 0735 04/13/24 0907 04/13/24 0953 04/13/24 1118  BP: 103/82 (!) 117/103  114/80  Pulse: 84 93  86  Resp: 19 18    Temp:  97.9 F (36.6 C)  98 F (36.7 C)  TempSrc:  Oral  Oral  SpO2: 98% 100%  96%  Weight:   102 kg   Height:   5' 7 (1.702 m)     Body mass index is 35.22 kg/m.   Physical Exam   Constitutional: Lying comfortably in bed, in NAD.  Psych: Affect appropriate to situation.  Eyes: No scleral injection.  HENT: No OP obstruction. Dental caries on exam, no obvious facial swelling or drooling noted. Head: Normocephalic. Ears: Cotton ball visibly placed in ear canal by patient due to patient not tolerating cold sensation from air. Cardiovascular: No JVD, no edema noted. Respiratory: Effort normal, non-labored breathing, on room air. GI: Soft.  No distension. There is no tenderness.  Skin: WDI.  Neurologic Examination   Mental Status:  Patient is alert and oriented to person, place, month, and year. No dysarthria or scanning speech noted. Patient answers questions appropriately and can follow commands. Cranial Nerve Exam: II: PERRL, peripheral vision intact III, IV, VI: EOMI, no nystagmus V: Sensory intact bilaterally on upper and middle divisions, loss of sensation to light touch on chin. VII: Facial movements symmetric and intact bilaterally. VIII: Hearing intact bilaterally to voice. IX, X: Symmetrical palate raise, uvula midline XI: Shoulder shrug and chin turn intact bilaterally. XII: Tongue with midline protrusion. Motor Strength/Sensory: -Reduced grip strength, deltoids, elbow flexion and elbow extension bilaterally (4-/5). -Noted drift of bilateral upper extremities against gravity. -Noted drift of bilateral lower extremities back to bed after 5 seconds. -BLE: Hip flexion 3/5, Knee extension and knee flexion 4-/5, Reduced ankle dorsiflexion and plantar flexion strength, 4-/5. Reflexes: 1+ bilateral brachioradialis, biceps  and patellar reflexes.  Cerebellar: Slow, but normal FNF. There is dysdiadochokinesia with RAM bilaterally.   Slowed toe tapping bilaterally with left foot being slower than right; unable to establish a rhythm with dysdiadochokinesia bilaterally.  Patient unable to complete HSK on right side due to weakness. HSK on left leg with reduced strength as patient could not move left leg against gravity, but did slide heel of foot up medial right leg and back down with ataxia noted. Gait: Able to stand with some difficulty, requiring use of her arms to push off from bed from seated to standing. Knees tend to lock due to quadriceps weakness. Severely unsteady gait with small steps, requiring support from examiner and then walker.    Labs/Imaging/Neurodiagnostic studies   CBC:  Recent Labs  Lab 04-12-24 1714 04/12/24 2117  WBC 5.3 4.7  NEUTROABS 3.0 3.3  HGB 13.1 14.0  HCT 38.9 40.9  MCV 114.4* 112.4*  PLT 201 234   Basic Metabolic Panel:  Lab Results  Component Value Date   NA 138 04/12/2024   K 3.2 (L) 04/12/2024   CO2 29 04/12/2024   GLUCOSE 90 04/12/2024  BUN 7 04/12/2024   CREATININE 0.61 04/12/2024   CALCIUM 10.0 04/12/2024   GFRNONAA >60 04/12/2024   GFRAA >60 12/08/2019   Lipid Panel: No results found for: LDLCALC HgbA1c: No results found for: HGBA1C Urine Drug Screen: No results found for: LABOPIA, COCAINSCRNUR, LABBENZ, AMPHETMU, THCU, LABBARB  Alcohol Level     Component Value Date/Time   ETH 13 (H) 12/08/2019 0909   INR  Lab Results  Component Value Date   INR 1.0 12/08/2019   APTT  Lab Results  Component Value Date   APTT 26 12/08/2019   AED levels: No results found for: PHENYTOIN, ZONISAMIDE, LAMOTRIGINE, LEVETIRACETA  CT Head without contrast 8/12 (Personally reviewed): 1. No acute intracranial abnormality. 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 2. Partially visible right [facility] obstructive pattern of  paranasal sinus disease, new since 2021. Chronic left lamina papyracea fracture  CT Cervical Spine wo Contrast 8/12: Negative CT appearance of the Cervical spine. Mild for age cervical spine degeneration.  CT Head without contrast 9/28 (Personally reviewed): 1. No acute intracranial process. 2. Stable right maxillary sinus disease  CT Cervical Spine wo Contrast 9/28: 1. No evidence of acute cervical spine fracture, traumatic subluxation or static signs of instability. 2. No significant spondylosis or evidence of large disc herniation.   MR Cervical Spine w wo Contrast: Ordered  MRI Brain w wo Contrast: Ordered   ASSESSMENT  Veronica Gaines is a 53 y.o. female with a hx of anemia, vertigo, alcohol abuse, and polyneuropathy who presented to the ED on 9/28 with CC of dental pain and loss of balance. She was having difficulty walking and feeling paresthesias in all extremities. She drinks alcohol daily and family member reports she eats very little. Vitamin levels were drawn and patient was admitted for further evaluation. On today's interview, she has a concern over pain in her right upper molars where she states she had a dental abscess that has been present for months and has since burst. She attributes some of her lack of meals from pain with eating. She also endorses sensory numbness in both hands and knees, but denies any burning or tingling.  - Physical examination reveals decreased strength in bilateral upper and lower extremities, worse in BLE, appendicular ataxia in all 4 extremities, and an abnormally weak and unsteady gait requiring assistance. Sensory to light touch is intact bilaterally.  - Exam findings best localize to the bilateral cerebellar hemispheres, although a sensory ataxia in the setting of possible motor and sensory polyneuropathy is also on the DDx.  - Patient is alert and oriented x3, denies any changes in vision, has no nystagmus observed, is able to answer  questions appropriately without confusion, and is receiving vitamin supplementation; suspicion for Wernicke's encephalopathy is low at this time. - Given the presence of reflexes, GBS, including Miller-Fisher variant, is felt to be unlikely. If reflexes become unelicitable, will need further assessment, possibly with LP.  - Pertinent lab results include low folate at <3.0, low B12 171,  MCV 112.4, consistent with macrocytic anemia. Low B12 may the etiology for or contributing, in conjunction with other factors, to the patient's neuropathic symptoms.  - Patient's previous two CTs without contrast and CTs of cervical spine without contrast were negative with the exception of a small chronic appearing left cerebellar infarct that is new since 2021 and right maxillary sinus disease.  - MRI of brain and cervical spine have been ordered to rule out potential lesions leading to  presenting symptoms of weakness and numbness.   RECOMMENDATIONS  - MRI brain and cervical spine. - Continue supplementation of folate and thiamine . - B12 1000 mcg SQ every day (ordered) - CIWA protocol ______________________________________________________________________    Bonney Nidia Bunker, Student-PA [facility]   I have seen and examined the patient. I have formulated the assessment and recommendations. 53 year old female presenting with diffuse weakness, sensory symptoms and ataxia with gait instability resulting in a fall. Exam findings are suggestive of cerebellar involvement versus a sensory and motor polyneuropathy. Recommendations as above.  Electronically signed: Dr. Jimia Gentles

## 2024-04-13 NOTE — Plan of Care (Signed)

## 2024-04-13 NOTE — H&P (Signed)
 History and Physical    Patient: Veronica Gaines FMW:[id number] DOB: 1970-09-28 DOA: 04/12/2024 DOS: the patient was seen and examined on 04/13/2024 PCP: Patient, No Pcp Per  Patient coming from: Home  Chief Complaint:  Chief Complaint  Patient presents with   Dental Pain   loss of balance   HPI: Veronica Gaines is a 53 y.o. 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  The patient presented after a fall that occurred on a recent Saturday morning while opening the back door at home; of note, the patient reported not having consumed alcohol at that time. Per her report, she presented to [facility] ED and was unable to be seen. Later she presented [facility] ED and was evaluated and later recommended for transfer to [facility] for MRI eval. Of note, the pt endorses a history of vertigo, previously treated with Benadryl  for unclear reasons, that she thinks may be contributing to her falls. Of note, pt without obvious vertigo sx at this time.  In the OSH ED, pt AFVSS. Labs notable for K 3.2, folate ,3, and vitamin B12 171 (low),a nd nl TSH. CTH w/ NAICA. CT C-spine w/o active disease. Of note, Cth from 8/12 showed a small chronic appearing left cerebellar infarct is new since 2021. Pt transferred for ongoing evaluation/falls and MRI iso old infarct on CTH from 8/12.   Review of Systems: As mentioned in the history of present illness. All other systems reviewed and are negative. Past Medical History:  Diagnosis Date   Alcohol abuse    Anemia    Polyneuropathy    Vertigo    Past Surgical History:  Procedure Laterality Date   CESAREAN SECTION     CHOLECYSTECTOMY     Social History:  reports that she has been smoking cigarettes. She has never used smokeless tobacco. She reports current alcohol use. She reports that she does not use drugs.  No Known Allergies  History  reviewed. No pertinent family history.  Prior to Admission medications   Medication Sig Start Date End Date Taking? Authorizing Provider  acetaminophen  (TYLENOL ) 500 MG tablet Take 1 tablet (500 mg total) by mouth every 6 (six) hours as needed. 09/20/22   Charlyn Sora, MD  cephALEXin  (KEFLEX ) 500 MG capsule Take 1 capsule (500 mg total) by mouth 4 (four) times daily. 09/20/22   Charlyn Sora, MD  cyclobenzaprine  (FLEXERIL ) 10 MG tablet Take 1 tablet (10 mg total) by mouth 2 (two) times daily as needed for muscle spasms. 12/09/16   Doretha Folks, MD  diclofenac  Sodium (VOLTAREN ) 1 % GEL Apply 2 g topically 4 (four) times daily as needed. 03/16/21   Long, Fonda MATSU, MD  doxycycline  (VIBRAMYCIN ) 100 MG capsule Take 1 capsule (100 mg total) by mouth 2 (two) times daily. 09/20/22   Charlyn Sora, MD  HYDROcodone -acetaminophen  (NORCO/VICODIN) 5-325 MG tablet Take 1 tablet by mouth every 6 (six) hours as needed for moderate pain. 10/23/22   Nivia Colon, PA-C  metroNIDAZOLE  (FLAGYL ) 500 MG tablet Take 1 tablet (500 mg total) by mouth 2 (two) times daily. 12/09/16   Doretha Folks, MD  naproxen  (NAPROSYN ) 500 MG tablet Take 1 tablet (500 mg total) by mouth 2 (two) times daily. 09/20/22   Charlyn Sora, MD  predniSONE  (DELTASONE ) 5 MG tablet Take 6 pills for first day, 5 pills second day, 4 pills third day, 3 pills fourth day, 2  pills the fifth day, and 1 pill sixth day. 03/30/21   Chick Venetia BRAVO, MD    Physical Exam: Vitals:   04/13/24 9264 04/13/24 0907 04/13/24 0953 04/13/24 1118  BP: 103/82 (!) 117/103  114/80  Pulse: 84 93  86  Resp: 19 18    Temp:  97.9 F (36.6 C)  98 F (36.7 C)  TempSrc:  Oral  Oral  SpO2: 98% 100%  96%  Weight:   102 kg   Height:   5' 7 (1.702 m)    General: Alert, oriented x3, resting comfortably in no acute distress HEENT: EOMI, oropharynx clear, moist mucous membranes, hearing intact Neck: Trachea midline and no gross thyromegaly Respiratory: Lungs clear to  auscultation bilaterally with normal respiratory effort; no w/r/r Cardiovascular: Regular rate and rhythm w/o m/r/g Abdomen: Soft, nontender, nondistended. Positive bowel sounds MSK: No obvious joint deformities or swelling Skin: No obvious rashes or lesions Neurologic: Awake, alert, spontaneously moves all extremities, strength intact Psychiatric: Appropriate mood and affect, conversational and cooperative   Data Reviewed:  Lab Results  Component Value Date   WBC 4.7 04/12/2024   HGB 14.0 04/12/2024   HCT 40.9 04/12/2024   MCV 112.4 (H) 04/12/2024   PLT 234 04/12/2024   Lab Results  Component Value Date   GLUCOSE 90 04/12/2024   CALCIUM 10.0 04/12/2024   NA 138 04/12/2024   K 3.2 (L) 04/12/2024   CO2 29 04/12/2024   CL 93 (L) 04/12/2024   BUN 7 04/12/2024   CREATININE 0.61 04/12/2024   Lab Results  Component Value Date   ALT 26 04/12/2024   AST 43 (H) 04/12/2024   ALKPHOS 86 04/12/2024   BILITOT 0.6 04/12/2024   Lab Results  Component Value Date   INR 1.0 12/08/2019   Radiology: CT Head Wo Contrast Result Date: 04/12/2024 CLINICAL DATA:  Loss of balance, headache, neck pain, dental abscess EXAM: CT HEAD WITHOUT CONTRAST TECHNIQUE: Contiguous axial images were obtained from the base of the skull through the vertex without intravenous contrast. RADIATION DOSE REDUCTION: This exam was performed according to the departmental dose-optimization program which includes automated exposure control, adjustment of the mA and/or kV according to patient size and/or use of iterative reconstruction technique. COMPARISON:  02/25/2024 FINDINGS: Brain: Streak artifact related to the patient's hair. No acute infarct or hemorrhage. Stable chronic left cerebellar cortical infarct. Lateral ventricles and midline structures are unremarkable. No acute extra-axial fluid collections. No mass effect. Vascular: No hyperdense vessel or unexpected calcification. Skull: Normal. Negative for fracture or  focal lesion. Sinuses/Orbits: Chronic opacification of the right maxillary sinus. Remaining paranasal sinuses are clear. Other: None. IMPRESSION: 1. No acute intracranial process. 2. Stable right maxillary sinus disease. Electronically Signed   By: Ozell Daring M.D.   On: 04/12/2024 17:30   CT Cervical Spine Wo Contrast Result Date: 04/12/2024 CLINICAL DATA:  Polytrauma, blunt Neck and right upper dental abscess. Pain in the extremities for 3 weeks. EXAM: CT CERVICAL SPINE WITHOUT CONTRAST TECHNIQUE: Multidetector CT imaging of the cervical spine was performed without intravenous contrast. Multiplanar CT image reconstructions were also generated. RADIATION DOSE REDUCTION: This exam was performed according to the departmental dose-optimization program which includes automated exposure control, adjustment of the mA and/or kV according to patient size and/or use of iterative reconstruction technique. COMPARISON:  CT cervical spine 02/25/2024. FINDINGS: Alignment: Straightening of the usual cervical lordosis with a mild cervicothoracic scoliosis. No focal angulation or significant listhesis. Skull base and vertebrae: No evidence of acute cervical  spine fracture or traumatic subluxation. Soft tissues and spinal canal: No prevertebral fluid or swelling. No visible canal hematoma. Disc levels: No significant disc space findings identified. The cervical disc heights are maintained. No evidence of large disc herniation, spinal stenosis or significant foraminal narrowing. Upper chest: Clear lung apices. Other: None. IMPRESSION: 1. No evidence of acute cervical spine fracture, traumatic subluxation or static signs of instability. 2. No significant spondylosis or evidence of large disc herniation. Electronically Signed   By: Elsie Perone M.D.   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  GLF Macrocytic anemia Low B12 and folate; on oral repletion -MIVF: NS at 100cc/h for now -F/u PT/OT eval; apprec eval/recs -F/u B12 and thiamine  levels and replete prn as this is the most likely etiology of pt recurrent falls  Chronic L cerebellar infarct -F/u MRI brain; if positive, consult Neurology; otherwise, OP Neurology evaluation  Neck pain -F/u MRI C-spine;  if positive, consult Neurology; otherwise, OP Neurology evaluation  Alcohol abuse -CIWA per protocol   Advance Care Planning:   Code Status: Full Code   Consults: N/A  Family Communication: Son  Severity of Illness: The appropriate patient status for this patient is INPATIENT. Inpatient status is judged to be reasonable and necessary in order to provide the required intensity of service to ensure the patient's safety. The patient's presenting symptoms, physical exam findings, and initial radiographic and laboratory data in the context of their chronic comorbidities is felt to place them at high risk for further clinical deterioration. Furthermore, it is not anticipated that the patient will be medically stable for discharge from the hospital within 2 midnights of admission.   * I certify that at the point of admission it is my clinical judgment that the patient will require inpatient hospital care spanning beyond 2 midnights from the point of admission due to high intensity of service, high risk for further deterioration and high frequency of surveillance required.*   ------- I spent 61 minutes reviewing previous notes, at the bedside counseling/discussing the treatment plan, and performing clinical documentation.  Author: Marsha Ada, MD 04/13/2024 2:47 PM  For on call review [email or web address].

## 2024-04-14 ENCOUNTER — Observation Stay (HOSPITAL_COMMUNITY): Payer: Self-pay

## 2024-04-14 DIAGNOSIS — Z5971 Insufficient health insurance coverage: Secondary | ICD-10-CM | POA: Diagnosis not present

## 2024-04-14 DIAGNOSIS — R270 Ataxia, unspecified: Secondary | ICD-10-CM | POA: Diagnosis present

## 2024-04-14 DIAGNOSIS — G629 Polyneuropathy, unspecified: Secondary | ICD-10-CM

## 2024-04-14 DIAGNOSIS — G6289 Other specified polyneuropathies: Secondary | ICD-10-CM | POA: Diagnosis not present

## 2024-04-14 DIAGNOSIS — K047 Periapical abscess without sinus: Secondary | ICD-10-CM | POA: Diagnosis present

## 2024-04-14 DIAGNOSIS — M542 Cervicalgia: Secondary | ICD-10-CM | POA: Diagnosis present

## 2024-04-14 DIAGNOSIS — F10139 Alcohol abuse with withdrawal, unspecified: Secondary | ICD-10-CM | POA: Diagnosis not present

## 2024-04-14 DIAGNOSIS — F17210 Nicotine dependence, cigarettes, uncomplicated: Secondary | ICD-10-CM | POA: Diagnosis present

## 2024-04-14 DIAGNOSIS — Z79899 Other long term (current) drug therapy: Secondary | ICD-10-CM | POA: Diagnosis not present

## 2024-04-14 DIAGNOSIS — E876 Hypokalemia: Secondary | ICD-10-CM | POA: Diagnosis present

## 2024-04-14 DIAGNOSIS — G8929 Other chronic pain: Secondary | ICD-10-CM | POA: Diagnosis present

## 2024-04-14 DIAGNOSIS — R296 Repeated falls: Secondary | ICD-10-CM | POA: Diagnosis present

## 2024-04-14 DIAGNOSIS — R531 Weakness: Secondary | ICD-10-CM | POA: Diagnosis present

## 2024-04-14 DIAGNOSIS — W19XXXA Unspecified fall, initial encounter: Secondary | ICD-10-CM | POA: Diagnosis present

## 2024-04-14 DIAGNOSIS — Z8673 Personal history of transient ischemic attack (TIA), and cerebral infarction without residual deficits: Secondary | ICD-10-CM | POA: Diagnosis not present

## 2024-04-14 DIAGNOSIS — G9589 Other specified diseases of spinal cord: Secondary | ICD-10-CM | POA: Diagnosis not present

## 2024-04-14 DIAGNOSIS — G548 Other nerve root and plexus disorders: Secondary | ICD-10-CM | POA: Diagnosis not present

## 2024-04-14 DIAGNOSIS — E538 Deficiency of other specified B group vitamins: Secondary | ICD-10-CM | POA: Diagnosis present

## 2024-04-14 LAB — HOMOCYSTEINE: Homocysteine: 141 umol/L — ABNORMAL HIGH (ref 0.0–14.5)

## 2024-04-14 LAB — CBC WITH DIFFERENTIAL/PLATELET
Abs Immature Granulocytes: 0.01 K/uL (ref 0.00–0.07)
Basophils Absolute: 0 K/uL (ref 0.0–0.1)
Basophils Relative: 0 %
Eosinophils Absolute: 0 K/uL (ref 0.0–0.5)
Eosinophils Relative: 1 %
HCT: 36.8 % (ref 36.0–46.0)
Hemoglobin: 12.4 g/dL (ref 12.0–15.0)
Immature Granulocytes: 0 %
Lymphocytes Relative: 27 %
Lymphs Abs: 1.3 K/uL (ref 0.7–4.0)
MCH: 38 pg — ABNORMAL HIGH (ref 26.0–34.0)
MCHC: 33.7 g/dL (ref 30.0–36.0)
MCV: 112.9 fL — ABNORMAL HIGH (ref 80.0–100.0)
Monocytes Absolute: 0.4 K/uL (ref 0.1–1.0)
Monocytes Relative: 9 %
Neutro Abs: 3 K/uL (ref 1.7–7.7)
Neutrophils Relative %: 63 %
Platelets: 230 K/uL (ref 150–400)
RBC: 3.26 MIL/uL — ABNORMAL LOW (ref 3.87–5.11)
RDW: 12.9 % (ref 11.5–15.5)
Smear Review: NORMAL
WBC: 4.7 K/uL (ref 4.0–10.5)
nRBC: 0.6 % — ABNORMAL HIGH (ref 0.0–0.2)

## 2024-04-14 LAB — RAPID URINE DRUG SCREEN, HOSP PERFORMED
Amphetamines: NOT DETECTED
Barbiturates: NOT DETECTED
Benzodiazepines: POSITIVE — AB
Cocaine: NOT DETECTED
Opiates: NOT DETECTED
Tetrahydrocannabinol: POSITIVE — AB

## 2024-04-14 LAB — COMPREHENSIVE METABOLIC PANEL WITH GFR
ALT: 24 U/L (ref 0–44)
AST: 37 U/L (ref 15–41)
Albumin: 3 g/dL — ABNORMAL LOW (ref 3.5–5.0)
Alkaline Phosphatase: 56 U/L (ref 38–126)
Anion gap: 14 (ref 5–15)
BUN: 6 mg/dL (ref 6–20)
CO2: 27 mmol/L (ref 22–32)
Calcium: 8.9 mg/dL (ref 8.9–10.3)
Chloride: 96 mmol/L — ABNORMAL LOW (ref 98–111)
Creatinine, Ser: 0.76 mg/dL (ref 0.44–1.00)
GFR, Estimated: >60 mL/min (ref >60)
Glucose, Bld: 86 mg/dL (ref 70–99)
Potassium: 3.2 mmol/L — ABNORMAL LOW (ref 3.5–5.1)
Sodium: 137 mmol/L (ref 135–145)
Total Bilirubin: 1 mg/dL (ref 0.0–1.2)
Total Protein: 6.8 g/dL (ref 6.5–8.1)

## 2024-04-14 LAB — MAGNESIUM: Magnesium: 1.8 mg/dL (ref 1.7–2.4)

## 2024-04-14 MED ORDER — FOLIC ACID 1 MG PO TABS
1.0000 mg | ORAL_TABLET | Freq: Every day | ORAL | Status: DC
  Administered 2024-04-14 – 2024-04-15 (×2): 1 mg via ORAL
  Filled 2024-04-14 (×2): qty 1

## 2024-04-14 MED ORDER — ACETAMINOPHEN 325 MG PO TABS: 650.0000 mg | ORAL_TABLET | Freq: Four times a day (QID) | ORAL | Status: DC | PRN

## 2024-04-14 MED ORDER — GADOBUTROL 1 MMOL/ML IV SOLN
10.0000 mL | Freq: Once | INTRAVENOUS | Status: AC | PRN
Start: 2024-04-14 — End: 2024-04-14
  Administered 2024-04-14: 10 mL via INTRAVENOUS

## 2024-04-14 MED ORDER — POTASSIUM CHLORIDE CRYS ER 20 MEQ PO TBCR
40.0000 meq | EXTENDED_RELEASE_TABLET | ORAL | Status: AC
  Administered 2024-04-14 (×2): 40 meq via ORAL
  Filled 2024-04-14 (×2): qty 2

## 2024-04-14 NOTE — Plan of Care (Addendum)
@   05:30am: Patient has declined vitals at this time. Asked two times and patient stated, I haven't slept since yesterday, I am tired. Do it later. Educated the importance of vitals.    Problem: Clinical Measurements: Goal: Respiratory complications will improve Outcome: Progressing Goal: Cardiovascular complication will be avoided Outcome: Progressing   Problem: Nutrition: Goal: Adequate nutrition will be maintained Outcome: Progressing   Problem: Education: Goal: Knowledge of General Education information will improve Description: Including pain rating scale, medication(s)/side effects and non-pharmacologic comfort measures Outcome: Not Progressing   Problem: Health Behavior/Discharge Planning: Goal: Ability to manage health-related needs will improve Outcome: Not Progressing   Problem: Clinical Measurements: Goal: Will remain free from infection Outcome: Not Progressing   Problem: Activity: Goal: Risk for activity intolerance will decrease Outcome: Not Progressing

## 2024-04-14 NOTE — Progress Notes (Addendum)
 PROGRESS NOTE    Veronica Gaines  FMW:[id number] DOB: 1970-10-21 DOA: 04/12/2024 PCP: Patient, No Pcp Per   Brief Narrative:  Veronica Gaines is a 53 y.o. 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   The patient presented after a fall that occurred on a recent Saturday morning while opening the back door at home; of note, the patient reported not having consumed alcohol at that time. Per her report, she presented to [facility] ED and was unable to be seen. Later she presented [facility] ED and was evaluated and later recommended for transfer to [facility] for MRI eval. Of note, the pt endorses a history of vertigo, previously treated with Benadryl  for unclear reasons, that she thinks may be contributing to her falls.   Upon arrival to ED, patient hemodynamically stable. Labs notable for K 3.2, folate ,3, and vitamin B12 171 (low), normal TSH. CTH unremarkable. CT C-spine w/o active disease. Of note, CT head from 8/12 showed a small chronic appearing left cerebellar infarct is new since 2021. Pt transferred to [facility]: For ongoing evaluation/falls and MRI.  Assessment & Plan:   Principal Problem:   Neuropathy Active Problems:   Alcohol withdrawal (HCC)   Recurrent falls  Neck pain/recurrent falls/B12 deficiency: Her recurrent falls are presumed to be due to sensory deficit due to vitamin deficiency/B12.  Continue supplement via IM injections daily.  PT OT to see.  CT head shows chronic left cerebellar infarct.  MRI brain and cervical spine pending.  Neurology following.  Folate deficiency: No replacement ordered, I will order replacement.  Hypokalemia: BMP shows hypokalemia on 04/12/2024.  No labs done at the time of admission.  Repeating labs today along with magnesium.  Addendum: Potassium low, will replenish.  Magnesium normal.  Alcohol abuse: Alcohol  level normal at the time of admission.  Patient on CIWA protocol.  CIWA under 5.  DVT prophylaxis: SCDs Start: 04/13/24 9078   Code Status: Full Code  Family Communication: Son was present during the first time I went but he was not present the second time. Plan of care discussed with patient in length and he/she verbalized understanding and agreed with it.  Status is: Observation The patient will require care spanning > 2 midnights and should be moved to inpatient because: Still symptomatic with lower extremity weakness, numbness and tingling.   Estimated body mass index is 35.22 kg/m as calculated from the following:   Height as of this encounter: 5' 7 (1.702 m).   Weight as of this encounter: 102 kg.    Nutritional Assessment: Body mass index is 35.22 kg/m.SABRA Seen by dietician.  I agree with the assessment and plan as outlined below: Nutrition Status:        . Skin Assessment: I have examined the patient's skin and I agree with the wound assessment as performed by the wound care RN as outlined below:    Consultants:  Neurology  Procedures:  None  Antimicrobials:  Anti-infectives (From admission, onward)    None         Subjective: Patient seen and examined.  When I first went to her room, the curtain in the room was pulled, her son and she were on the other side.  She did not let me come to the room.  She said she was brushing her teeth and asked me to come back.  I waited outside her room for 10 minutes.  Went back.  I asked permission to come in and she did not allow me to come in.  I told her that I am the physician to see her here the second time.  She has been to wait another 5 minutes.  When I spoke to her, she was extremely rude.  When I asked her how she was feeling and if she has any complaints.  Her answer was  many but who cares.  After that, when I started talking to her more, she opened up and said that she is still having weakness bilateral lower  extremities as well as numbness and tingling and she has been having that for few months.  She also endorsed that she has been drinking heavily almost on daily basis 4-5 drinks since the age of 53 and she lost her job about 6 months ago and picked up more since then.  She was able to speak in full sentences.  She did have some swelling at the mandibular area bilaterally.  She had no dental complaints today but she understands that she has infection for which she needs to see a dentist.  I had a lengthy discussion about her about B12 deficiency and folate deficiency as a source of her symptoms.  Objective: Vitals:   04/13/24 1118 04/13/24 1724 04/13/24 2105 04/14/24 0731  BP: 114/80 107/76 (!) 118/90 107/84  Pulse: 86 99 (!) 103 85  Resp:  19 18 16   Temp: 98 F (36.7 C) 99 F (37.2 C) 98.9 F (37.2 C) (P) 98.1 F (36.7 C)  TempSrc: Oral Oral Oral (P) Oral  SpO2: 96% 99% 99% 97%  Weight:      Height:        Intake/Output Summary (Last 24 hours) at 04/14/2024 0823 Last data filed at 04/14/2024 0329 Gross per 24 hour  Intake 180.94 ml  Output --  Net 180.94 ml   Filed Weights   04/12/24 1636 04/13/24 0953  Weight: 102.1 kg 102 kg    Examination:  General exam: Appears calm and comfortable  Respiratory system: Clear to auscultation. Respiratory effort normal. Cardiovascular system: S1 & S2 heard, RRR. No JVD, murmurs, rubs, gallops or clicks. No pedal edema. Gastrointestinal system: Abdomen is nondistended, soft and nontender. No organomegaly or masses felt. Normal bowel sounds heard. Central nervous system: Alert and oriented.  Decree sensation in bilateral lower extremities.  Good strength in bilateral lower extremities although only slightly weak. Extremities: Symmetric 5 x 5 power. Skin: No rashes, lesions or ulcers   Data Reviewed: I have personally reviewed following labs and imaging studies  CBC: Recent Labs  Lab 04/10/24 1714 04/12/24 2117  WBC 5.3 4.7  NEUTROABS  3.0 3.3  HGB 13.1 14.0  HCT 38.9 40.9  MCV 114.4* 112.4*  PLT 201 234   Basic Metabolic Panel: Recent Labs  Lab 04/10/24 1714 04/12/24 2117  NA 137 138  K 3.0* 3.2*  CL 96* 93*  CO2 26 29  GLUCOSE 78 90  BUN 7 7  CREATININE 0.71 0.61  CALCIUM 9.1 10.0   GFR: Estimated Creatinine Clearance: 98.7 mL/min (by C-G formula based on SCr of 0.61 mg/dL). Liver Function Tests: Recent Labs  Lab 04/10/24 1714 04/12/24 2117  AST 33 43*  ALT 21 26  ALKPHOS 61 86  BILITOT 0.7 0.6  PROT 7.4 8.2*  ALBUMIN 3.3* 4.4   No results for input(s): LIPASE, AMYLASE in the last 168 hours. No results for  input(s): AMMONIA in the last 168 hours. Coagulation Profile: No results for input(s): INR, PROTIME in the last 168 hours. Cardiac Enzymes: No results for input(s): CKTOTAL, CKMB, CKMBINDEX, TROPONINI in the last 168 hours. BNP (last 3 results) No results for input(s): PROBNP in the last 8760 hours. HbA1C: No results for input(s): HGBA1C in the last 72 hours. CBG: No results for input(s): GLUCAP in the last 168 hours. Lipid Profile: No results for input(s): CHOL, HDL, LDLCALC, TRIG, CHOLHDL, LDLDIRECT in the last 72 hours. Thyroid Function Tests: Recent Labs    04/12/24 2117  TSH 2.150   Anemia Panel: Recent Labs    04/12/24 2117  VITAMINB12 171*  FOLATE <3.0*   Sepsis Labs: No results for input(s): PROCALCITON, LATICACIDVEN in the last 168 hours.  No results found for this or any previous visit (from the past 240 hours).   Radiology Studies: CT Head Wo Contrast Result Date: 04/12/2024 CLINICAL DATA:  Loss of balance, headache, neck pain, dental abscess EXAM: CT HEAD WITHOUT CONTRAST TECHNIQUE: Contiguous axial images were obtained from the base of the skull through the vertex without intravenous contrast. RADIATION DOSE REDUCTION: This exam was performed according to the departmental dose-optimization program which includes automated  exposure control, adjustment of the mA and/or kV according to patient size and/or use of iterative reconstruction technique. COMPARISON:  02/25/2024 FINDINGS: Brain: Streak artifact related to the patient's hair. No acute infarct or hemorrhage. Stable chronic left cerebellar cortical infarct. Lateral ventricles and midline structures are unremarkable. No acute extra-axial fluid collections. No mass effect. Vascular: No hyperdense vessel or unexpected calcification. Skull: Normal. Negative for fracture or focal lesion. Sinuses/Orbits: Chronic opacification of the right maxillary sinus. Remaining paranasal sinuses are clear. Other: None. IMPRESSION: 1. No acute intracranial process. 2. Stable right maxillary sinus disease. Electronically Signed   By: Ozell Daring M.D.   On: 04/12/2024 17:30   CT Cervical Spine Wo Contrast Result Date: 04/12/2024 CLINICAL DATA:  Polytrauma, blunt Neck and right upper dental abscess. Pain in the extremities for 3 weeks. EXAM: CT CERVICAL SPINE WITHOUT CONTRAST TECHNIQUE: Multidetector CT imaging of the cervical spine was performed without intravenous contrast. Multiplanar CT image reconstructions were also generated. RADIATION DOSE REDUCTION: This exam was performed according to the departmental dose-optimization program which includes automated exposure control, adjustment of the mA and/or kV according to patient size and/or use of iterative reconstruction technique. COMPARISON:  CT cervical spine 02/25/2024. FINDINGS: Alignment: Straightening of the usual cervical lordosis with a mild cervicothoracic scoliosis. No focal angulation or significant listhesis. Skull base and vertebrae: No evidence of acute cervical spine fracture or traumatic subluxation. Soft tissues and spinal canal: No prevertebral fluid or swelling. No visible canal hematoma. Disc levels: No significant disc space findings identified. The cervical disc heights are maintained. No evidence of large disc herniation,  spinal stenosis or significant foraminal narrowing. Upper chest: Clear lung apices. Other: None. IMPRESSION: 1. No evidence of acute cervical spine fracture, traumatic subluxation or static signs of instability. 2. No significant spondylosis or evidence of large disc herniation. Electronically Signed   By: Elsie Perone M.D.   On: 04/12/2024 17:13    Scheduled Meds:  cyanocobalamin  1,000 mcg Subcutaneous Daily   folic acid  1 mg Oral Daily   LORazepam  0-4 mg Intravenous Q6H   Or   LORazepam  0-4 mg Oral Q6H   [START ON 04/15/2024] LORazepam  0-4 mg Intravenous Q12H   Or   [START ON 04/15/2024] LORazepam  0-4 mg  Oral Q12H   nicotine  14 mg Transdermal Daily   ondansetron  (ZOFRAN ) IV  4 mg Intravenous Once   [START ON 04/21/2024] thiamine  (VITAMIN B1) injection  100 mg Intravenous Daily   Continuous Infusions:  sodium chloride Stopped (04/13/24 1601)   thiamine  (VITAMIN B1) injection 500 mg (04/14/24 0533)   Followed by   NOREEN ON 04/15/2024] thiamine  (VITAMIN B1) injection       LOS: 0 days   Fredia Skeeter, MD [facility]  04/14/2024, 8:23 AM   *Please note that this is a verbal dictation therefore any spelling or grammatical errors are due to the Dragon Medical One system interpretation.  Please page via Amion and do not message via secure chat for urgent patient care matters. Secure chat can be used for non urgent patient care matters.  How to contact the [facility] Attending or Consulting provider 7A - 7P or covering provider during after hours 7P -7A, for this patient?  Check the care team in [facility] and look for a) attending/consulting [facility] provider listed and b) the [facility] team listed. Page or secure chat 7A-7P. Log into [email or web address] and use [place]'s universal password to access. If you do not have the password, please contact the hospital operator. Locate the [facility] provider you are looking for under [facility] and page to a number that you can be directly reached. If  you still have difficulty reaching the provider, please page the [facility] (Director on Call) for the Hospitalists listed on amion for assistance.

## 2024-04-14 NOTE — Evaluation (Signed)
 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 Pt reports increasing numbness and a fall in the last 2-3 weeks and increasing difficulty with mobilization. Pt is limited in safe mobility by decreased sensation, proprioception and strength especially in bilateral knees R>L. Pt is contact guard for transfers and min -modA for ambulation with RW. Pt becomes unsteady because she uses knee hyperextension for stability and when muscles tire she is unable to attain knee lock out for stability. Pt hopeful b12 shot will help in returning sensation and proprioception. PT recommending HHPT at discharge and will refer to Mobility Specialist. PT will continue to follow acutely.        If plan is discharge home, recommend the following: A little help with walking and/or transfers;A little help with bathing/dressing/bathroom;Assistance with cooking/housework;Assist for transportation;Help with stairs or ramp for entrance   Can travel by private vehicle    Yes    Equipment Recommendations Rolling walker (2 wheels);Other (comment) (tub bench)     Functional Status Assessment Patient has had a recent decline in their functional status and demonstrates the ability to make significant improvements in function in a reasonable and predictable amount of time.     Precautions / Restrictions Precautions Precautions: Fall Precaution/Restrictions Comments: fell a few days ago Restrictions Weight Bearing Restrictions Per Provider Order: No      Mobility  Bed Mobility               General bed mobility comments: sitting on EoB on entry    Transfers Overall transfer level: Needs  assistance Equipment used: Rolling walker (2 wheels) Transfers: Sit to/from Stand Sit to Stand: Contact guard assist           General transfer comment: contact guard for safety, cues for hand placement for power up to standing. utilizes locking knees for achieving balance.    Ambulation/Gait Ambulation/Gait assistance: Min assist, Mod assist Gait Distance (Feet): 20 Feet Assistive device: Rolling walker (2 wheels) Gait Pattern/deviations: Step-through pattern, Decreased step length - right, Decreased step length - left, Knee hyperextension - right, Knee hyperextension - left, Narrow base of support, Shuffle Gait velocity: slowed Gait velocity interpretation: <1.31 ft/sec, indicative of household ambulator   General Gait Details: minA progressing to modA for steadying due to increasing weakness, utilizes knee hyperextension for stabilization and towards the end of ambulation needing modA for decreased ability to maintain balance with unlocking knee to advance LE, bilaterally R>L      Balance Overall balance assessment: Needs assistance Sitting-balance support: Feet supported, No upper extremity supported Sitting balance-Leahy Scale: Fair     Standing balance support: During functional activity, Reliant on assistive device for balance, Bilateral upper extremity supported Standing balance-Leahy Scale: Poor Standing balance comment: requrires UE support for dynamic balance                             Pertinent Vitals/Pain Pain Assessment Pain Assessment: No/denies pain    Home Living Family/patient expects to be discharged to:: Private residence Living Arrangements: Parent;Other relatives (sister) Available Help at Discharge: Family;Available PRN/intermittently Type of Home: House Home Access: Level entry       Home  Layout: Multi-level;Able to live on main level with bedroom/bathroom (sister lives in basement) Home Equipment: Hand held shower head       Prior Function Prior Level of Function : Independent/Modified Independent             Mobility Comments: very active, works with EVS at [employer] ADLs Comments: independent with ADLs, and iADLS     Extremity/Trunk Assessment   Upper Extremity Assessment Upper Extremity Assessment: Defer to OT evaluation    Lower Extremity Assessment Lower Extremity Assessment: RLE deficits/detail;LLE deficits/detail RLE Deficits / Details: hip flex 3/5, knee ext 4-/5, knee flex 3/5, ankle dorsiflexion 2/5, ankle plantar flex 3+/5 RLE Sensation: decreased light touch;decreased proprioception (especially around knees) RLE Coordination: decreased fine motor LLE Deficits / Details: hip flex 3/5, knee ext 4-/5, knee flex 3/5, ankle dorsiflexion 2/5, ankle plantar flex 3+/5 LLE Sensation: decreased light touch;decreased proprioception (especially around knees) LLE Coordination: decreased fine motor    Cervical / Trunk Assessment Cervical / Trunk Assessment: Normal  Communication   Communication Communication: Impaired Factors Affecting Communication: Difficulty expressing self (talks in circles)    Cognition Arousal: Alert Behavior During Therapy: Restless   PT - Cognitive impairments: Problem solving                       PT - Cognition Comments: pt requires increased time and verbal assist for problem solving Following commands: Impaired Following commands impaired: Follows one step commands with increased time, Follows multi-step commands with increased time     Cueing Cueing Techniques: Verbal cues, Gestural cues, Tactile cues, Visual cues     General Comments General comments (skin integrity, edema, etc.): VSS on RA        Assessment/Plan    PT Assessment Patient needs continued PT services  PT Problem List Decreased strength;Decreased activity tolerance;Decreased balance;Decreased mobility;Decreased coordination;Decreased safety awareness;Impaired  sensation;Pain       PT Treatment Interventions DME instruction;Gait training;Functional mobility training;Therapeutic activities;Therapeutic exercise;Balance training;Neuromuscular re-education;Cognitive remediation;Patient/family education    PT Goals (Current goals can be found in the Care Plan section)  Acute Rehab PT Goals PT Goal Formulation: With patient Time For Goal Achievement: 04/28/24 Potential to Achieve Goals: Fair    Frequency Min 3X/week        AM-PAC PT 6 Clicks Mobility  Outcome Measure Help needed turning from your back to your side while in a flat bed without using bedrails?: None Help needed moving from lying on your back to sitting on the side of a flat bed without using bedrails?: None Help needed moving to and from a bed to a chair (including a wheelchair)?: A Little Help needed standing up from a chair using your arms (e.g., wheelchair or bedside chair)?: A Little Help needed to walk in hospital room?: A Little Help needed climbing 3-5 steps with a railing? : A Lot 6 Click Score: 19    End of Session Equipment Utilized During Treatment: Gait belt Activity Tolerance: Patient tolerated treatment well Patient left: in bed;Other (comment) (transport present to take her to MRI) Nurse Communication: Mobility status PT Visit Diagnosis: Muscle weakness (generalized) (M62.81);Unsteadiness on feet (R26.81);Other abnormalities of gait and mobility (R26.89);Difficulty in walking, not elsewhere classified (R26.2);Other symptoms and signs involving the nervous system (R29.898)    Time: 8961-8897 PT Time Calculation (min) (ACUTE ONLY): 24 min   Charges:   PT Evaluation $PT Eval Moderate Complexity: 1 Mod PT Treatments $Gait Training: 8-22 mins PT General Charges $$ ACUTE PT  VISIT: 1 Visit         Lyllian Gause B. Fleeta Lapidus PT, DPT Acute Rehabilitation Services Please use secure chat or  Call Office [phone]   Almarie KATHEE Fleeta Fleet 04/14/2024,  11:45 AM

## 2024-04-14 NOTE — Progress Notes (Signed)
 OT Cancellation Note  Patient Details Name: Veronica Gaines MRN: [id number] DOB: 1971-04-11   Cancelled Treatment:    Reason Eval/Treat Not Completed: Patient at procedure or test/ unavailable (MRI will check back as appropriate)  Ely Molt 04/14/2024, 1:16 PM

## 2024-04-14 NOTE — Progress Notes (Signed)
 Transition of Care [facility]) - Inpatient Brief Assessment   Patient Details  Name: MARYCLAIRE STOECKER MRN: [id number] Date of Birth: 03/28/1971  Transition of Care [facility]) CM/SW Contact:    Rosaline JONELLE Joe, RN Phone Number: 04/14/2024, 3:14 PM   Clinical Narrative: CM met with the patient at the bedside to discuss IP Care needs.  The patient lives with her family at the home and plans to return when medically stable.  Patient states that she recently lost her job at [employer].  Patient drinks ETOH frequently and plans on quitting.  Patient also smokes cigarettes but does not plan to stop at this time.  Resources for substance abuse counseling placed in the AVS.  Smoking cessation also included in the AVS.  PCP follow up placed in the AVS for patient to call and schedule an appointment.  Patient has no insurance coverage.  I sent secure email to financial counselor to assist with medicaid application.  Patient needs RW to home.  I called [employer] and requested delivery of RW to the bedside.  Patient plans to order tub bench through [employer].  LOG contract to be obtained and sent to [employer] to cover cost of RW.  Patient normally drives and is not homebound to qualify for home health services.  Patient requests OP referral for OP therapy instead of home health.  Referral placed for OP therapy to be co-signed by MD.  No other IP Care management needs at this time.  No other IP Care management needs.   Transition of Care Asessment: Insurance and Status: (P) Selfpay Patient has primary care physician: (P) No Home environment has been reviewed: (P) from home with family Prior level of function:: (P) self Prior/Current Home Services: (P) No current home services Social Drivers of Health Review: (P) SDOH reviewed interventions complete Readmission risk has been reviewed: (P) Yes Transition of care needs: (P) transition of care needs identified, TOC will continue to follow

## 2024-04-15 ENCOUNTER — Other Ambulatory Visit (HOSPITAL_COMMUNITY): Payer: Self-pay

## 2024-04-15 DIAGNOSIS — E538 Deficiency of other specified B group vitamins: Secondary | ICD-10-CM | POA: Insufficient documentation

## 2024-04-15 DIAGNOSIS — G9589 Other specified diseases of spinal cord: Secondary | ICD-10-CM

## 2024-04-15 DIAGNOSIS — G6289 Other specified polyneuropathies: Secondary | ICD-10-CM

## 2024-04-15 DIAGNOSIS — G548 Other nerve root and plexus disorders: Secondary | ICD-10-CM

## 2024-04-15 LAB — BASIC METABOLIC PANEL WITH GFR
Anion gap: 11 (ref 5–15)
BUN: 6 mg/dL (ref 6–20)
CO2: 26 mmol/L (ref 22–32)
Calcium: 8.8 mg/dL — ABNORMAL LOW (ref 8.9–10.3)
Chloride: 98 mmol/L (ref 98–111)
Creatinine, Ser: 0.8 mg/dL (ref 0.44–1.00)
GFR, Estimated: >60 mL/min (ref >60)
Glucose, Bld: 77 mg/dL (ref 70–99)
Potassium: 3.5 mmol/L (ref 3.5–5.1)
Sodium: 135 mmol/L (ref 135–145)

## 2024-04-15 LAB — PROTEIN ELECTROPHORESIS, SERUM
A/G Ratio: 0.8 (ref 0.7–1.7)
Albumin ELP: 3.5 g/dL (ref 2.9–4.4)
Alpha-1-Globulin: 0.3 g/dL (ref 0.0–0.4)
Alpha-2-Globulin: 0.8 g/dL (ref 0.4–1.0)
Beta Globulin: 1.4 g/dL — ABNORMAL HIGH (ref 0.7–1.3)
Gamma Globulin: 2.2 g/dL — ABNORMAL HIGH (ref 0.4–1.8)
Globulin, Total: 4.6 g/dL — ABNORMAL HIGH (ref 2.2–3.9)
Total Protein ELP: 8.1 g/dL (ref 6.0–8.5)

## 2024-04-15 MED ORDER — VITAMIN B-12 1000 MCG PO TABS
1000.0000 ug | ORAL_TABLET | Freq: Every day | ORAL | 0 refills | Status: DC
  Filled 2024-04-15: qty 90, 90d supply, fill #0

## 2024-04-15 MED ORDER — FOLIC ACID 1 MG PO TABS
1.0000 mg | ORAL_TABLET | Freq: Every day | ORAL | 0 refills | Status: DC
  Filled 2024-04-15: qty 90, 90d supply, fill #0

## 2024-04-15 MED ORDER — THIAMINE HCL 100 MG PO TABS
100.0000 mg | ORAL_TABLET | Freq: Every day | ORAL | 0 refills | Status: DC
  Filled 2024-04-15: qty 90, 90d supply, fill #0

## 2024-04-15 NOTE — Progress Notes (Signed)
 OT evaluation  PT admitted with dental pain and LOB . Pt currently with functional limitiations due to the deficits listed below (see OT problem list). 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 Pt at this time demonstrate balance deficit that requires RW for safety during transfers. Pt expressed anxiety and concerns that others will need to assist her and wanting to be fully indep. Pt becoming more upset when OT educated that session was complete and ending. Pt reports not wanting anyone to come to her house for therapy and wanting outpatient. Pt verbalized that mother or daughter will drive her to any appointments. Pt educated on option of home services, community services or outpatient with pt deciding appointment.  Pt will benefit from skilled OT to increase their independence and safety with adls and balance to allow discharge outpatient..   04/15/24 1100  OT Visit Information  Last OT Received On 04/15/24  History of Present Illness 53 yo female dental pain and LOB  Ct negative MRI (negative)  PMH Chronic L cerebellar infarct 8/12 alcohol abuse, recurrent falls  Precautions  Precautions Fall  Precaution/Restrictions Comments fell a few days ago  Restrictions  Weight Bearing Restrictions Per Provider Order No  Home Living  Family/patient expects to be discharged to: Private residence  Living Arrangements Parent;Other relatives  Available Help at Discharge Family;Available PRN/intermittently  Type of Home House  Home Access Level entry  Home Layout Multi-level  Bathroom Shower/Tub Tub/shower unit  Horticulturist, commercial Yes  Home Equipment Hand held shower head  Additional Comments mother still works, 3 dogs in the house - ususally walks the dogs but can let them out into the yard. laundry and sister room is in the basement level and 12-13 steps to get upstairs. pt asked questions about home and pt states google it. the picture will do you better than me.  just google it pt declined to further define the entrance that pt describes now as not level entry  Prior Function  Prior Level of Function  Independent/Modified Independent  Mobility Comments very active, works with EVS at [employer]  ADLs Comments independent with ADLs, and iADLS  Cognition  Arousal Alert  Behavior During Therapy Anxious;Impulsive;Restless  Following Commands  Following commands Impaired  Following commands impaired Follows one step commands inconsistently  Cueing  Cueing Techniques Verbal cues;Tactile cues  Communication  Communication Impaired  Factors Affecting Communication Difficulty expressing self  Upper Extremity Assessment  Upper Extremity Assessment Generalized weakness;Right hand dominant;RUE deficits/detail;LUE deficits/detail  RUE Deficits / Details tremor noted with functional movement. pt able to demonstrate grasp for opening containers and sustaining grasp on objects such as cups, tooth paste. pt pulling off tag from clothing that OT offered to cut with bil hands  RUE Coordination decreased gross motor  LUE Deficits / Details tremor noted with functional movement. pt able to demonstrate grasp for opening containers and sustaining grasp on objects such as cups, tooth paste. pt pulling off tag from clothing that OT offered to cut with bil hands  LUE Coordination decreased gross motor  Lower Extremity Assessment  Lower Extremity Assessment Defer to PT evaluation  Cervical / Trunk Assessment  Cervical / Trunk Assessment Normal  Vision- History  Patient Visual Report No change from baseline  Vision- Assessment  Additional Comments reports not vision changes. demosntrates visual scanning accurately during session  ADL  Overall ADL's  Needs assistance/impaired  Eating/Feeding Modified independent;Sitting  Grooming Wash/dry hands;Wash/dry face;Oral care;Applying deodorant;Modified independent;Sitting  Grooming  Details (indicate cue type and  reason) sitting at sink level on BSC  Upper Body Bathing Modified independent;Sitting  Lower Body Bathing Contact guard assist;Sit to/from stand  Lower Body Bathing Details (indicate cue type and reason) CGA with standing as pt with coordination deficits noted but able to complete without physical (A) .  Upper Body Dressing  Modified independent  Lower Body Dressing Supervision/safety;Sit to/from stand  Lower Body Dressing Details (indicate cue type and reason) don pants  Toilet Transfer Contact guard assist;Rolling walker (2 wheels);BSC/3in1  Functional mobility during ADLs Contact guard assist;Rolling walker (2 wheels)  Bed Mobility  General bed mobility comments sitting eob on arrival  Transfers  Overall transfer level Needs assistance  Equipment used Rolling walker (2 wheels)  Transfers Sit to/from Stand  Sit to Stand Contact guard assist  General transfer comment cues for hand placement for safety. pt insisting on 2 wheel rolling walker with brakes at the base to lock. pt educated that those are not an option and we can prevent falls with proper hand placement. pt advised not to pull on RW but rather push from bed surface.  Balance  Overall balance assessment Needs assistance  Sitting-balance support Bilateral upper extremity supported;Feet supported  Sitting balance-Leahy Scale Fair  Standing balance support Single extremity supported;During functional activity;Reliant on assistive device for balance  Standing balance-Leahy Scale Poor  General Comments  General comments (skin integrity, edema, etc.) VSS on RA  OT - End of Session  Equipment Utilized During Treatment Rolling walker (2 wheels)  Activity Tolerance Patient tolerated treatment well  Patient left in bed;with call bell/phone within reach;Other (comment) (CNA in room for vitals)  Nurse Communication Mobility status;Precautions  OT Assessment  OT Recommendation/Assessment Patient needs continued OT Services  OT Visit  Diagnosis Unsteadiness on feet (R26.81);Muscle weakness (generalized) (M62.81)  OT Problem List Decreased strength;Decreased activity tolerance;Impaired balance (sitting and/or standing);Decreased cognition;Decreased safety awareness;Decreased knowledge of use of DME or AE;Decreased knowledge of precautions;Obesity  OT Plan  OT Frequency (ACUTE ONLY) Min 2X/week  OT Treatment/Interventions (ACUTE ONLY) Self-care/ADL training;Energy conservation;DME and/or AE instruction;Manual therapy;Therapeutic activities;Patient/family education;Balance training  AM-PAC OT 6 Clicks Daily Activity Outcome Measure (Version 2)  Help from another person eating meals? 4  Help from another person taking care of personal grooming? 4  Help from another person toileting, which includes using toliet, bedpan, or urinal? 3  Help from another person bathing (including washing, rinsing, drying)? 3  Help from another person to put on and taking off regular upper body clothing? 4  Help from another person to put on and taking off regular lower body clothing? 3  6 Click Score 21  Progressive Mobility  What is the highest level of mobility based on the mobility assessment? Level 4 (Ambulates with assistance) - Balance while stepping forward/back - Complete  Mobility Referral Yes  Activity Ambulated with assistance  OT Recommendation  Recommendations for Other Services PT consult  Follow Up Recommendations Outpatient OT  Patient can return home with the following A little help with walking and/or transfers;A little help with bathing/dressing/bathroom  Functional Status Assessent Patient has had a recent decline in their functional status and demonstrates the ability to make significant improvements in function in a reasonable and predictable amount of time.  OT Equipment Other (comment);BSC/3in1 (RW)  Individuals Consulted  Consulted and Agree with Results and Recommendations Patient  Acute Rehab OT Goals  Patient  Stated Goal i want phsycial therapy to walk the hall  OT Goal Formulation With patient  Time For Goal Achievement 04/29/24  Potential to Achieve Goals Good  OT Time Calculation  OT Start Time (ACUTE ONLY) 1100  OT Stop Time (ACUTE ONLY) 1146  OT Time Calculation (min) 46 min  OT General Charges  $OT Visit 1 Visit  OT Evaluation  $OT Eval Moderate Complexity 1 Mod  OT Treatments  $Self Care/Home Management  8-22 mins   Brynn, OTR/L  Acute Rehabilitation Services Office: [phone] .

## 2024-04-15 NOTE — Discharge Summary (Signed)
 Physician Discharge Summary  Veronica Gaines FMW:[id number] DOB: 1971-04-03 DOA: 04/12/2024  PCP: Patient, No Pcp Per  Admit date: 04/12/2024 Discharge date: 04/15/2024 30 Day Unplanned Readmission Risk Score    Flowsheet Row ED to Hosp-Admission (Current) from 04/12/2024 in [place] [facility]  30 Day Unplanned Readmission Risk Score (%) 8.7 Filed at 04/15/2024 0801    This score is the patient's risk of an unplanned readmission within 30 days of being discharged (0 -100%). The score is based on dignosis, age, lab data, medications, orders, and past utilization.   Low:  0-14.9   Medium: 15-21.9   High: 22-29.9   Extreme: 30 and above          Admitted From: Home Disposition: Home  Recommendations for Outpatient Follow-up:  Follow up with PCP in 1-2 weeks Please obtain BMP/CBC in one week Follow-up with neurology in 4 weeks if symptoms do not improve. Please follow up with your PCP on the following pending results: Unresulted Labs (From admission, onward)     Start     Ordered   04/12/24 2051  Methylmalonic acid, serum  Once,   URGENT        04/12/24 2050   04/12/24 2049  Vitamin B6  Once,   URGENT        04/12/24 2048   04/12/24 2011  Vitamin B1  Once,   URGENT        04/12/24 2010              Home Health: Yes Equipment/Devices: Walker  Discharge Condition: Stable CODE STATUS: Full code Diet recommendation:  Diet Order             Diet regular Room service appropriate? Yes; Fluid consistency: Thin  Diet effective now                   Subjective: Seen and examined earlier with the nurse present with me.  Patient's son was at the bedside.  Nurse had already informed me that patient and her son had been very disrespectful and rude to the staff.  When I entered the room, I started talking to the patient and asking her about how she was feeling, her son who was at the bedside continued to interrupt her when she started talking.  She was annoyed with  that and she eventually asked him to stop talking and she wanted to talk for herself.  Despite of this the son continued to talk in between our conversation which made it very hard for me to hear patient well.  Patient and I both had asked the son to let her talk and then once she is done, he will have the opportunity to talk as well.  Nothing worked.  At some point in time, when the nurse Harlene was trying to reiterate and explained the same thing to the son that I did to the the patient just 2 minutes ago, the son was extremely disrespectful and rude to her and said  I am not talking to you, I am talking to the physician.  This Going on, at that point in time, we both had to walk out of the room and called security.  Security then removed the son out of the room.  I then went back and talked to the patient.  She said that her symptoms were improving but she was worried about being able to handle herself at home since she lives alone with couple of pets.  She had requested reassessment by PT OT.  During the second time, charge nurse was also with me.  I explained to the patient that her symptoms are likely secondary to severe deficiency, B12, folate and possibly thiamine , the result of which was still pending.  I addressed all questions of the patient.  Patient was informed that PT OT had cleared her for discharge with recommendations for home health.  Patient said she does not want people to come to her home due to the pets and she would rather go outpatient PT, I told her that is her choice.  Regardless, reportedly, OT saw her after I left and work with her for about an hour.  PT was on the way to see her however patient decided to leave before she was reassessed by PT.  Brief/Interim Summary: Veronica Gaines is a 53 y.o. 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   The patient presented after a fall that occurred on a recent Saturday morning while opening the back door at home; of note, the patient reported not having consumed alcohol at that time. Per her report, she presented to [facility] ED and was unable to be seen. Later she presented [facility] ED and was evaluated and later recommended for transfer to [facility] for MRI eval. Of note, the pt endorses a history of vertigo, previously treated with Benadryl  for unclear reasons, that she thinks may be contributing to her falls.    Upon arrival to ED, patient hemodynamically stable. Labs notable for K 3.2, folate ,3, and vitamin B12 171 (low), normal TSH. CTH unremarkable. CT C-spine w/o active disease. Of note, CT head from 8/12 showed a small chronic appearing left cerebellar infarct is new since 2021. Pt transferred to [facility]: For ongoing evaluation/falls and MRI.  Neck pain/recurrent falls/B12 deficiency: Her recurrent falls are presumed to be due to sensory deficit due to vitamin deficiency/B12, folate and possibly B1.  Patient was started on daily high-dose thiamine  and B12 supplement as well as folate supplement. CT head shows chronic left cerebellar infarct.  MRI brain and cervical spine negative for any pathology.  Patient was seen by neurology on the day of admission on 04/13/2024, their recommendations were to obtain MRI brain and cervical spine.  These were completed on 04/14/2024.  Patient was not seen by neurology again after initial consultation, which led me to think that there are no recommendations. On the day of discharge on 04/15/2024, I sent a message to Dr. Merrianne asking if patient is cleared.  I did not receive any response back.  Patient had left the hospital around noon time.  I then received a message from Dr. Lindzen about his plans of ordering MRI of the thoracic and lumbar spine and by that time, patient had already left.  This plan was not communicated to me prior to this at all and neurology note  was not completed either.  He recommended calling the patient and asking to come back to the ED to complete the workup with MRI thoracic and lumbar spine.  I tried calling patient's phone number available in the chart [phone] and had to leave a voicemail for her.  I also tried calling patient's emergency contact/her mother at [phone] but this phone number appeared to be not working.  I then called Dr. Lindzen as well who told me that he also  tried calling without any response.  On the morning of 04/16/2024, I tried calling both numbers again and once again, I left a voicemail on patient's phone number and mother's phone number was still not working.  At this point in time, I await patient's return phone call, if no return call, I plan to call in the afternoon again. Of note, I prescribed BUN, B12 and folate supplement to the patient.  I am unsure if patient had picked up those medications.   Folate deficiency: Replacement ordered, prescribed.   Hypokalemia: Replenished  Dental infection: Patient was able to speak in full sentences and tolerate regular diet.  Patient never complained of any pain.  She did appear to have mild swelling at the mandibular area.  Per her, she thinks that her abscess burst a few days ago when she swallowed the pus.  Patient had no fever or leukocytosis.  On examination, she did have decayed and infected teeth but I do not think she had any active abscess.  Unfortunately, Veronica Gaines did not have any dental coverage during the time she was here.  She was strongly advised to see a dentist as soon as possible outpatient.   Alcohol abuse: Alcohol level normal at the time of admission.  Patient on CIWA protocol.  CIWA under 5.  Discharge plan was discussed with patient and/or family member and they verbalized understanding and agreed with it.  Discharge Diagnoses:  Principal Problem:   Neuropathy Active Problems:   Alcohol withdrawal (HCC)   Recurrent falls   Folate  deficiency    Discharge Instructions  Discharge Instructions     Ambulatory referral to Physical Therapy   Complete by: As directed    Iontophoresis - 4 mg/ml of dexamethasone: No   T.E.N.S. Unit Evaluation and Dispense as Indicated: No      Allergies as of 04/15/2024   No Known Allergies      Medication List     TAKE these medications    Acetaminophen  Extra Strength 500 MG Tabs Commonly known as: TYLENOL  Take 1 tablet (500 mg total) by mouth every 6 (six) hours as needed.   aspirin EC 81 MG tablet Take 81 mg by mouth daily. Swallow whole.   diphenhydrAMINE  25 mg capsule Commonly known as: BENADRYL  Take 25 mg by mouth at bedtime as needed for sleep.   folic acid 1 MG tablet Commonly known as: FOLVITE Take 1 tablet (1 mg total) by mouth daily. Start taking on: April 16, 2024   magnesium oxide 400 (240 Mg) MG tablet Commonly known as: MAG-OX Take 400 mg by mouth daily.   thiamine  100 MG tablet Commonly known as: VITAMIN B1 Take 1 tablet (100 mg total) by mouth daily.   vitamin B-12 100 MCG tablet Commonly known as: CYANOCOBALAMIN Take 100 mcg by mouth daily. What changed: Another medication with the same name was added. Make sure you understand how and when to take each.   cyanocobalamin 1000 MCG tablet Commonly known as: VITAMIN B12 Take 1 tablet (1,000 mcg total) by mouth daily. What changed: You were already taking a medication with the same name, and this prescription was added. Make sure you understand how and when to take each.               Durable Medical Equipment  (From admission, onward)           Start     Ordered   04/14/24 1506  For home use only DME Walker rolling  Once       Question Answer Comment  Walker: With 5 Inch Wheels   Patient needs a walker to treat with the following condition Neuropathy      04/14/24 1506            Follow-up Information     [facility] Follow up.   Why: [facility] will  provide you with a RW before you are discharged home. Contact information: [address] [place] [address] [phone]         [facility] Follow up.   Specialty: Rehabilitation Why: Please call the Outpatient therapy center and follow up for needed Outpatient Physical therapy. Contact information: [address]  [address] [place]  [phone]        [facility] Follow up.   Specialty: Internal Medicine Why: Please call the office and schedule a hospital follow up in the next 7-10 days. Contact information: [address] [place]  [address] [phone]        PCP Follow up in 1 week(s).                 No Known Allergies  Consultations: Neurology   Procedures/Studies: MR Cervical Spine W and Wo Contrast Result Date: 04/14/2024 CLINICAL DATA:  53 year old female with recent loss of balance, pain. Difficulty walking. EXAM: MRI CERVICAL SPINE WITHOUT AND WITH CONTRAST TECHNIQUE: Multiplanar and multiecho pulse sequences of the cervical spine, to include the craniocervical junction and cervicothoracic junction, were obtained without and with intravenous contrast. CONTRAST:  10mL GADAVIST GADOBUTROL 1 MMOL/ML IV SOLN COMPARISON:  Cervical spine CT last month. Brain MRI today reported separately. FINDINGS: Alignment: Stable straightening, minor reversal of normal cervical lordosis. Vertebrae: Background bone marrow signal is within normal limits. Maintained vertebral height. No marrow edema or evidence of acute osseous abnormality. No abnormal enhancement identified. Cord: Normal. Capacious spinal canal. No abnormal intradural enhancement or dural thickening identified. Posterior Fossa, vertebral arteries, paraspinal tissues: Cervicomedullary junction is within normal limits. Brain is reported separately today. Maintained major vascular flow voids in  the bilateral neck. Negative visible neck soft tissues. Disc levels: Capacious spinal canal and mild for age cervical spine degeneration. There is evidence of a small broad-based central disc protrusion at C3-C4 (series 14, image 9). But no spinal stenosis. No neural foraminal foraminal stenosis. Grossly negative visible upper thoracic spine. IMPRESSION: Normal for age MRI appearance of the Cervical Spine. Electronically Signed   By: VEAR Hurst M.D.   On: 04/14/2024 12:55   MR Brain W and Wo Contrast Result Date: 04/14/2024 CLINICAL DATA:  53 year old female with recent loss of balance, pain. Difficulty walking. EXAM: MRI HEAD WITHOUT AND WITH CONTRAST TECHNIQUE: Multiplanar, multiecho pulse sequences of the brain and surrounding structures were obtained without and with intravenous contrast. CONTRAST:  10mL GADAVIST GADOBUTROL 1 MMOL/ML IV SOLN COMPARISON:  Head CT 04/12/2024 and earlier. FINDINGS: Brain: No restricted diffusion to suggest acute infarction. No midline shift, mass effect, evidence of mass lesion, ventriculomegaly, extra-axial collection or acute intracranial hemorrhage. Cervicomedullary junction and pituitary are within normal limits. Cerebral volume may be at the lower limits of normal for age. No disproportionate regional brain atrophy is identified. Largely normal for age gray and white matter signal throughout the brain. Chronic left cerebellar infarct series 5, image 7, unchanged from CT last month. No cerebral cortical encephalomalacia. No chronic cerebral blood products identified. No abnormal enhancement  identified. No dural thickening. Vascular: Major intracranial vascular flow voids are preserved. Following contrast major dural venous sinuses are enhancing and appear to be patent. Skull and upper cervical spine: Cervical spine MRI reported separately today. Visualized bone marrow signal is within normal limits. Sinuses/Orbits: Orbits appear negative. Right OMC obstructive pattern of  paranasal sinus disease stable from last month. Other paranasal sinuses well aerated. Other: Trace mastoid fluid. Negative visible nasopharynx. Visible internal auditory structures are grossly normal. Negative visible scalp and face. IMPRESSION: 1. No acute intracranial abnormality. Small chronic left cerebellar infarct. Otherwise negative for age MRI appearance of the brain. 2. Right OMC obstructive pattern of paranasal sinus disease stable from CT last month. 3. Cervical spine MRI reported separately. Electronically Signed   By: VEAR Hurst M.D.   On: 04/14/2024 12:50   CT Head Wo Contrast Result Date: 04/12/2024 CLINICAL DATA:  Loss of balance, headache, neck pain, dental abscess EXAM: CT HEAD WITHOUT CONTRAST TECHNIQUE: Contiguous axial images were obtained from the base of the skull through the vertex without intravenous contrast. RADIATION DOSE REDUCTION: This exam was performed according to the departmental dose-optimization program which includes automated exposure control, adjustment of the mA and/or kV according to patient size and/or use of iterative reconstruction technique. COMPARISON:  02/25/2024 FINDINGS: Brain: Streak artifact related to the patient's hair. No acute infarct or hemorrhage. Stable chronic left cerebellar cortical infarct. Lateral ventricles and midline structures are unremarkable. No acute extra-axial fluid collections. No mass effect. Vascular: No hyperdense vessel or unexpected calcification. Skull: Normal. Negative for fracture or focal lesion. Sinuses/Orbits: Chronic opacification of the right maxillary sinus. Remaining paranasal sinuses are clear. Other: None. IMPRESSION: 1. No acute intracranial process. 2. Stable right maxillary sinus disease. Electronically Signed   By: Ozell Daring M.D.   On: 04/12/2024 17:30   CT Cervical Spine Wo Contrast Result Date: 04/12/2024 CLINICAL DATA:  Polytrauma, blunt Neck and right upper dental abscess. Pain in the extremities for 3 weeks.  EXAM: CT CERVICAL SPINE WITHOUT CONTRAST TECHNIQUE: Multidetector CT imaging of the cervical spine was performed without intravenous contrast. Multiplanar CT image reconstructions were also generated. RADIATION DOSE REDUCTION: This exam was performed according to the departmental dose-optimization program which includes automated exposure control, adjustment of the mA and/or kV according to patient size and/or use of iterative reconstruction technique. COMPARISON:  CT cervical spine 02/25/2024. FINDINGS: Alignment: Straightening of the usual cervical lordosis with a mild cervicothoracic scoliosis. No focal angulation or significant listhesis. Skull base and vertebrae: No evidence of acute cervical spine fracture or traumatic subluxation. Soft tissues and spinal canal: No prevertebral fluid or swelling. No visible canal hematoma. Disc levels: No significant disc space findings identified. The cervical disc heights are maintained. No evidence of large disc herniation, spinal stenosis or significant foraminal narrowing. Upper chest: Clear lung apices. Other: None. IMPRESSION: 1. No evidence of acute cervical spine fracture, traumatic subluxation or static signs of instability. 2. No significant spondylosis or evidence of large disc herniation. Electronically Signed   By: Elsie Perone M.D.   On: 04/12/2024 17:13     Discharge Exam: Vitals:   04/15/24 0431 04/15/24 0833  BP: 124/87 (!) 121/96  Pulse: 92 97  Resp: 18 18  Temp: 98.7 F (37.1 C) 98.5 F (36.9 C)  SpO2: 97% 99%   Vitals:   04/14/24 1940 04/15/24 0014 04/15/24 0431 04/15/24 0833  BP: 129/86 124/80 124/87 (!) 121/96  Pulse: 88 92 92 97  Resp:   18 18  Temp:  98.5 F (36.9 C) 98.7 F (37.1 C) 98.5 F (36.9 C)  TempSrc:  Oral Oral Oral  SpO2: 100% 100% 97% 99%  Weight:      Height:        General: Pt is alert, awake, not in acute distress Cardiovascular: RRR, S1/S2 +, no rubs, no gallops Respiratory: CTA bilaterally, no  wheezing, no rhonchi Abdominal: Soft, NT, ND, bowel sounds + Extremities: no edema, no cyanosis    The results of significant diagnostics from this hospitalization (including imaging, microbiology, ancillary and laboratory) are listed below for reference.     Microbiology: No results found for this or any previous visit (from the past 240 hours).   Labs: BNP (last 3 results) No results for input(s): BNP in the last 8760 hours. Basic Metabolic Panel: Recent Labs  Lab 04/10/24 1714 04/12/24 2117 04/14/24 0900 04/15/24 0813  NA 137 138 137 135  K 3.0* 3.2* 3.2* 3.5  CL 96* 93* 96* 98  CO2 26 29 27 26   GLUCOSE 78 90 86 77  BUN 7 7 6 6   CREATININE 0.71 0.61 0.76 0.80  CALCIUM 9.1 10.0 8.9 8.8*  MG  --   --  1.8  --    Liver Function Tests: Recent Labs  Lab 04/10/24 1714 04/12/24 2117 04/14/24 0900  AST 33 43* 37  ALT 21 26 24   ALKPHOS 61 86 56  BILITOT 0.7 0.6 1.0  PROT 7.4 8.2* 6.8  ALBUMIN 3.3* 4.4 3.0*   No results for input(s): LIPASE, AMYLASE in the last 168 hours. No results for input(s): AMMONIA in the last 168 hours. CBC: Recent Labs  Lab 04/10/24 1714 04/12/24 2117 04/14/24 0900  WBC 5.3 4.7 4.7  NEUTROABS 3.0 3.3 3.0  HGB 13.1 14.0 12.4  HCT 38.9 40.9 36.8  MCV 114.4* 112.4* 112.9*  PLT 201 234 230   Cardiac Enzymes: No results for input(s): CKTOTAL, CKMB, CKMBINDEX, TROPONINI in the last 168 hours. BNP: Invalid input(s): POCBNP CBG: No results for input(s): GLUCAP in the last 168 hours. D-Dimer No results for input(s): DDIMER in the last 72 hours. Hgb A1c No results for input(s): HGBA1C in the last 72 hours. Lipid Profile No results for input(s): CHOL, HDL, LDLCALC, TRIG, CHOLHDL, LDLDIRECT in the last 72 hours. Thyroid function studies Recent Labs    04/12/24 2117  TSH 2.150   Anemia work up Recent Labs    04/12/24 2117  VITAMINB12 171*  FOLATE <3.0*   Urinalysis    Component Value  Date/Time   COLORURINE AMBER (A) 12/09/2016 1428   APPEARANCEUR CLOUDY (A) 12/09/2016 1428   LABSPEC 1.023 12/09/2016 1428   PHURINE 5.5 12/09/2016 1428   GLUCOSEU NEGATIVE 12/09/2016 1428   HGBUR TRACE (A) 12/09/2016 1428   BILIRUBINUR NEGATIVE 12/09/2016 1428   KETONESUR 15 (A) 12/09/2016 1428   PROTEINUR NEGATIVE 12/09/2016 1428   NITRITE POSITIVE (A) 12/09/2016 1428   LEUKOCYTESUR MODERATE (A) 12/09/2016 1428   Sepsis Labs Recent Labs  Lab 04/10/24 1714 04/12/24 2117 04/14/24 0900  WBC 5.3 4.7 4.7   Microbiology No results found for this or any previous visit (from the past 240 hours).  FURTHER DISCHARGE INSTRUCTIONS:   Get Medicines reviewed and adjusted: Please take all your medications with you for your next visit with your Primary MD   Laboratory/radiological data: Please request your Primary MD to go over all hospital tests and procedure/radiological results at the follow up, please ask your Primary MD to get all Hospital records sent to his/her  office.   In some cases, they will be blood work, cultures and biopsy results pending at the time of your discharge. Please request that your primary care M.D. goes through all the records of your hospital data and follows up on these results.   Also Note the following: If you experience worsening of your admission symptoms, develop shortness of breath, life threatening emergency, suicidal or homicidal thoughts you must seek medical attention immediately by calling 911 or calling your MD immediately  if symptoms less severe.   You must read complete instructions/literature along with all the possible adverse reactions/side effects for all the Medicines you take and that have been prescribed to you. Take any new Medicines after you have completely understood and accpet all the possible adverse reactions/side effects.    patient was instructed, not to drive, operate heavy machinery, perform activities at heights, swimming or  participation in water activities or provide baby-sitting services while on Pain, Sleep and Anxiety Medications; until their outpatient Physician has advised to do so again. Also recommended to not to take more than prescribed Pain, Sleep and Anxiety Medications.  It is not advisable to combine anxiety, sleep and pain medications without talking with your primary care provider.     Wear Seat belts while driving.   Please note: You were cared for by a hospitalist during your hospital stay. Once you are discharged, your primary care physician will handle any further medical issues. Please note that NO REFILLS for any discharge medications will be authorized once you are discharged, as it is imperative that you return to your primary care physician (or establish a relationship with a primary care physician if you do not have one) for your post hospital discharge needs so that they can reassess your need for medications and monitor your lab values  Time coordinating discharge: Over 30 minutes  SIGNED:   Fredia Skeeter, MD  [facility] 04/15/2024, 10:52 AM *Please note that this is a verbal dictation therefore any spelling or grammatical errors are due to the Dragon Medical One system interpretation. If 7PM-7AM, please contact night-coverage [email or web address]

## 2024-04-15 NOTE — Progress Notes (Signed)
 Went in room with provider to speak with the patient about discharge. Patient and her son both present at the time. As the provider was trying to explain about the discharge both the patient and her son kept interrupting him, cussing back and forth at each other and refusing to leave. Son demanded that his mom stay in the hospital. He became very irate and rude.

## 2024-04-15 NOTE — Progress Notes (Signed)
 Patient was very upset about being discharged. She made a phone call from her room and was yelling so loud that everyone on the hall could hear her conversation. Whomever she was on the phone with, she told them to tell her son to change hgis clothes and come back up here on the floor. Security has already walked the patients son out of the hospital and asked him not to return. So the patient thought that if she told him to go change clothes and come back that staff wouldn't recognize him. Security was made aware of this as well as staff on the unit.

## 2024-04-15 NOTE — Progress Notes (Signed)
 NEUROLOGY CONSULT FOLLOW UP NOTE   Date of service: April 15, 2024 Patient Name: Veronica Gaines MRN:  [id number] DOB:  1970-07-22  Interval Hx/subjective  Pt is sitting upright this morning and states she has been able to walk with a walker to the bathroom. She states that right her eye is dry and bothering her where she has had her contact in for days. She is still having numbness in her knees and hands as well as tingling in hands and legs. She says her underarms feel heavy. She is still having dental pain and neck pain. Patient states she wants more information regarding a dentist for her tooth pain. Her son is present with her at this time.  Vitals   Vitals:   04/14/24 1800 04/14/24 1940 04/15/24 0014 04/15/24 0431  BP: 116/76 129/86 124/80 124/87  Pulse: 84 88 92 92  Resp:    18  Temp:   98.5 F (36.9 C) 98.7 F (37.1 C)  TempSrc:   Oral Oral  SpO2:  100% 100% 97%  Weight:      Height:         Body mass index is 35.22 kg/m.  Physical Exam   Constitutional: Sitting in bed, in no acute distress. Psych: Affect appropriate to situation.  Eyes: No scleral injection.  HENT: No OP obstrucion.  Head: Normocephalic.  Cardiovascular: No JVD, no edema Respiratory: Effort normal, non-labored breathing.  GI: Soft.  No distension. Skin: WDI.   Neurologic Examination   Mental Status: Patient is alert, oriented with ability to answer questions, give history, and follow commands. No dysarthria, scanning speech, or aphasia present. Repetition and naming objects intact. Cranial Nerve Exam: II: PERRL, peripheral vision intact III, IV, VI: EOMI, no nystagmus V: Sensory intact bilaterally on upper and lower face, rough sensation to light touch on right cheek. VII: Facial movements symmetric and intact bilaterally. VIII: Hearing intact to voice. IX, X: Symmetrical palate raise, uvula midline XI: Shoulder shrug and chin turn intact bilaterally. XII: Tongue with midline  protrusion. Motor: Upper extremity strength 5/5 with the exception of reduced grip strength bilaterally.  Noted drift of left upper extremity against gravity. Lower extremity strength decreased and unchanged from last exam Sensory: Sensory decreased in left arm and left leg compared to right arm and right leg. Reflexes:  1+ bilateral brachioradialis and patellar reflexes. Cerebellar:  FNF slow with slight tremor Rapid alternating movements delayed (finger tapping and toe tapping); unable to establish a rhythm with toe tapping Gait: Patient able to ambulate with assistance from a walker, small steps, locks knees, unsteady. No overt ataxia noted.   Medications  Current Facility-Administered Medications:    acetaminophen  (TYLENOL ) tablet 650 mg, 650 mg, Oral, Q6H PRN, Pahwani, Ravi, MD   cyanocobalamin (VITAMIN B12) injection 1,000 mcg, 1,000 mcg, Subcutaneous, Daily, Ah Bott, MD, 1,000 mcg at 04/14/24 1422   folic acid (FOLVITE) tablet 1 mg, 1 mg, Oral, Daily, Pahwani, Ravi, MD, 1 mg at 04/14/24 0935   LORazepam (ATIVAN) injection 0-4 mg, 0-4 mg, Intravenous, Q12H **OR** LORazepam (ATIVAN) tablet 0-4 mg, 0-4 mg, Oral, Q12H, Curatolo, Adam, DO   nicotine (NICODERM CQ - dosed in mg/24 hours) patch 14 mg, 14 mg, Transdermal, Daily, Georgina Basket, MD   ondansetron  (ZOFRAN ) injection 4 mg, 4 mg, Intravenous, Once, Roselyn Carlin NOVAK, MD   [COMPLETED] thiamine  (VITAMIN B1) 500 mg in sodium chloride 0.9 % 50 mL IVPB, 500 mg, Intravenous, Q8H, Last Rate: 110 mL/hr at 04/14/24 1430, 500 mg at  04/14/24 1430 **FOLLOWED BY** thiamine  (VITAMIN B1) 250 mg in sodium chloride 0.9 % 50 mL IVPB, 250 mg, Intravenous, Daily **FOLLOWED BY** [START ON 04/21/2024] thiamine  (VITAMIN B1) injection 100 mg, 100 mg, Intravenous, Daily, Ruthe Cornet, DO  Labs and Diagnostic Imaging   CBC:  Recent Labs  Lab 04/12/24 2117 04/14/24 0900  WBC 4.7 4.7  NEUTROABS 3.3 3.0  HGB 14.0 12.4  HCT 40.9 36.8  MCV  112.4* 112.9*  PLT 234 230    Basic Metabolic Panel:  Lab Results  Component Value Date   NA 137 04/14/2024   K 3.2 (L) 04/14/2024   CO2 27 04/14/2024   GLUCOSE 86 04/14/2024   BUN 6 04/14/2024   CREATININE 0.76 04/14/2024   CALCIUM 8.9 04/14/2024   GFRNONAA >60 04/14/2024   GFRAA >60 12/08/2019   Lipid Panel: No results found for: LDLCALC HgbA1c: No results found for: HGBA1C Urine Drug Screen:     Component Value Date/Time   LABOPIA NONE DETECTED 04/14/2024 0827   COCAINSCRNUR NONE DETECTED 04/14/2024 0827   LABBENZ POSITIVE (A) 04/14/2024 0827   AMPHETMU NONE DETECTED 04/14/2024 0827   THCU POSITIVE (A) 04/14/2024 0827   LABBARB NONE DETECTED 04/14/2024 0827    Alcohol Level     Component Value Date/Time   [facility] <15 04/13/2024 1924   INR  Lab Results  Component Value Date   INR 1.0 12/08/2019   APTT  Lab Results  Component Value Date   APTT 26 12/08/2019   AED levels: No results found for: PHENYTOIN, ZONISAMIDE, LAMOTRIGINE, LEVETIRACETA   CT Head without contrast 8/12 (Personally reviewed): 1. No acute intracranial abnormality. 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 2. Partially visible right OMC obstructive pattern of paranasal sinus disease, new since 2021. Chronic left lamina papyracea fracture   CT Cervical Spine wo Contrast 8/12: Negative CT appearance of the Cervical spine. Mild for age cervical spine degeneration.   CT Head without contrast 9/28 (Personally reviewed): 1. No acute intracranial process. 2. Stable right maxillary sinus disease   CT Cervical Spine wo Contrast 9/28: 1. No evidence of acute cervical spine fracture, traumatic subluxation or static signs of instability. 2. No significant spondylosis or evidence of large disc herniation.   MR Cervical Spine w wo Contrast: Normal for age MRI appearance of the Cervical Spine    MRI Brain w wo Contrast: 1. No acute intracranial abnormality.  Small chronic left cerebellar infarct. Otherwise negative for age MRI appearance of the brain. 2. Right OMC obstructive pattern of paranasal sinus disease stable from CT last month.  MR Lumbar Spine w wo Contrast: Ordered  MR Thoracic Spine w wo Contrast: Ordered   Assessment  SALEAH RISHEL is a 53 y.o. female with a hx of anemia, vertigo, alcohol abuse, and polyneuropathy who presented to the ED on 9/28 with CC of dental pain and loss of balance. She was having difficulty walking and feeling paresthesias in all extremities. She drinks alcohol daily and family member reports she eats very little. Vitamin levels were drawn and patient was admitted for further evaluation. She is having ongoing pain in her right upper molars where she states she had a dental abscess that has been present for months and has since burst. She attributes some of her lack of meals from pain with eating. She also endorses sensory numbness in both hands and knees and tingling in her hands and lower legs. -Physical examination reveals sensory changes on right side of cheek,  improved extremity strength, left arm drift, decreased sensation on left sided extremities, delayed RAM, brachioradialis and patellar reflexes intact, and improved ambulation with use of a walker, no overt ataxia present.  -Patient's lab work significant for low folate at <3.0, low B12 171,  MCV 112.4, high homocysteine 141, consistent with macrocytic anemia.  -Patient's previous two CTs of brain without contrast and CTs of cervical spine without contrast were negative with the exception of a small chronic appearing left cerebellar infarct that is new since 2021 and right maxillary sinus disease.  - MRI of brain notes no acute intracranial abnormality and a small chronic left cerebellar infarct. Cervical spine resulted as normal for age.  -MRI of thoracic and lumbar spine w/wo contrast ordered to rule out potential cord lesion, spinal stenosis, or  other structural abnormality that may explain her BLE numbness and weakness  -Low B12 may the etiology for or contributing, in conjunction with other factors, to the patient's neuropathic symptoms.  - Impression: New-onset gait abnormality and paresthesias with numbness. No brain or cervical spine lesions to explain her presentation. DDx includes a compressive myelopathy, nutritional myelopathy and possible intrinsic spinal cord or compressive cauda equina lesion. Lumbosacral plexopathy and motor/sensory polyneuropathy are also considerations.   Recommendations  - MRI thoracic and lumbar spine w/wo contrast (orders placed) - Continue vitamin supplementation (thiamine , folate, B12) - CIWA protocol  Addendum:  - Patient discharged home by Hospitalist service prior to completion of thoracic and lumbar spine MRI.   - Discussed with Hospitalist service and we are in agreement that the patient should be readmitted for completion of these studies.  - Attempt by attending Neurologist to call the patient at her listed home phone number, and one attempt to contact her mother, both without answer. Will make another attempt later today.  ______________________________________________________________________   Signed, Nidia Bunker, Student-PA [facility]    Electronically signed: Dr. Millette Halberstam

## 2024-04-16 LAB — VITAMIN B6: Vitamin B6: 2.3 ug/L — ABNORMAL LOW (ref 3.4–65.2)

## 2024-04-16 LAB — VITAMIN B1: Vitamin B1 (Thiamine): 79.8 nmol/L (ref 66.5–200.0)

## 2024-04-17 ENCOUNTER — Other Ambulatory Visit (HOSPITAL_BASED_OUTPATIENT_CLINIC_OR_DEPARTMENT_OTHER): Payer: Self-pay

## 2024-04-17 ENCOUNTER — Telehealth: Payer: Self-pay | Admitting: Neurology

## 2024-04-17 LAB — METHYLMALONIC ACID, SERUM: Methylmalonic Acid, Quantitative: 155 nmol/L (ref 0–378)

## 2024-04-17 MED ORDER — VITAMIN B-6 50 MG PO TABS
50.0000 mg | ORAL_TABLET | Freq: Every day | ORAL | 3 refills | Status: AC
  Filled 2024-04-17: qty 100, 100d supply, fill #0

## 2024-04-17 NOTE — Telephone Encounter (Signed)
 Called Veronica Gaines to let her know that her Vitamin B6 levels were low. I sent a script for Pyridoxine (90 day supply with 3 refills) to [facility] at [facility] [place] at her request.  I let her know that this may be contributing to her neuropathic symptoms.  Veronica Gaines [facility]

## 2024-04-17 NOTE — Plan of Care (Signed)
 Patient is anticipating arriving tomorrow to the ED by POV for MRI of T-spine and L-spine with and without contrast. Will need a Cr level drawn on arrival. I have requested her to ask ED provider to please call Neurology when she arrives. I am also calling ED to notify them.   Electronically signed: Dr. Ozell Ferrera

## 2024-04-19 ENCOUNTER — Encounter (HOSPITAL_COMMUNITY): Payer: Self-pay

## 2024-04-19 ENCOUNTER — Other Ambulatory Visit: Payer: Self-pay

## 2024-04-19 ENCOUNTER — Emergency Department (HOSPITAL_COMMUNITY)
Admission: EM | Admit: 2024-04-19 | Discharge: 2024-04-19 | Disposition: A | Discharge status: Home / Self Care | Payer: MEDICAID | Attending: Emergency Medicine | Admitting: Emergency Medicine

## 2024-04-19 ENCOUNTER — Emergency Department (HOSPITAL_COMMUNITY): Payer: MEDICAID

## 2024-04-19 DIAGNOSIS — Z7982 Long term (current) use of aspirin: Secondary | ICD-10-CM | POA: Insufficient documentation

## 2024-04-19 DIAGNOSIS — G629 Polyneuropathy, unspecified: Secondary | ICD-10-CM | POA: Insufficient documentation

## 2024-04-19 DIAGNOSIS — M542 Cervicalgia: Secondary | ICD-10-CM | POA: Diagnosis not present

## 2024-04-19 DIAGNOSIS — R202 Paresthesia of skin: Secondary | ICD-10-CM | POA: Diagnosis present

## 2024-04-19 DIAGNOSIS — R269 Unspecified abnormalities of gait and mobility: Secondary | ICD-10-CM | POA: Diagnosis not present

## 2024-04-19 LAB — BASIC METABOLIC PANEL WITH GFR
Anion gap: 18 — ABNORMAL HIGH (ref 5–15)
BUN: 8 mg/dL (ref 6–20)
CO2: 22 mmol/L (ref 22–32)
Calcium: 9.2 mg/dL (ref 8.9–10.3)
Chloride: 95 mmol/L — ABNORMAL LOW (ref 98–111)
Creatinine, Ser: 0.81 mg/dL (ref 0.44–1.00)
GFR, Estimated: >60 mL/min (ref >60)
Glucose, Bld: 76 mg/dL (ref 70–99)
Potassium: 3.5 mmol/L (ref 3.5–5.1)
Sodium: 135 mmol/L (ref 135–145)

## 2024-04-19 LAB — CBC
HCT: 42.2 % (ref 36.0–46.0)
Hemoglobin: 13.7 g/dL (ref 12.0–15.0)
MCH: 37.5 pg — ABNORMAL HIGH (ref 26.0–34.0)
MCHC: 32.5 g/dL (ref 30.0–36.0)
MCV: 115.6 fL — ABNORMAL HIGH (ref 80.0–100.0)
Platelets: 333 K/uL (ref 150–400)
RBC: 3.65 MIL/uL — ABNORMAL LOW (ref 3.87–5.11)
RDW: 13.3 % (ref 11.5–15.5)
WBC: 6.6 K/uL (ref 4.0–10.5)
nRBC: 0 % (ref 0.0–0.2)

## 2024-04-19 MED ORDER — GADOBUTROL 1 MMOL/ML IV SOLN
7.0000 mL | Freq: Once | INTRAVENOUS | Status: AC | PRN
  Administered 2024-04-19: 7 mL via INTRAVENOUS

## 2024-04-19 NOTE — ED Provider Notes (Signed)
 [place] EMERGENCY DEPARTMENT AT [facility] Provider Note   CSN: [id number] Arrival date & time: 04/19/24  9053     Patient presents with: Extremity Weakness   Veronica Gaines is a 53 y.o. female.  53 year old female presents to ED with complaints of extremity numbness and tingling in all extremities since August 12.  Patient reports she was seen in the ED and admitted following a stroke workup which she advised she had a TIA.  Patient also reports history of EtOH use with her last drink being yesterday.  Patient denies any tremors, visual disturbances, dizziness, shortness of breath, chest pain.  Patient does have associated dry mouth.  Patient advises she has talked with Dr. Lindzen who has placed orders for a MRI of spine and lumbar due to continuing symptoms.  Patient advised she is taking all medications as prescribed currently.     Prior to Admission medications   Medication Sig Start Date End Date Taking? Authorizing Provider  acetaminophen  (TYLENOL ) 500 MG tablet Take 1 tablet (500 mg total) by mouth every 6 (six) hours as needed. 09/20/22   Charlyn Sora, MD  aspirin EC 81 MG tablet Take 81 mg by mouth daily. Swallow whole.    [provider]  cyanocobalamin (VITAMIN B12) 1000 MCG tablet Take 1 tablet (1,000 mcg total) by mouth daily. 04/15/24 07/14/24  Vernon Ranks, MD  diphenhydrAMINE  (BENADRYL ) 25 mg capsule Take 25 mg by mouth at bedtime as needed for sleep.    [provider]  folic acid (FOLVITE) 1 MG tablet Take 1 tablet (1 mg total) by mouth daily. 04/16/24 07/15/24  Vernon Ranks, MD  magnesium oxide (MAG-OX) 400 (240 Mg) MG tablet Take 400 mg by mouth daily.    [provider]  pyridOXINE (VITAMIN B6) 50 MG tablet Take 1 tablet (50 mg total) by mouth daily. 04/17/24   Khaliqdina, Salman, MD  thiamine  (VITAMIN B1) 100 MG tablet Take 1 tablet (100 mg total) by mouth daily. 04/15/24 07/14/24  Vernon Ranks, MD  vitamin B-12  (CYANOCOBALAMIN) 100 MCG tablet Take 100 mcg by mouth daily.    [provider]    Allergies: Patient has no known allergies.    Review of Systems  Neurological:  Positive for weakness.  All other systems reviewed and are negative.   Updated Vital Signs BP (!) 125/91   Pulse 74   Temp 97.8 F (36.6 C) (Oral)   Resp 18   Ht 5' 7 (1.702 m)   Wt 101.6 kg   SpO2 100%   BMI 35.08 kg/m   Physical Exam Vitals and nursing note reviewed.  Constitutional:      Appearance: Normal appearance.  HENT:     Head: Normocephalic and atraumatic.     Nose: Nose normal.  Eyes:     General: No visual field deficit or scleral icterus.    Extraocular Movements: Extraocular movements intact.     Conjunctiva/sclera: Conjunctivae normal.     Pupils: Pupils are equal, round, and reactive to light.  Neck:     Comments: Some mild pain with neck movement which has been going on since August as well. Cardiovascular:     Rate and Rhythm: Normal rate.  Pulmonary:     Effort: Pulmonary effort is normal. No respiratory distress.  Abdominal:     Tenderness: There is no abdominal tenderness. There is no guarding.  Musculoskeletal:        General: Normal range of motion.     Cervical  back: Normal range of motion.  Neurological:     General: No focal deficit present.     Mental Status: She is alert and oriented to person, place, and time.     Cranial Nerves: No cranial nerve deficit, dysarthria or facial asymmetry.     Sensory: Sensory deficit present.     Motor: Weakness present.     Coordination: Romberg sign negative.     Gait: Gait abnormal.     Comments: Numbness and tingling in all extremities since August.  Patient also endorses ambulation with walker.  Psychiatric:        Mood and Affect: Mood normal.        Behavior: Behavior normal.     (all labs ordered are listed, but only abnormal results are displayed) Labs Reviewed  BASIC METABOLIC PANEL WITH GFR - Abnormal; Notable for  the following components:      Result Value   Chloride 95 (*)    Anion gap 18 (*)    All other components within normal limits  CBC - Abnormal; Notable for the following components:   RBC 3.65 (*)    MCV 115.6 (*)    MCH 37.5 (*)    All other components within normal limits    EKG: None  Radiology: MR Lumbar Spine W Wo Contrast Result Date: 04/19/2024 CLINICAL DATA:  Provided history: Myelopathy versus cauda equina syndrome. EXAM: MRI LUMBAR SPINE WITHOUT AND WITH CONTRAST TECHNIQUE: Multiplanar and multiecho pulse sequences of the lumbar spine were obtained without and with intravenous contrast. CONTRAST:  7mL GADAVIST GADOBUTROL 1 MMOL/ML IV SOLN COMPARISON:  None. FINDINGS: Segmentation: 5 lumbar vertebrae. The caudal most well-formed intervertebral disc space is designated L5-S1. Alignment:  No significant spondylolisthesis or bony retropulsion. Vertebrae: Chronic L1 vertebral compression fracture (with up to 60-70% vertebral body height loss). No bony retropulsion. Lumbar vertebral body height is otherwise maintained. No significant marrow edema. Conus medullaris and cauda equina: Conus extends to the L2 level. No signal abnormality identified within the visualized distal spinal cord. No pathologic enhancement of the visualized distal spinal cord or cauda equina nerve roots. Paraspinal and other soft tissues: No acute finding within included portions of the abdomen/retroperitoneum. No paraspinal mass or collection. Disc levels: Mild-to-moderate disc degeneration at T12-L1 and L1-L2. No more than mild disc degeneration at the remaining lumbar levels. T12-L1: No significant disc herniation or stenosis. L1-L2: Small disc bulge. No significant spinal canal or foraminal stenosis. L2-L3: Broad-based left foraminal disc protrusion. Mild facet and ligamentum flavum hypertrophy. No significant spinal canal or foraminal stenosis. L3-L4: Shallow broad-based left foraminal disc protrusion. Facet  arthropathy (greater on the right and moderate on the right). Ligamentum flavum hypertrophy. No significant spinal canal stenosis. Mild left neural foraminal narrowing. L4-L5: Facet arthropathy (greater on the left and moderate on the left). No significant disc herniation, spinal canal stenosis or neural foraminal narrowing. L5-S1: No significant disc herniation or stenosis. IMPRESSION: 1. Chronic L1 vertebral compression fracture (with up to 60-70% vertebral body height loss). No bony retropulsion. 2. Lumbar spondylosis as outlined within the body of the report. No significant spinal canal stenosis. At L3-L4, a left foraminal disc protrusion contributes to mild left neural foraminal narrowing. 3. No pathologic enhancement of the cauda equina nerve roots. Electronically Signed   By: Rockey Childs D.O.   On: 04/19/2024 15:09   MR THORACIC SPINE W WO CONTRAST Result Date: 04/19/2024 CLINICAL DATA:  Provided history: Myelopathy. EXAM: MRI THORACIC WITHOUT AND WITH CONTRAST TECHNIQUE: Multiplanar and  multiecho pulse sequences of the thoracic spine were obtained without and with intravenous contrast. CONTRAST:  7mL GADAVIST GADOBUTROL 1 MMOL/ML IV SOLN COMPARISON:  None. FINDINGS: Alignment: No significant spondylolisthesis. Vertebrae: No thoracic vertebral compression fracture. No significant marrow edema or focal worrisome marrow lesion. T11 vertebral body hemangioma. Cord: No signal abnormality identified within the thoracic spinal cord. No pathologic spinal cord enhancement. Paraspinal and other soft tissues: No acute finding within included portions of the thorax or upper abdomen/retroperitoneum. No paraspinal mass or collection. Disc levels: No more than mild disc degeneration. No significant disc herniation, spinal canal stenosis or neural foraminal narrowing. IMPRESSION: 1. Unremarkable MRI appearance of the thoracic spinal cord. 2. No more than mild disc degeneration. No significant disc herniation, spinal  canal stenosis or neural foraminal narrowing. Electronically Signed   By: Rockey Childs D.O.   On: 04/19/2024 14:58     Procedures   Medications Ordered in the ED  gadobutrol (GADAVIST) 1 MMOL/ML injection 7 mL (7 mLs Intravenous Contrast Given 04/19/24 1414)    53 y.o. female presents to the ED with complaints of extremities, this involves an extensive number of treatment options, and is a complaint that carries with it a high risk of complications and morbidity.  The differential diagnosis includes CVA, cauda equina, degenerative disc, spinal canal stenosis, neurovascular compression or injury, vitamin deficiency (Ddx)  On arrival pt is nontoxic, vitals hypertensive on arrival. Exam significant for reported numbness in extremities with reported dulled sensation.  No focal deficits.  Negative Romberg  Additional history obtained from chart review Dr. Merrianne has requested she go to ED POV for MRI of T-spine L-spine with without contrast.   Lab Tests:  I Ordered, reviewed, and interpreted labs, which included: BMP, CBC  Imaging Studies ordered:  MRI of thoracic spine and lumbar spine with without contrast was ordered prior to arrival to ED by Dr. Lindzen.  ED Course:   53 year old female presents to ED with complaints of extremity numbness in all extremities since August.  Patient has associated dry mouth and reports she has to use a walker now due to neuropathy.  Patient has been seen for same by ED and neurology.  Patient is here today for MRI ordered by Dr. Merrianne of T-spine and L-spine.  Patient also has significant EtOH history and reported last drink was yesterday.  Patient had a recent workup and placed on B12 folate B6 and B1.  No tremors on exam today.  No confusion, hallucinations, visual disturbances.  Patient has no other complaints including chest pain dizziness or headache.  Patient is sitting comfortably in ED bed in no acute distress nontoxic-appearing.  Patient is concerned  about length of symptoms.  On neuroexam patient has no cranial nerve deficits noted.  She has equal strength bilaterally in upper extremities and lower extremities.  Patient is able to stand without assistance and negative Romberg.  Patient does have reported dulled sensation and numbness in all 4 extremities.  Dr. Lindzen was notified patient is in ED and will be advised on MRI results.  Dr. Lindzen was advised of findings and advised for her to follow-up in outpatient.  Patient reported she cannot get an appointment with [facility] neurology until December.  Patient was given [facility] neuro referral for further follow-up.  Patient was comfortable with treatment plan and comfortable discharge.  Portions of this note were generated with Scientist, clinical (histocompatibility and immunogenetics). Dictation errors may occur despite best attempts at proofreading.  Final diagnoses:  Neuropathy  ED Discharge Orders          Ordered    Ambulatory referral to Neurology       Comments: An appointment is requested in approximately: 1 week   04/19/24 1542               Myriam Fonda GORMAN DEVONNA 04/19/24 2151    Yolande Lamar BROCKS, MD 04/24/24 [phone]

## 2024-04-19 NOTE — Discharge Instructions (Addendum)
 MRI was discussed with your neurology team and they advised it is safe for you to follow-up with them outside of the hospital.  MRI showed L1 chronic compression fracture.  The full report can be found on your MyChart.  Your lab work was reassuring today as well.  Please return to the ED for any concerning symptoms.  It is advised to continue to follow up with neurology and follow all recommendations.  I have placed a neurology follow-up as well.

## 2024-04-19 NOTE — ED Triage Notes (Signed)
 Patient has been here several times for falls related to vitamin deficiencies secondary to alcoholism.  Patient reports she is still having issues with legs and hands reports tremors and jerking.  Patient reports she spoke to Dr Merrianne who is going to order more MRIs.  Patient even states I just spoke with him in the lobby.

## 2024-04-23 ENCOUNTER — Other Ambulatory Visit (HOSPITAL_COMMUNITY): Payer: Self-pay

## 2024-06-10 NOTE — Therapy (Signed)
 OUTPATIENT PHYSICAL THERAPY NEURO EVALUATION   Patient Name: Veronica Gaines MRN: [id number] DOB:[birth year], 53 y.o., female Today's Date: 06/10/2024   END OF SESSION:   Past Medical History:  Diagnosis Date   Alcohol abuse    Anemia    Polyneuropathy    Vertigo    Past Surgical History:  Procedure Laterality Date   CESAREAN SECTION     CHOLECYSTECTOMY     Patient Active Problem List   Diagnosis Date Noted   Folate deficiency 04/15/2024   Alcohol withdrawal (HCC) 04/13/2024   Recurrent falls 04/13/2024   Neuropathy 04/12/2024   Hamstring strain, right, subsequent encounter 03/30/2021    PCP: Patient, No Pcp Per   REFERRING PROVIDER: Vernon Ranks, MD   REFERRING DIAG: G62.9 (ICD-10-CM) - Neuropathy R29.898 (ICD-10-CM) - Leg weakness, bilateral  THERAPY DIAG:  No diagnosis found.  RATIONALE FOR EVALUATION AND TREATMENT: Rehabilitation  ONSET DATE: 8/25  NEXT MD VISIT:    SUBJECTIVE:                                                                                                                                                                                                         SUBJECTIVE STATEMENT: 53 y/o patient referred to PT following a hospital D/C  for weakness.  She reports she was diagnosed with a severe B12 deficiency which caused open skin lesions on her feet and around her mouth.  She reports she was ambulating independently and even working up until August this year, but then starting getting weaker and weaker and progressively more sedentary to the point that it sounds like she was not taking care of herself very well.     She has had multiple falls at home and has dx of ETOH abuse.  She admits that she does struggle with alcohol use.   There is mention of a small cerebellar CVA in her hospital dc summary, but the time of the CVA was unknown.  The patient reports she is extremely weak and comes into the clinic in a wheelchair from the lobby.   She  reports she has a walker at home.   She is accompanied by her dtr and her aunt.    She lives with her mother who is still in good health and ambulatory.   Patient reports they live in a 2 story home with steps up to the bedroom.   She states she has to sit and scoot up steps.   She has a walker and shower chair at home.  Patient also reports a lot of tingling,  burning, numbness in her hands and feet/lower legs with hand weakness and difficulty gripping objects very well.   She has a dx of neuropathy.  Pt accompanied by: family member  PAIN: Are you having pain? Yes: NPRS scale: 6/10 Pain location: feet/legs Pain description: tingling, burning Aggravating factors: nothing particular other than when feet get cold Relieving factors: nothing particular  PERTINENT HISTORY:  Alcohol abuse, neuropathy, vertigo, falls  PRECAUTIONS: Fall  RED FLAGS: None  WEIGHT BEARING RESTRICTIONS: No  FALLS:  Has patient fallen in last 6 months? Yes. Number of falls unknown  LIVING ENVIRONMENT: Lives with: lives with their family Lives in: House/apartment Stairs: Yes: Internal: 12 steps; on right going up Has following equipment at home: Walker - 2 wheeled and shower chair  OCCUPATION: unable to work at this time  PLOF: Independent with gait  PATIENT GOALS: get strength back   OBJECTIVE: (objective measures completed at initial evaluation unless otherwise dated)  DIAGNOSTIC FINDINGS:  EXAM: MRI CERVICAL SPINE WITHOUT AND WITH CONTRAST   TECHNIQUE: Multiplanar and multiecho pulse sequences of the cervical spine, to include the craniocervical junction and cervicothoracic junction, were obtained without and with intravenous contrast.   CONTRAST:  10mL GADAVIST  GADOBUTROL  1 MMOL/ML IV SOLN   COMPARISON:  Cervical spine CT last month.   Brain MRI today reported separately.   FINDINGS: Alignment: Stable straightening, minor reversal of normal cervical lordosis.   Vertebrae: Background  bone marrow signal is within normal limits. Maintained vertebral height. No marrow edema or evidence of acute osseous abnormality. No abnormal enhancement identified.   Cord: Normal. Capacious spinal canal. No abnormal intradural enhancement or dural thickening identified.   Posterior Fossa, vertebral arteries, paraspinal tissues: Cervicomedullary junction is within normal limits. Brain is reported separately today. Maintained major vascular flow voids in the bilateral neck. Negative visible neck soft tissues.   Disc levels:   Capacious spinal canal and mild for age cervical spine degeneration.   There is evidence of a small broad-based central disc protrusion at C3-C4 (series 14, image 9). But no spinal stenosis.   No neural foraminal foraminal stenosis.   Grossly negative visible upper thoracic spine.   IMPRESSION: Normal for age MRI appearance of the Cervical Spine.     Electronically Signed   By: VEAR Hurst M.D.   On: 04/14/2024 12:55EXAM: MRI THORACIC WITHOUT AND WITH CONTRAST   TECHNIQUE: Multiplanar and multiecho pulse sequences of the thoracic spine were obtained without and with intravenous contrast.   CONTRAST:  7mL GADAVIST  GADOBUTROL  1 MMOL/ML IV SOLN   COMPARISON:  None.   FINDINGS: Alignment: No significant spondylolisthesis.   Vertebrae: No thoracic vertebral compression fracture. No significant marrow edema or focal worrisome marrow lesion. T11 vertebral body hemangioma.   Cord: No signal abnormality identified within the thoracic spinal cord. No pathologic spinal cord enhancement.   Paraspinal and other soft tissues: No acute finding within included portions of the thorax or upper abdomen/retroperitoneum. No paraspinal mass or collection.   Disc levels:   No more than mild disc degeneration. No significant disc herniation, spinal canal stenosis or neural foraminal narrowing.   IMPRESSION: 1. Unremarkable MRI appearance of the thoracic spinal  cord. 2. No more than mild disc degeneration. No significant disc herniation, spinal canal stenosis or neural foraminal narrowing.     Electronically Signed   By: Rockey Childs D.O.   On: 04/19/2024 14:58  EXAM: MRI LUMBAR SPINE WITHOUT AND WITH CONTRAST   TECHNIQUE: Multiplanar and multiecho pulse sequences of the  lumbar spine were obtained without and with intravenous contrast.   CONTRAST:  7mL GADAVIST  GADOBUTROL  1 MMOL/ML IV SOLN   COMPARISON:  None.   FINDINGS: Segmentation: 5 lumbar vertebrae. The caudal most well-formed intervertebral disc space is designated L5-S1.   Alignment:  No significant spondylolisthesis or bony retropulsion.   Vertebrae: Chronic L1 vertebral compression fracture (with up to 60-70% vertebral body height loss). No bony retropulsion. Lumbar vertebral body height is otherwise maintained. No significant marrow edema.   Conus medullaris and cauda equina: Conus extends to the L2 level. No signal abnormality identified within the visualized distal spinal cord. No pathologic enhancement of the visualized distal spinal cord or cauda equina nerve roots.   Paraspinal and other soft tissues: No acute finding within included portions of the abdomen/retroperitoneum. No paraspinal mass or collection.   Disc levels:   Mild-to-moderate disc degeneration at T12-L1 and L1-L2. No more than mild disc degeneration at the remaining lumbar levels.   T12-L1: No significant disc herniation or stenosis.   L1-L2: Small disc bulge. No significant spinal canal or foraminal stenosis.   L2-L3: Broad-based left foraminal disc protrusion. Mild facet and ligamentum flavum hypertrophy. No significant spinal canal or foraminal stenosis.   L3-L4: Shallow broad-based left foraminal disc protrusion. Facet arthropathy (greater on the right and moderate on the right). Ligamentum flavum hypertrophy. No significant spinal canal stenosis. Mild left neural foraminal  narrowing.   L4-L5: Facet arthropathy (greater on the left and moderate on the left). No significant disc herniation, spinal canal stenosis or neural foraminal narrowing.   L5-S1: No significant disc herniation or stenosis.   IMPRESSION: 1. Chronic L1 vertebral compression fracture (with up to 60-70% vertebral body height loss). No bony retropulsion. 2. Lumbar spondylosis as outlined within the body of the report. No significant spinal canal stenosis. At L3-L4, a left foraminal disc protrusion contributes to mild left neural foraminal narrowing. 3. No pathologic enhancement of the cauda equina nerve roots.     Electronically Signed   By: Rockey Childs D.O.   On: 04/19/2024 15:09  EXAM: MRI HEAD WITHOUT AND WITH CONTRAST   TECHNIQUE: Multiplanar, multiecho pulse sequences of the brain and surrounding structures were obtained without and with intravenous contrast.   CONTRAST:  10mL GADAVIST  GADOBUTROL  1 MMOL/ML IV SOLN   COMPARISON:  Head CT 04/12/2024 and earlier.   FINDINGS: Brain: No restricted diffusion to suggest acute infarction. No midline shift, mass effect, evidence of mass lesion, ventriculomegaly, extra-axial collection or acute intracranial hemorrhage. Cervicomedullary junction and pituitary are within normal limits.   Cerebral volume may be at the lower limits of normal for age. No disproportionate regional brain atrophy is identified. Largely normal for age gray and white matter signal throughout the brain. Chronic left cerebellar infarct series 5, image 7, unchanged from CT last month. No cerebral cortical encephalomalacia. No chronic cerebral blood products identified. No abnormal enhancement identified. No dural thickening.   Vascular: Major intracranial vascular flow voids are preserved. Following contrast major dural venous sinuses are enhancing and appear to be patent.   Skull and upper cervical spine: Cervical spine MRI reported separately today.  Visualized bone marrow signal is within normal limits.   Sinuses/Orbits: Orbits appear negative. Right OMC obstructive pattern of paranasal sinus disease stable from last month. Other paranasal sinuses well aerated.   Other: Trace mastoid fluid. Negative visible nasopharynx. Visible internal auditory structures are grossly normal. Negative visible scalp and face.   IMPRESSION: 1. No acute intracranial abnormality. Small chronic left cerebellar  infarct. Otherwise negative for age MRI appearance of the brain. 2. Right OMC obstructive pattern of paranasal sinus disease stable from CT last month. 3. Cervical spine MRI reported separately.     Electronically Signed   By: VEAR Hurst M.D.   On: 04/14/2024 12:50  COGNITION: Overall cognitive status: Within functional limits for tasks assessed   SENSATION: Impaired per patient  COORDINATION: Finger to nose and heel to shin are somewhat ataxic, but not severe  EDEMA:  None noted  INTEGUMENTARY:  inspected patient's feel today and she has large healed areas where it appears her skin was denuded.   However, these are fully healed.   There is very dry, cracked, and scaly skin present and patient is advised to do better skin care with moisturizing lotion for these areas, but not to pull the skin off rather allowing it to scale off on it's own.  She is advised to see her PCP immediately if she gets any open wounds on her feet/ankles.  MUSCLE TONE: Some tremulousness is present with intentional movements in BUE and BLE but none at rest  DTRs:  NT  POSTURE:  rounded shoulders, forward head, and decreased lumbar lordosis   LOWER EXTREMITY ROM:     Active  Right eval Left eval  Hip flexion    Hip extension    Hip abduction    Hip adduction    Hip internal rotation    Hip external rotation    Knee flexion    Knee extension    Ankle dorsiflexion    Ankle plantarflexion    Ankle inversion    Ankle eversion     (Blank rows = not  tested)  LOWER EXTREMITY MMT:    MMT Right eval Left eval  Hip flexion 4- 4-  Hip extension    Hip abduction    Hip adduction    Hip internal rotation 4- 4-  Hip external rotation 4- 4-  Knee flexion 4- 4-  Knee extension 4 4  Ankle dorsiflexion 4- 4-  Ankle plantarflexion NT NT (due to recently healed wounds)  Ankle inversion    Ankle eversion    (Blank rows = not tested)  BED MOBILITY:  NT today  TRANSFERS: Assistive device utilized: Environmental Consultant - 2 wheeled  Sit to stand: Min A Stand to sit: Min A Chair to chair: Min A Floor: n/a--unsafe to test  GAIT: Distance walked: 20 feet in clinic with walker Assistive device utilized: Environmental Consultant - 2 wheeled Level of assistance: Min A Gait pattern: decreased step length- Right, decreased step length- Left, poor foot clearance- Right, and poor foot clearance- Left Comments: very weak, slow, shuffling gait with walker with extreme fatigue after 20 feet requiring seated rest   FUNCTIONAL TESTS:   TUG = 44.73 sec 5X STS = 47.71 Gait speed = 0.85 ft/sec   PATIENT SURVEYS:  LEFS  Extreme difficulty/unable (0), Quite a bit of difficulty (1), Moderate difficulty (2), Little difficulty (3), No difficulty (4) Survey date:  06/18/24  Any of your usual work, housework or school activities 0  2. Usual hobbies, recreational or sporting activities 0  3. Getting into/out of the bath 0  4. Walking between rooms 1  5. Putting on socks/shoes 1  6. Squatting  0  7. Lifting an object, like a bag of groceries from the floor 0  8. Performing light activities around your home 1  9. Performing heavy activities around your home 0  10. Getting into/out of a car  1  11. Walking 2 blocks 0  12. Walking 1 mile 0  13. Going up/down 10 stairs (1 flight) 2  14. Standing for 1 hour 1  15.  sitting for 1 hour 3  16. Running on even ground 0  17. Running on uneven ground 0  18. Making sharp turns while running fast 0  19. Hopping  0  20. Rolling over in  bed 1  Score total:  11      TODAY'S TREATMENT:  06/18/24 SELF CARE: Provided education on PT POC progression., initial HEP, not to ambulate unassisted in the home, assist for all ADLS, fall precautions in the home   PATIENT EDUCATION:  Education details: PT eval findings, anticipated POC, and initial HEP  Person educated: Patient and Child(ren) Education method: Explanation, Demonstration, Verbal cues, Tactile cues, Handouts, and MedBridgeGO app access provided Education comprehension: verbalized understanding, verbal cues required, tactile cues required, and needs further education  HOME EXERCISE PROGRAM: Access Code: [email or web address] URL: [email or web address] Date: 06/19/2024 Prepared by: Garnette Montclair  Exercises - Supine Bridge  - 1 x daily - 7 x weekly - 3 sets - 10 reps - Supine Straight Leg Raises  - 1 x daily - 7 x weekly - 3 sets - 10 reps - Clamshell  - 1 x daily - 7 x weekly - 3 sets - 10 reps - Supine Hip Abduction  - 1 x daily - 7 x weekly - 3 sets - 10 reps - Seated Long Arc Quad  - 1 x daily - 7 x weekly - 3 sets - 10 reps - Standing March with Counter Support  - 1 x daily - 7 x weekly - 3 sets - 10 reps - Standing Hip Abduction with Counter Support  - 1 x daily - 7 x weekly - 3 sets - 10 reps   ASSESSMENT:  CLINICAL IMPRESSION: DEVLYNN KNOFF is a 53 y.o. female who was referred to physical therapy for evaluation and treatment for neuropathy and BLE weakness.   Patient was recently hospitalized for B12 deficiency and frequent falls.   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 Patient has been struggling with alcohol abuse and likely not caring very well for herself.   Her BUE/BLE neuropathy have progressed to the point that she hasn't been walking due to falling and fear of falling.   She is able to ambulate minimally with a FWW today but very fatigued.   She has severe deconditioning and weakness in the setting of B12 deficiency and  progressively worsening neuropathy.   There is some mild ataxic movement noted likely due to the old CVA, but it is not severe.    Patient presents with physical impairments of impaired activity tolerance, impaired standing balance, impaired ambulation, and decreased safety awareness impacting safe and independent functional mobility.  Examination revealed patient is at risk for falls and functional decline as evidenced by the following objective test measures: Gait speed 0.85 ft/sec with walker, (2.62 ft/sec is needed for community access),  TUG of 44.73 sec (>13.5 sec indicates increased risk for falls), and 5xSTS of 47.71 sec (>15 sec indicates increased risk for falls and decreased BLE power).  ABC scale score was not completed in the waiting room, but we will get this done in future.   Instead patient completed the LEFS outcome score which was 11/80 indicating a severe functional handicap/deficit.  Evelyn will benefit from skilled PT to address above deficits to improve mobility and activity tolerance  to help reach the maximal level of functional independence and mobility. Patient demonstrates understanding of this POC and is in agreement with this plan.    Of note, the patient may have difficulty getting to outpatient PT appointments and I have recommend she request her PCP to order Home Health PT/OT for her if she is not able to come in to see us  for therapy.  She and her dtr both verbalize understanding with [facility] PT/OT as an option if needed.  OBJECTIVE IMPAIRMENTS: Abnormal gait, decreased balance, decreased endurance, decreased knowledge of condition, decreased knowledge of use of DME, difficulty walking, decreased strength, and pain.   ACTIVITY LIMITATIONS: carrying, lifting, bending, standing, squatting, stairs, transfers, bed mobility, continence, bathing, toileting, dressing, hygiene/grooming, and locomotion level  PARTICIPATION LIMITATIONS: meal prep, cleaning, laundry, medication management,  driving, shopping, and community activity  PERSONAL FACTORS: Fitness, Past/current experiences, and 1-2 comorbidities: alcohol abuse, polyneuropathy, B12 deficiency are also affecting patient's functional outcome.   REHAB POTENTIAL: Good  CLINICAL DECISION MAKING: Evolving/moderate complexity  EVALUATION COMPLEXITY: Moderate   GOALS: Goals reviewed with patient? Yes  SHORT TERM GOALS: Target date: 07/17/2024   Patient will be independent with initial HEP to improve outcomes and carryover.  Baseline: 100% PT assist required for correct completion Goal status: INITIAL  2.  Patient will be educated on strategies to decrease risk of falls.  Baseline: some education provided on eval, but needs handouts Goal status: INITIAL  3.  Patient will demonstrate decreased TUG time to </= 30 sec to decrease risk for falls with transitional mobility. Baseline: 44.73 Goal status: INITIAL  LONG TERM GOALS: Target date: 08/14/2024   Patient will be independent with advanced/ongoing HEP to facilitate ability to maintain/progress functional gains from skilled physical therapy services. Baseline: no advanced HEP yet Goal status: INITIAL  2.  Patient will be able to ambulate 5-10 min with or w/o LRAD on variable surfaces with good safety to access community.  Baseline: 20 feet, walker dependent Goal status: INITIAL  3.  Patient will be able to step up/down curb safely with LRAD for safety with community ambulation.  Baseline: TBD Goal status: INITIAL   4.  Patient will demonstrate improved B strength to >/= 4 to 4+/5 for improved stability and ease of mobility . Baseline: Refer to above LE MMT table Goal status: INITIAL  5.  Patient will improve 5xSTS time to </= 30 seconds for improved efficiency and safety with transfers. Baseline: 44.71 Goal status: INITIAL   6.  Patient will demonstrate gait speed of >/= 1.8 ft/sec (0.55 m/s) to be a safe limited community ambulator with decreased risk  for recurrent falls.  Baseline: 0.85 ft/sec Goal status: INITIAL  7.  Patient will improve Berg score to >/= 44/56 to improve safety and stability with ADLs in standing and reduce risk for falls. (MCID= 8 points)  Baseline: unable to safely complete Goal status: INITIAL  8.  Patient will demonstrate at least 19/24 on DGI to improve gait stability and reduce risk for falls. Baseline: unable to safely complete (pt. Too deconditioned) Goal status: INITIAL  9. Patient will improve FGA score to at least 19/30 to improve gait stability and reduce risk for falls. Baseline: unable, TBD Goal status: INITIAL  10.  Patient will report >/= 40% on ABC scale (MCID = 19%) to demonstrate improved balance confidence with functional mobility and gait. Baseline: TBD Goal status: INITIAL  11.  Patient will demonstrate >/= 30/80 on LEFS outcome survey for improved patient satisfaction, function,  and QOL  Baseline:  11/80   Goal status:  INITIAL   PLAN:  PT FREQUENCY: 1-2x/week  PT DURATION: 8 weeks  PLANNED INTERVENTIONS: [id number]- PT Re-evaluation, 97750- Physical Performance Testing, 97110-Therapeutic exercises, 97530- Therapeutic activity, [id number]- Neuromuscular re-education, 97535- Self Care, [id number]- Manual therapy, [phone]- Gait training, Patient/Family education, Balance training, and Stair training  PLAN FOR NEXT SESSION: progress strength, endurance, balance, gait   Aquarius Latouche, PT 06/10/2024, 9:21 PM

## 2024-06-18 ENCOUNTER — Other Ambulatory Visit: Payer: Self-pay

## 2024-06-18 ENCOUNTER — Ambulatory Visit: Admitting: Rehabilitation

## 2024-06-18 ENCOUNTER — Encounter: Payer: Self-pay | Admitting: Rehabilitation

## 2024-06-18 DIAGNOSIS — G629 Polyneuropathy, unspecified: Secondary | ICD-10-CM | POA: Diagnosis not present

## 2024-06-18 DIAGNOSIS — R2689 Other abnormalities of gait and mobility: Secondary | ICD-10-CM | POA: Insufficient documentation

## 2024-06-18 DIAGNOSIS — R29898 Other symptoms and signs involving the musculoskeletal system: Secondary | ICD-10-CM | POA: Insufficient documentation

## 2024-06-18 DIAGNOSIS — M6281 Muscle weakness (generalized): Secondary | ICD-10-CM | POA: Diagnosis present

## 2024-07-11 ENCOUNTER — Other Ambulatory Visit: Payer: Self-pay

## 2024-07-11 ENCOUNTER — Inpatient Hospital Stay (HOSPITAL_COMMUNITY)
Admission: EM | Admit: 2024-07-11 | Discharge: 2024-07-24 | DRG: 871 | Disposition: A | Discharge status: Skilled Nursing Facility | Attending: Internal Medicine | Admitting: Internal Medicine

## 2024-07-11 ENCOUNTER — Emergency Department (HOSPITAL_COMMUNITY)

## 2024-07-11 DIAGNOSIS — K6289 Other specified diseases of anus and rectum: Secondary | ICD-10-CM | POA: Diagnosis present

## 2024-07-11 DIAGNOSIS — Z9181 History of falling: Secondary | ICD-10-CM

## 2024-07-11 DIAGNOSIS — E876 Hypokalemia: Secondary | ICD-10-CM | POA: Diagnosis present

## 2024-07-11 DIAGNOSIS — R09A2 Foreign body sensation, throat: Secondary | ICD-10-CM | POA: Diagnosis not present

## 2024-07-11 DIAGNOSIS — E538 Deficiency of other specified B group vitamins: Secondary | ICD-10-CM | POA: Diagnosis present

## 2024-07-11 DIAGNOSIS — G9341 Metabolic encephalopathy: Secondary | ICD-10-CM | POA: Diagnosis not present

## 2024-07-11 DIAGNOSIS — N390 Urinary tract infection, site not specified: Secondary | ICD-10-CM

## 2024-07-11 DIAGNOSIS — R1314 Dysphagia, pharyngoesophageal phase: Secondary | ICD-10-CM | POA: Diagnosis present

## 2024-07-11 DIAGNOSIS — Z751 Person awaiting admission to adequate facility elsewhere: Secondary | ICD-10-CM

## 2024-07-11 DIAGNOSIS — E43 Unspecified severe protein-calorie malnutrition: Secondary | ICD-10-CM | POA: Diagnosis present

## 2024-07-11 DIAGNOSIS — B952 Enterococcus as the cause of diseases classified elsewhere: Secondary | ICD-10-CM

## 2024-07-11 DIAGNOSIS — Z79899 Other long term (current) drug therapy: Secondary | ICD-10-CM

## 2024-07-11 DIAGNOSIS — R5381 Other malaise: Secondary | ICD-10-CM | POA: Diagnosis present

## 2024-07-11 DIAGNOSIS — A4181 Sepsis due to Enterococcus: Principal | ICD-10-CM | POA: Diagnosis present

## 2024-07-11 DIAGNOSIS — F17210 Nicotine dependence, cigarettes, uncomplicated: Secondary | ICD-10-CM | POA: Diagnosis present

## 2024-07-11 DIAGNOSIS — M4856XA Collapsed vertebra, not elsewhere classified, lumbar region, initial encounter for fracture: Secondary | ICD-10-CM | POA: Diagnosis present

## 2024-07-11 DIAGNOSIS — G629 Polyneuropathy, unspecified: Secondary | ICD-10-CM | POA: Diagnosis present

## 2024-07-11 DIAGNOSIS — N3001 Acute cystitis with hematuria: Secondary | ICD-10-CM | POA: Diagnosis present

## 2024-07-11 DIAGNOSIS — R4189 Other symptoms and signs involving cognitive functions and awareness: Secondary | ICD-10-CM | POA: Diagnosis present

## 2024-07-11 DIAGNOSIS — D539 Nutritional anemia, unspecified: Secondary | ICD-10-CM | POA: Diagnosis present

## 2024-07-11 DIAGNOSIS — K224 Dyskinesia of esophagus: Secondary | ICD-10-CM | POA: Diagnosis present

## 2024-07-11 DIAGNOSIS — F10239 Alcohol dependence with withdrawal, unspecified: Secondary | ICD-10-CM | POA: Diagnosis present

## 2024-07-11 DIAGNOSIS — E531 Pyridoxine deficiency: Secondary | ICD-10-CM | POA: Diagnosis present

## 2024-07-11 DIAGNOSIS — Z7982 Long term (current) use of aspirin: Secondary | ICD-10-CM

## 2024-07-11 DIAGNOSIS — I69351 Hemiplegia and hemiparesis following cerebral infarction affecting right dominant side: Secondary | ICD-10-CM

## 2024-07-11 DIAGNOSIS — R531 Weakness: Principal | ICD-10-CM

## 2024-07-11 DIAGNOSIS — R7881 Bacteremia: Secondary | ICD-10-CM

## 2024-07-11 DIAGNOSIS — E872 Acidosis, unspecified: Secondary | ICD-10-CM | POA: Diagnosis present

## 2024-07-11 DIAGNOSIS — K529 Noninfective gastroenteritis and colitis, unspecified: Secondary | ICD-10-CM | POA: Diagnosis present

## 2024-07-11 DIAGNOSIS — F419 Anxiety disorder, unspecified: Secondary | ICD-10-CM | POA: Diagnosis present

## 2024-07-11 DIAGNOSIS — R6521 Severe sepsis with septic shock: Secondary | ICD-10-CM | POA: Diagnosis present

## 2024-07-11 DIAGNOSIS — Z6828 Body mass index (BMI) 28.0-28.9, adult: Secondary | ICD-10-CM

## 2024-07-11 DIAGNOSIS — A419 Sepsis, unspecified organism: Secondary | ICD-10-CM | POA: Diagnosis present

## 2024-07-11 LAB — COMPREHENSIVE METABOLIC PANEL WITH GFR
ALT: 15 U/L (ref 0–44)
AST: 48 U/L — ABNORMAL HIGH (ref 15–41)
Albumin: 2.5 g/dL — ABNORMAL LOW (ref 3.5–5.0)
Alkaline Phosphatase: 140 U/L — ABNORMAL HIGH (ref 38–126)
Anion gap: 14 (ref 5–15)
BUN: 27 mg/dL — ABNORMAL HIGH (ref 6–20)
CO2: 32 mmol/L (ref 22–32)
Calcium: 8.7 mg/dL — ABNORMAL LOW (ref 8.9–10.3)
Chloride: 86 mmol/L — ABNORMAL LOW (ref 98–111)
Creatinine, Ser: 1.02 mg/dL — ABNORMAL HIGH (ref 0.44–1.00)
GFR, Estimated: >60 mL/min
Glucose, Bld: 86 mg/dL (ref 70–99)
Potassium: 3 mmol/L — ABNORMAL LOW (ref 3.5–5.1)
Sodium: 131 mmol/L — ABNORMAL LOW (ref 135–145)
Total Bilirubin: 0.5 mg/dL (ref 0.0–1.2)
Total Protein: 6.5 g/dL (ref 6.5–8.1)

## 2024-07-11 LAB — I-STAT CG4 LACTIC ACID, ED
Lactic Acid, Venous: 2.1 mmol/L (ref 0.5–1.9)
Lactic Acid, Venous: 3.3 mmol/L (ref 0.5–1.9)

## 2024-07-11 LAB — CBC
HCT: 39.6 % (ref 36.0–46.0)
Hemoglobin: 13.7 g/dL (ref 12.0–15.0)
MCH: 32.5 pg (ref 26.0–34.0)
MCHC: 34.6 g/dL (ref 30.0–36.0)
MCV: 93.8 fL (ref 80.0–100.0)
Platelets: 221 K/uL (ref 150–400)
RBC: 4.22 MIL/uL (ref 3.87–5.11)
RDW: 16.2 % — ABNORMAL HIGH (ref 11.5–15.5)
WBC: 5.6 K/uL (ref 4.0–10.5)
nRBC: 0 % (ref 0.0–0.2)

## 2024-07-11 LAB — CBG MONITORING, ED: Glucose-Capillary: 91 mg/dL (ref 70–99)

## 2024-07-11 MED ORDER — PIPERACILLIN-TAZOBACTAM 3.375 G IVPB 30 MIN
3.3750 g | Freq: Once | INTRAVENOUS | Status: AC
  Administered 2024-07-11: 3.375 g via INTRAVENOUS
  Filled 2024-07-11: qty 50

## 2024-07-11 MED ORDER — SODIUM CHLORIDE 0.9 % IV BOLUS
1000.0000 mL | Freq: Once | INTRAVENOUS | Status: AC
  Administered 2024-07-11: 1000 mL via INTRAVENOUS

## 2024-07-11 MED ORDER — LACTATED RINGERS IV BOLUS
1000.0000 mL | Freq: Once | INTRAVENOUS | Status: AC
  Administered 2024-07-12: 1000 mL via INTRAVENOUS

## 2024-07-11 MED ORDER — IOHEXOL 350 MG/ML SOLN
75.0000 mL | Freq: Once | INTRAVENOUS | Status: AC | PRN
  Administered 2024-07-11: 75 mL via INTRAVENOUS

## 2024-07-11 NOTE — ED Triage Notes (Signed)
 Pt BIB [facility] coming from home called out for stroke. Patient has previous stroke with R sided deficits. Patient present with worsening R sided weakness and and minor drooping of R side. Pt also reports dark tarry stools. Diarrhea, polyuria, and muscle spasms in her lower extremities. Pt AO x4 on arrival.  [facility]: 114/86 96HR 126 CBG 100% RA

## 2024-07-11 NOTE — ED Provider Notes (Signed)
 " [facility] DEPT [facility] Emergency Department Provider Note MRN:  [id number]  Arrival date & time: 07/12/2024     Chief Complaint   Weakness   History of Present Illness   Veronica Gaines is a 53 y.o. year-old female presents to the ED with chief complaint of diarrhea for the past several months.  States that the symptoms have worsened to the point that they are debilitating.  She states that she doesn't having any strength.  She states that she can't be mobile or do anything due to the persistent diarrhea.  She states that her backside is raw from the diarrhea.  States that she has had some dysuria as well.  Reports some new vomiting as well..   She also has hx of stroke with right sided deficits and states that her symptoms have worsened and she feels like she is having symptoms on the left side now too.  States that these new symptoms started 2-3 weeks ago.  History provided by patient.   Review of Systems  Pertinent positive and negative review of systems noted in HPI.    Physical Exam   Vitals:   07/12/24 0515 07/12/24 0517  BP: (!) 90/55   Pulse: 91   Resp: 12   Temp:  (!) 97.5 F (36.4 C)  SpO2: 92%     CONSTITUTIONAL:  non toxic-appearing, NAD NEURO:  Alert and oriented x 3, CN 3-12 grossly intact EYES:  eyes equal and reactive ENT/NECK:  Supple, no stridor  CARDIO:  tachycardic, regular rhythm, appears well-perfused  PULM:  No respiratory distress, CTAB GI/GU:  non-distended, generalized lower abdominal cramping MSK/SPINE:  No gross deformities, no edema, moves all extremities  SKIN:  no rash, atraumatic   *Additional and/or pertinent findings included in MDM below  Diagnostic and Interventional Summary    EKG Interpretation Date/Time:  Saturday July 11 2024 20:56:46 EST Ventricular Rate:  94 PR Interval:  128 QRS Duration:  70 QT Interval:  346 QTC Calculation: 432 R Axis:   80  Text Interpretation: Normal sinus rhythm Right atrial  enlargement Nonspecific ST changes When compared with ECG of 12-Apr-2024 21:43, PREVIOUS ECG IS PRESENT No STEMI Confirmed by Darra Chew [phone]) on 07/11/2024 11:46:57 PM       Labs Reviewed  COMPREHENSIVE METABOLIC PANEL WITH GFR - Abnormal; Notable for the following components:      Result Value   Sodium 131 (*)    Potassium 3.0 (*)    Chloride 86 (*)    BUN 27 (*)    Creatinine, Ser 1.02 (*)    Calcium 8.7 (*)    Albumin 2.5 (*)    AST 48 (*)    Alkaline Phosphatase 140 (*)    All other components within normal limits  CBC - Abnormal; Notable for the following components:   RDW 16.2 (*)    All other components within normal limits  URINALYSIS, ROUTINE W REFLEX MICROSCOPIC - Abnormal; Notable for the following components:   Color, Urine AMBER (*)    APPearance TURBID (*)    Specific Gravity, Urine 1.048 (*)    Hgb urine dipstick SMALL (*)    Protein, ur 100 (*)    Leukocytes,Ua LARGE (*)    Bacteria, UA MANY (*)    All other components within normal limits  I-STAT CG4 LACTIC ACID, ED - Abnormal; Notable for the following components:   Lactic Acid, Venous 3.3 (*)    All other components within normal limits  I-STAT CG4  LACTIC ACID, ED - Abnormal; Notable for the following components:   Lactic Acid, Venous 2.1 (*)    All other components within normal limits  POC OCCULT BLOOD, ED - Normal  CULTURE, BLOOD (ROUTINE X 2)  CULTURE, BLOOD (ROUTINE X 2)  CBG MONITORING, ED    CT HEAD WO CONTRAST ( )  Final Result    CT ABDOMEN PELVIS W CONTRAST  Final Result      Medications  lactated ringers  infusion ( Intravenous New Bag/Given 07/12/24 0437)  sodium chloride  0.9 % bolus 1,000 mL (0 mLs Intravenous Stopped 07/12/24 0015)  piperacillin -tazobactam (ZOSYN ) IVPB 3.375 g (0 g Intravenous Stopped 07/11/24 2245)  lactated ringers  bolus 1,000 mL (0 mLs Intravenous Stopped 07/12/24 0158)  iohexol  (OMNIPAQUE ) 350 MG/ML injection 75 mL (75 mLs Intravenous Contrast Given  07/11/24 2306)  liver oil-zinc  oxide (DESITIN) 40 % ointment ( Topical Given 07/12/24 0441)  ondansetron  (ZOFRAN ) injection 4 mg (4 mg Intravenous Given 07/12/24 0111)  lactated ringers  bolus 1,000 mL (0 mLs Intravenous Stopped 07/12/24 0434)  morphine  (PF) 4 MG/ML injection 4 mg (4 mg Intravenous Given 07/12/24 0418)     Procedures  /  Critical Care Procedures  ED Course and Medical Decision Making  I have reviewed the triage vital signs, the nursing notes, and pertinent available records from the EMR.  Social Determinants Affecting Complexity of Care: Patient has no clinically significant social determinants affecting this chief complaint..   ED Course: Clinical Course as of 07/12/24 0549  Sun Jul 12, 2024  0547 Lactic Acid, Venous(!!): 2.1 Improving with fluids.  Down from 3.3.  Might be due to dehydation, but infection is also considered. [RB]  [id number] CT ABDOMEN PELVIS W CONTRAST Shows proctitis.  Will admit for monitoring, trending lactic, giving fluids.  [RB]    Clinical Course User Index [RB] Vicky Charleston, PA-C    Medical Decision Making Patient here with ongoing diarrhea for the past 7-8 weeks.  Denies any blood in her stool.  Thinks that having all of the diarrhea is sucking her energy.    She also states that she has  had some dysuria.  Will check urine to look for UTI.  UA worrisome for UTI.  Dr. Patt already treated with zosyn .  Amount and/or Complexity of Data Reviewed Labs: ordered. Radiology: ordered.  Risk OTC drugs. Prescription drug management. Decision regarding hospitalization.         Consultants: I consulted with Hospitalist, Dr. Alfornia, who is appreciated for admitting.   Treatment and Plan: Patient's exam and diagnostic results are concerning for diarrhea.  Feel that patient will need admission to the hospital for further treatment and evaluation.    Final Clinical Impressions(s) / ED Diagnoses     ICD-10-CM   1. Weakness  R53.1      2. Urinary tract infection without hematuria, site unspecified  N39.0     3. Proctitis  K62.89       ED Discharge Orders     None         Discharge Instructions Discussed with and Provided to Patient:   Discharge Instructions   None      Vicky Charleston, PA-C 07/12/24 9450    Bari Roxie HERO, DO 07/12/24 1509  "

## 2024-07-12 DIAGNOSIS — R197 Diarrhea, unspecified: Secondary | ICD-10-CM | POA: Diagnosis not present

## 2024-07-12 DIAGNOSIS — I339 Acute and subacute endocarditis, unspecified: Secondary | ICD-10-CM | POA: Diagnosis not present

## 2024-07-12 DIAGNOSIS — A4181 Sepsis due to Enterococcus: Secondary | ICD-10-CM | POA: Diagnosis present

## 2024-07-12 DIAGNOSIS — K5909 Other constipation: Secondary | ICD-10-CM

## 2024-07-12 DIAGNOSIS — A419 Sepsis, unspecified organism: Secondary | ICD-10-CM | POA: Diagnosis present

## 2024-07-12 DIAGNOSIS — Z862 Personal history of diseases of the blood and blood-forming organs and certain disorders involving the immune mechanism: Secondary | ICD-10-CM | POA: Diagnosis not present

## 2024-07-12 DIAGNOSIS — R6521 Severe sepsis with septic shock: Secondary | ICD-10-CM | POA: Diagnosis present

## 2024-07-12 DIAGNOSIS — R7881 Bacteremia: Secondary | ICD-10-CM | POA: Diagnosis not present

## 2024-07-12 DIAGNOSIS — N3000 Acute cystitis without hematuria: Secondary | ICD-10-CM | POA: Diagnosis not present

## 2024-07-12 DIAGNOSIS — F1010 Alcohol abuse, uncomplicated: Secondary | ICD-10-CM | POA: Diagnosis not present

## 2024-07-12 DIAGNOSIS — R4189 Other symptoms and signs involving cognitive functions and awareness: Secondary | ICD-10-CM | POA: Diagnosis present

## 2024-07-12 DIAGNOSIS — E876 Hypokalemia: Secondary | ICD-10-CM | POA: Diagnosis present

## 2024-07-12 DIAGNOSIS — F1090 Alcohol use, unspecified, uncomplicated: Secondary | ICD-10-CM | POA: Diagnosis not present

## 2024-07-12 DIAGNOSIS — F419 Anxiety disorder, unspecified: Secondary | ICD-10-CM | POA: Diagnosis present

## 2024-07-12 DIAGNOSIS — R531 Weakness: Secondary | ICD-10-CM | POA: Diagnosis present

## 2024-07-12 DIAGNOSIS — Z6828 Body mass index (BMI) 28.0-28.9, adult: Secondary | ICD-10-CM | POA: Diagnosis not present

## 2024-07-12 DIAGNOSIS — E538 Deficiency of other specified B group vitamins: Secondary | ICD-10-CM | POA: Diagnosis present

## 2024-07-12 DIAGNOSIS — K6289 Other specified diseases of anus and rectum: Secondary | ICD-10-CM

## 2024-07-12 DIAGNOSIS — E531 Pyridoxine deficiency: Secondary | ICD-10-CM | POA: Diagnosis present

## 2024-07-12 DIAGNOSIS — R09A2 Foreign body sensation, throat: Secondary | ICD-10-CM | POA: Diagnosis not present

## 2024-07-12 DIAGNOSIS — E872 Acidosis, unspecified: Secondary | ICD-10-CM | POA: Diagnosis present

## 2024-07-12 DIAGNOSIS — R1314 Dysphagia, pharyngoesophageal phase: Secondary | ICD-10-CM | POA: Diagnosis present

## 2024-07-12 DIAGNOSIS — F17210 Nicotine dependence, cigarettes, uncomplicated: Secondary | ICD-10-CM

## 2024-07-12 DIAGNOSIS — M4856XA Collapsed vertebra, not elsewhere classified, lumbar region, initial encounter for fracture: Secondary | ICD-10-CM | POA: Diagnosis present

## 2024-07-12 DIAGNOSIS — F1011 Alcohol abuse, in remission: Secondary | ICD-10-CM

## 2024-07-12 DIAGNOSIS — B952 Enterococcus as the cause of diseases classified elsewhere: Secondary | ICD-10-CM | POA: Diagnosis not present

## 2024-07-12 DIAGNOSIS — E43 Unspecified severe protein-calorie malnutrition: Secondary | ICD-10-CM | POA: Diagnosis present

## 2024-07-12 DIAGNOSIS — I69351 Hemiplegia and hemiparesis following cerebral infarction affecting right dominant side: Secondary | ICD-10-CM | POA: Diagnosis not present

## 2024-07-12 DIAGNOSIS — F10239 Alcohol dependence with withdrawal, unspecified: Secondary | ICD-10-CM | POA: Diagnosis present

## 2024-07-12 DIAGNOSIS — I38 Endocarditis, valve unspecified: Secondary | ICD-10-CM | POA: Diagnosis not present

## 2024-07-12 DIAGNOSIS — K224 Dyskinesia of esophagus: Secondary | ICD-10-CM | POA: Diagnosis present

## 2024-07-12 DIAGNOSIS — N3001 Acute cystitis with hematuria: Secondary | ICD-10-CM | POA: Diagnosis present

## 2024-07-12 DIAGNOSIS — R5381 Other malaise: Secondary | ICD-10-CM | POA: Diagnosis not present

## 2024-07-12 DIAGNOSIS — G9341 Metabolic encephalopathy: Secondary | ICD-10-CM | POA: Diagnosis not present

## 2024-07-12 DIAGNOSIS — K529 Noninfective gastroenteritis and colitis, unspecified: Secondary | ICD-10-CM | POA: Diagnosis not present

## 2024-07-12 DIAGNOSIS — G629 Polyneuropathy, unspecified: Secondary | ICD-10-CM | POA: Diagnosis present

## 2024-07-12 DIAGNOSIS — D539 Nutritional anemia, unspecified: Secondary | ICD-10-CM | POA: Diagnosis present

## 2024-07-12 DIAGNOSIS — Z7982 Long term (current) use of aspirin: Secondary | ICD-10-CM | POA: Diagnosis not present

## 2024-07-12 LAB — CBC
HCT: 31.4 % — ABNORMAL LOW (ref 36.0–46.0)
Hemoglobin: 11 g/dL — ABNORMAL LOW (ref 12.0–15.0)
MCH: 32.6 pg (ref 26.0–34.0)
MCHC: 35 g/dL (ref 30.0–36.0)
MCV: 93.2 fL (ref 80.0–100.0)
Platelets: 194 K/uL (ref 150–400)
RBC: 3.37 MIL/uL — ABNORMAL LOW (ref 3.87–5.11)
RDW: 16.4 % — ABNORMAL HIGH (ref 11.5–15.5)
WBC: 4.4 K/uL (ref 4.0–10.5)
nRBC: 0 % (ref 0.0–0.2)

## 2024-07-12 LAB — BLOOD CULTURE ID PANEL (REFLEXED) - BCID2

## 2024-07-12 LAB — URINALYSIS, ROUTINE W REFLEX MICROSCOPIC
Bilirubin Urine: NEGATIVE
Glucose, UA: NEGATIVE mg/dL
Ketones, ur: NEGATIVE mg/dL
Nitrite: NEGATIVE
Protein, ur: 100 mg/dL — AB
Specific Gravity, Urine: 1.048 — ABNORMAL HIGH (ref 1.005–1.030)
WBC, UA: >50 WBC/hpf (ref 0–5)
pH: 5 (ref 5.0–8.0)

## 2024-07-12 LAB — CREATININE, SERUM
Creatinine, Ser: 0.83 mg/dL (ref 0.44–1.00)
GFR, Estimated: >60 mL/min

## 2024-07-12 LAB — GLUCOSE, CAPILLARY
Glucose-Capillary: 80 mg/dL (ref 70–99)
Glucose-Capillary: 72 mg/dL (ref 70–99)
Glucose-Capillary: 80 mg/dL (ref 70–99)

## 2024-07-12 LAB — POC OCCULT BLOOD, ED: Fecal Occult Blood, POC: NEGATIVE

## 2024-07-12 MED ORDER — THIAMINE MONONITRATE 100 MG PO TABS
100.0000 mg | ORAL_TABLET | Freq: Every day | ORAL | Status: DC
  Administered 2024-07-15 – 2024-07-24 (×10): 100 mg via ORAL
  Filled 2024-07-12 (×6): qty 1

## 2024-07-12 MED ORDER — ASPIRIN 81 MG PO TBEC
81.0000 mg | DELAYED_RELEASE_TABLET | Freq: Every day | ORAL | Status: DC
  Administered 2024-07-13 – 2024-07-24 (×11): 81 mg via ORAL
  Filled 2024-07-12 (×6): qty 1

## 2024-07-12 MED ORDER — SODIUM CHLORIDE 0.9 % IV SOLN: 250.0000 mL | INTRAVENOUS | Status: AC

## 2024-07-12 MED ORDER — MORPHINE SULFATE (PF) 2 MG/ML IV SOLN
2.0000 mg | INTRAVENOUS | Status: DC | PRN
  Administered 2024-07-12 – 2024-07-13 (×2): 2 mg via INTRAVENOUS
  Filled 2024-07-12: qty 1

## 2024-07-12 MED ORDER — MAGNESIUM OXIDE -MG SUPPLEMENT 400 (240 MG) MG PO TABS
400.0000 mg | ORAL_TABLET | Freq: Every day | ORAL | Status: DC
  Filled 2024-07-12: qty 1

## 2024-07-12 MED ORDER — ZINC OXIDE 40 % EX OINT
TOPICAL_OINTMENT | Freq: Once | CUTANEOUS | Status: AC
  Filled 2024-07-12: qty 57

## 2024-07-12 MED ORDER — LACTATED RINGERS IV SOLN: INTRAVENOUS | Status: AC

## 2024-07-12 MED ORDER — VITAMIN B-6 25 MG PO TABS
50.0000 mg | ORAL_TABLET | Freq: Every day | ORAL | Status: DC
  Administered 2024-07-15 – 2024-07-24 (×10): 50 mg via ORAL
  Filled 2024-07-12 (×6): qty 2

## 2024-07-12 MED ORDER — NOREPINEPHRINE 4 MG/250ML-% IV SOLN
0.0000 ug/min | INTRAVENOUS | Status: DC
  Administered 2024-07-12: 2 ug/min via INTRAVENOUS
  Filled 2024-07-12: qty 250

## 2024-07-12 MED ORDER — VANCOMYCIN HCL IN DEXTROSE 1-5 GM/200ML-% IV SOLN
1000.0000 mg | Freq: Two times a day (BID) | INTRAVENOUS | Status: DC
  Administered 2024-07-12: 1000 mg via INTRAVENOUS
  Filled 2024-07-12: qty 200

## 2024-07-12 MED ORDER — FOLIC ACID 1 MG PO TABS
1.0000 mg | ORAL_TABLET | Freq: Every day | ORAL | Status: DC
  Administered 2024-07-15 – 2024-07-24 (×10): 1 mg via ORAL
  Filled 2024-07-12 (×6): qty 1

## 2024-07-12 MED ORDER — ENOXAPARIN SODIUM 40 MG/0.4ML IJ SOSY
40.0000 mg | PREFILLED_SYRINGE | Freq: Every day | INTRAMUSCULAR | Status: DC
  Administered 2024-07-12 – 2024-07-24 (×12): 40 mg via SUBCUTANEOUS
  Filled 2024-07-12 (×6): qty 0.4

## 2024-07-12 MED ORDER — PIPERACILLIN-TAZOBACTAM 3.375 G IVPB
3.3750 g | Freq: Three times a day (TID) | INTRAVENOUS | Status: DC
  Administered 2024-07-12 – 2024-07-13 (×3): 3.375 g via INTRAVENOUS
  Filled 2024-07-12 (×2): qty 50

## 2024-07-12 MED ORDER — VITAMIN B-12 1000 MCG PO TABS
1000.0000 ug | ORAL_TABLET | Freq: Every day | ORAL | Status: DC
  Administered 2024-07-12: 1000 ug via ORAL
  Filled 2024-07-12: qty 1

## 2024-07-12 MED ORDER — ONDANSETRON HCL 4 MG/2ML IJ SOLN
4.0000 mg | Freq: Once | INTRAMUSCULAR | Status: AC
  Administered 2024-07-12: 4 mg via INTRAVENOUS
  Filled 2024-07-12: qty 2

## 2024-07-12 MED ORDER — SODIUM CHLORIDE 0.9 % IV BOLUS: 1000.0000 mL | Freq: Once | INTRAVENOUS | Status: DC

## 2024-07-12 MED ORDER — LACTATED RINGERS IV BOLUS
1000.0000 mL | Freq: Once | INTRAVENOUS | Status: AC
  Administered 2024-07-12: 1000 mL via INTRAVENOUS

## 2024-07-12 MED ORDER — MORPHINE SULFATE (PF) 4 MG/ML IV SOLN
4.0000 mg | Freq: Once | INTRAVENOUS | Status: AC
  Administered 2024-07-12: 4 mg via INTRAVENOUS
  Filled 2024-07-12: qty 1

## 2024-07-12 NOTE — ED Notes (Addendum)
 Attempted I&O cath, unsuccessful.

## 2024-07-12 NOTE — Evaluation (Signed)
 Clinical/Bedside Swallow Evaluation Patient Details  Name: Veronica Gaines MRN: [id number] Date of Birth: [birth year]  Today's Date: 07/12/2024 Time: SLP Start Time (ACUTE ONLY): 1327 SLP Stop Time (ACUTE ONLY): 1359 SLP Time Calculation (min) (ACUTE ONLY): 32 min  Past Medical History:  Past Medical History:  Diagnosis Date   Alcohol abuse    Anemia    Polyneuropathy    Vertigo    Past Surgical History:  Past Surgical History:  Procedure Laterality Date   CESAREAN SECTION     CHOLECYSTECTOMY     HPI:  53 yo female admitted from home 07/11/24 with worsening right weakness. CTHead: No acute intracranial abnormality. No prior ST intervention. 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 Pt has adequate natural dentition, but she reports sores in her mouth and an infected tooth that needs to be pulled. Slight swelling noted at the level of the right cheek, possibly due to dental issues. CN exam is unremarkable. Pt accepted trials of thin liquid, puree, and solid textures. Extended oral prep noted on solids, with mild oral residue observed. No overt s/s aspiration across presentations. Pt indicates history of difficulty swallowing pills, and states taking meds with puree or chewing up meds is helpful.   Pt reports globus sensation and indicates she regurgitates if she eats too quickly or eats too much at a sitting. Recommend consideration of a regular barium swallow (esophagram) to evaluate esophageal motility. ST will follow up for education once results are available. RN/MD informed.  SLP Visit Diagnosis: Dysphagia, unspecified (R13.10)    Aspiration Risk  Mild aspiration risk    Diet Recommendation Regular;Thin liquid    Liquid  Administration via: Cup;Straw Medication Administration: Whole meds with puree Supervision: Patient able to self feed Compensations: Minimize environmental distractions;Small sips/bites;Slow rate Postural Changes: Seated upright at 90 degrees;Remain upright for at least 30 minutes after po intake    Other Recommendations Recommended Consults: Consider GI evaluation Oral Care Recommendations: Oral care BID     Swallow Evaluation Recommendations  Recommend consideration of regular barium swallow (esophagram)   Assistance Recommended at Discharge  TBD  Functional Status Assessment Patient has had a recent decline in their functional status and demonstrates the ability to make significant improvements in function in a reasonable and predictable amount of time.  Frequency and Duration min 1 x/week  2 weeks       Prognosis Prognosis for improved oropharyngeal function: Good      Swallow Study   General Date of Onset: 07/11/24 HPI: 53 yo female admitted from home 07/11/24 with worsening right weakness. CTHead: No acute intracranial abnormality. No prior ST intervention. 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, alcohol abuse, recurrent falls Type of Study: Bedside Swallow Evaluation Previous Swallow Assessment: none Diet Prior to this Study: Regular;Thin liquids (Level 0) Temperature Spikes Noted: No Respiratory Status: Room air History of Recent Intubation: No Behavior/Cognition: Alert;Cooperative;Pleasant mood Oral Cavity Assessment: Other (comment) (issues with dentition and sore areas in the oral cavity) Oral Care Completed by SLP: No (pt eating lunch upon arrival of SLP) Oral Cavity - Dentition: Adequate natural dentition Vision: Functional for self-feeding Self-Feeding Abilities: Able to feed self;Needs set up Patient Positioning: Upright in bed Baseline Vocal Quality: Normal Volitional Cough: Weak Volitional Swallow: Able to  elicit    Oral/Motor/Sensory  Function Overall Oral Motor/Sensory Function: Other (comment) (pt appears to have swelling of the right cheek, likely due to dental issues. Normal strength and ROM.) Mandible: Other (Comment) (reduced opening observed)   Ice Chips Ice chips: Not tested   Thin Liquid Thin Liquid: Within functional limits Presentation: Cup;Self Fed    Nectar Thick Nectar Thick Liquid: Not tested   Honey Thick Honey Thick Liquid: Not tested   Puree Puree: Within functional limits Presentation: Self Fed   Solid     Solid: Within functional limits Presentation: Self Fed     Jayren Cease B. Dory, MSP, CCC-SLP Speech Language Pathologist Office: [phone]  Dory Caprice Daring 07/12/2024,2:15 PM

## 2024-07-12 NOTE — ED Notes (Signed)
Transferred patient to hospital bed

## 2024-07-12 NOTE — ED Notes (Signed)
 Pt bp soft at 87/51 MD Long advised new orders placed.

## 2024-07-12 NOTE — Consult Note (Incomplete Revision)
 "  NAME:  Veronica Gaines, MRN:  [id number], DOB:  1971-04-08, LOS: 0 ADMISSION DATE:  07/11/2024, CONSULTATION DATE:  07/12/2024  REFERRING MD:  TRH, CHIEF COMPLAINT:  suprapubic pain   History of Present Illness:  53 year old female patient with multiple comorbidities as listed below presented to the emergency room with frequent unexplained bowel movements/persistent diarrhea.  On arrival was hypotensive, tachycardic, and tachypneic.  Lactate initially 3.3, improved to 2.1 with fluids.  CT head was negative, CT abdomen pelvis showed proctocolitis in L1 compression fracture. Further laboratory evaluation identified following: Normal white blood cell count Initial lactate 3.3, cleared to 2.1, Had 1 out of 2 blood cultures positive for GPC in the aerobic bottle only UA urine was turbid protein positive large amount of leukocytes many bacteria. Cultures were sent, received a total of 3 L of crystalloid, broad-spectrum antibiotics were started, and patient remained tensive so norepinephrine  was started by the medical service just before 1 PM on 12/28.  As of 4 PM was still on norepinephrine  so critical care was asked to evaluate.   Pertinent  Medical History  Alcohol abuse, anemia, polyneuropathy, vertigo, prior stroke with residual right-sided weakness  Significant Hospital Events: Including procedures, antibiotic start and stop dates in addition to other pertinent events     Interim History / Subjective:  N/a  Objective    Blood pressure (!) 106/50, pulse 98, temperature 98.5 F (36.9 C), temperature source Oral, resp. rate (!) 21, weight 72.6 kg, SpO2 100%.       No intake or output data in the 24 hours ending 07/12/24 1724 Filed Weights   07/12/24 0830  Weight: 72.6 kg    Examination: General: lying in bed, tearful HENT: AT/[place] Lungs: NWOB, on RA Cardiovascular: RRR Abdomen: ND Neuro: alert oriented no deficits   Resolved problem list   Assessment and Plan  Severe  sepsis, septic shock with lactic acidosis secondary to acute cystitis plus minus proctitis. Received over 3 L of crystalloid and remained hypotensive, now on norepinephrine  and maintaining mean arterial pressure Plan Admit to the intensive care Continue IV hydration Continue norepinephrine  for mean arterial pressure greater than 65, actively weaning and off currently Continue Zosyn  for now given proctitis in addition to the cystitis Narrow as cultures result Hold any antihypertensives Follow-up lactate  History of chronic anemia Plan Follow-up CBC Transfusion trigger less than 7  Chronic diarrhea Now with proctitis Infectious? UC? Plan Sending stool evaluation GI c/s in AM  Esophageal dysphagia with regurgitation Plan Barium swallow recommended.  Would also benefit from esophagram.  Further recommendations pending these results  H/o ETOH abuse Plan CIWA   Labs   CBC: Recent Labs  Lab 07/11/24 2112 07/12/24 0815  WBC 5.6 4.4  HGB 13.7 11.0*  HCT 39.6 31.4*  MCV 93.8 93.2  PLT 221 194    Basic Metabolic Panel: Recent Labs  Lab 07/11/24 2112 07/12/24 0815  NA 131*  --   K 3.0*  --   CL 86*  --   CO2 32  --   GLUCOSE 86  --   BUN 27*  --   CREATININE 1.02* 0.83  CALCIUM 8.7*  --    GFR: Estimated Creatinine Clearance: 75.4 mL/min (by C-G formula based on SCr of 0.83 mg/dL). Recent Labs  Lab 07/11/24 2112 07/11/24 2120 07/11/24 2334 07/12/24 0815  WBC 5.6  --   --  4.4  LATICACIDVEN  --  3.3* 2.1*  --     Liver Function Tests: Recent  Labs  Lab 07/11/24 2112  AST 48*  ALT 15  ALKPHOS 140*  BILITOT 0.5  PROT 6.5  ALBUMIN 2.5*   No results for input(s): LIPASE, AMYLASE in the last 168 hours. No results for input(s): AMMONIA in the last 168 hours.  ABG No results found for: PHART, PCO2ART, PO2ART, HCO3, TCO2, ACIDBASEDEF, O2SAT   Coagulation Profile: No results for input(s): INR, PROTIME in the last 168  hours.  Cardiac Enzymes: No results for input(s): CKTOTAL, CKMB, CKMBINDEX, TROPONINI in the last 168 hours.  HbA1C: No results found for: HGBA1C  CBG: Recent Labs  Lab 07/11/24 2106  GLUCAP 91    Review of Systems:   Negative unless discussed in HPI above  Past Medical History:  She,  has a past medical history of Alcohol abuse, Anemia, Polyneuropathy, and Vertigo.   Surgical History:   Past Surgical History:  Procedure Laterality Date   CESAREAN SECTION     CHOLECYSTECTOMY       Social History:   reports that she has been smoking cigarettes. She has never used smokeless tobacco. She reports current alcohol use. She reports that she does not use drugs.   Family History:  Her family history is not on file.   Allergies Allergies[1]   Home Medications  Prior to Admission medications  Medication Sig Start Date End Date Taking? Authorizing Provider  amoxicillin (AMOXIL) 500 MG capsule Take 500 mg by mouth 3 (three) times daily. 07/02/24  Yes [provider]  aspirin  EC 81 MG tablet Take 81 mg by mouth daily. Swallow whole.   Yes [provider]  chlorhexidine  (PERIDEX ) 0.12 % solution Use as directed 15 mLs in the mouth or throat 2 (two) times daily. 07/03/24  Yes [provider]  cyanocobalamin  (VITAMIN B12) 1000 MCG tablet Take 1 tablet (1,000 mcg total) by mouth daily. 04/15/24 07/14/24 Yes Pahwani, Fredia, MD  folic acid  (FOLVITE ) 1 MG tablet Take 1 tablet (1 mg total) by mouth daily. 04/16/24 07/15/24 Yes Pahwani, Ravi, MD  ibuprofen (ADVIL) 600 MG tablet Take 600 mg by mouth every 8 (eight) hours as needed for moderate pain (pain score 4-6). 07/02/24  Yes [provider]  pyridOXINE  (VITAMIN B6) 50 MG tablet Take 1 tablet (50 mg total) by mouth daily. 04/17/24  Yes Khaliqdina, Salman, MD  thiamine  (VITAMIN B1) 100 MG tablet Take 1 tablet (100 mg total) by mouth daily. 04/15/24 07/14/24 Yes Vernon Fredia, MD     Critical care  time:     CRITICAL CARE Performed by: Donnice JONELLE Beals   Total critical care time: 35 minutes  Critical care time was exclusive of separately billable procedures and treating other patients.  Critical care was necessary to treat or prevent imminent or life-threatening deterioration.  Critical care was time spent personally by me on the following activities: development of treatment plan with patient and/or surrogate as well as nursing, discussions with consultants, evaluation of patient's response to treatment, examination of patient, obtaining history from patient or surrogate, ordering and performing treatments and interventions, ordering and review of laboratory studies, ordering and review of radiographic studies, pulse oximetry and re-evaluation of patient's condition.   Donnice JONELLE Beals, MD     [1] No Known Allergies  "

## 2024-07-12 NOTE — Progress Notes (Signed)
 PHARMACY - PHYSICIAN COMMUNICATION CRITICAL VALUE ALERT - BLOOD CULTURE IDENTIFICATION (BCID)  [name] is an 53 y.o. female who presented to [facility] on 07/11/2024 with a chief complaint of Sepsis  Assessment:  1 of 4 BC E Faecalis no resistance mechanisms detected. Possible contaminant, but given presentation concern for proper enterococcus coverage.  Name of physician (or Provider) Contacted: [place]  Current antibiotics: Zosyn   Changes to prescribed antibiotics recommended:  Recommendations accepted by provider. Repeat blood cultures and added vancomycin .  Results for orders placed or performed during the hospital encounter of 07/11/24  Blood Culture ID Panel (Reflexed) (Collected: 07/11/2024  9:29 PM)  Result Value Ref Range   Enterococcus faecalis DETECTED (A) NOT DETECTED   Enterococcus Faecium NOT DETECTED NOT DETECTED   Listeria monocytogenes NOT DETECTED NOT DETECTED   Staphylococcus species NOT DETECTED NOT DETECTED   Staphylococcus aureus (BCID) NOT DETECTED NOT DETECTED   Staphylococcus epidermidis NOT DETECTED NOT DETECTED   Staphylococcus lugdunensis NOT DETECTED NOT DETECTED   Streptococcus species NOT DETECTED NOT DETECTED   Streptococcus agalactiae NOT DETECTED NOT DETECTED   Streptococcus pneumoniae NOT DETECTED NOT DETECTED   Streptococcus pyogenes NOT DETECTED NOT DETECTED   A.calcoaceticus-baumannii NOT DETECTED NOT DETECTED   Bacteroides fragilis NOT DETECTED NOT DETECTED   Enterobacterales NOT DETECTED NOT DETECTED   Enterobacter cloacae complex NOT DETECTED NOT DETECTED   Escherichia coli NOT DETECTED NOT DETECTED   Klebsiella aerogenes NOT DETECTED NOT DETECTED   Klebsiella oxytoca NOT DETECTED NOT DETECTED   Klebsiella pneumoniae NOT DETECTED NOT DETECTED   Proteus species NOT DETECTED NOT DETECTED   Salmonella species NOT DETECTED NOT DETECTED   Serratia marcescens NOT DETECTED NOT DETECTED   Haemophilus influenzae NOT DETECTED NOT  DETECTED   Neisseria meningitidis NOT DETECTED NOT DETECTED   Pseudomonas aeruginosa NOT DETECTED NOT DETECTED   Stenotrophomonas maltophilia NOT DETECTED NOT DETECTED   Candida albicans NOT DETECTED NOT DETECTED   Candida auris NOT DETECTED NOT DETECTED   Candida glabrata NOT DETECTED NOT DETECTED   Candida krusei NOT DETECTED NOT DETECTED   Candida parapsilosis NOT DETECTED NOT DETECTED   Candida tropicalis NOT DETECTED NOT DETECTED   Cryptococcus neoformans/gattii NOT DETECTED NOT DETECTED   Vancomycin  resistance NOT DETECTED NOT DETECTED    Larraine CHRISTELLA Brazier 07/12/2024  7:16 PM

## 2024-07-12 NOTE — ED Notes (Signed)
 Pts bp soft at 88/66 PA Browning advised, new orders placed.

## 2024-07-12 NOTE — Consult Note (Signed)
 "  NAME:  Veronica Gaines, MRN:  [id number], DOB:  [birth year], LOS: 0 ADMISSION DATE:  07/11/2024, CONSULTATION DATE:  07/12/2024  REFERRING MD:  TRH, CHIEF COMPLAINT:  suprapubic pain   History of Present Illness:  53 year old female patient with multiple comorbidities as listed below presented to the emergency room with frequent unexplained bowel movements/persistent diarrhea.  On arrival was hypotensive, tachycardic, and tachypneic.  Lactate initially 3.3, improved to 2.1 with fluids.  CT head was negative, CT abdomen pelvis showed proctocolitis in L1 compression fracture. Further laboratory evaluation identified following: Normal white blood cell count Initial lactate 3.3, cleared to 2.1, Had 1 out of 2 blood cultures positive for GPC in the aerobic bottle only UA urine was turbid protein positive large amount of leukocytes many bacteria. Cultures were sent, received a total of 3 L of crystalloid, broad-spectrum antibiotics were started, and patient remained tensive so norepinephrine  was started by the medical service just before 1 PM on 12/28.  As of 4 PM was still on norepinephrine  so critical care was asked to evaluate.   Pertinent  Medical History  Alcohol abuse, anemia, polyneuropathy, vertigo, prior stroke with residual right-sided weakness  Significant Hospital Events: Including procedures, antibiotic start and stop dates in addition to other pertinent events     Interim History / Subjective:  N/a  Objective    Blood pressure (!) 106/50, pulse 98, temperature 98.5 F (36.9 C), temperature source Oral, resp. rate (!) 21, weight 72.6 kg, SpO2 100%.       No intake or output data in the 24 hours ending 07/12/24 1724 Filed Weights   07/12/24 0830  Weight: 72.6 kg    Examination: General: lying in bed, tearful HENT: AT/[place] Lungs: NWOB, on RA Cardiovascular: RRR Abdomen: ND Neuro: alert oriented no deficits   Resolved problem list   Assessment and Plan  Severe  sepsis, septic shock with lactic acidosis secondary to acute cystitis plus minus proctitis. Received over 3 L of crystalloid and remained hypotensive, now on norepinephrine  and maintaining mean arterial pressure Plan Admit to the intensive care Continue IV hydration Continue norepinephrine  for mean arterial pressure greater than 65, actively weaning and off currently Continue Zosyn  for now given proctitis in addition to the cystitis Narrow as cultures result Hold any antihypertensives Follow-up lactate  History of chronic anemia Plan Follow-up CBC Transfusion trigger less than 7  Chronic diarrhea Now with proctitis Infectious? UC? Plan Sending stool evaluation GI c/s in AM  Esophageal dysphagia with regurgitation Plan Barium swallow recommended.  Would also benefit from esophagram.  Further recommendations pending these results  H/o ETOH abuse Plan CIWA   Labs   CBC: Recent Labs  Lab 07/11/24 2112 07/12/24 0815  WBC 5.6 4.4  HGB 13.7 11.0*  HCT 39.6 31.4*  MCV 93.8 93.2  PLT 221 194    Basic Metabolic Panel: Recent Labs  Lab 07/11/24 2112 07/12/24 0815  NA 131*  --   K 3.0*  --   CL 86*  --   CO2 32  --   GLUCOSE 86  --   BUN 27*  --   CREATININE 1.02* 0.83  CALCIUM 8.7*  --    GFR: Estimated Creatinine Clearance: 75.4 mL/min (by C-G formula based on SCr of 0.83 mg/dL). Recent Labs  Lab 07/11/24 2112 07/11/24 2120 07/11/24 2334 07/12/24 0815  WBC 5.6  --   --  4.4  LATICACIDVEN  --  3.3* 2.1*  --     Liver Function Tests: Recent  Labs  Lab 07/11/24 2112  AST 48*  ALT 15  ALKPHOS 140*  BILITOT 0.5  PROT 6.5  ALBUMIN 2.5*   No results for input(s): LIPASE, AMYLASE in the last 168 hours. No results for input(s): AMMONIA in the last 168 hours.  ABG No results found for: PHART, PCO2ART, PO2ART, HCO3, TCO2, ACIDBASEDEF, O2SAT   Coagulation Profile: No results for input(s): INR, PROTIME in the last 168  hours.  Cardiac Enzymes: No results for input(s): CKTOTAL, CKMB, CKMBINDEX, TROPONINI in the last 168 hours.  HbA1C: No results found for: HGBA1C  CBG: Recent Labs  Lab 07/11/24 2106  GLUCAP 91    Review of Systems:   Negative unless discussed in HPI above  Past Medical History:  She,  has a past medical history of Alcohol abuse, Anemia, Polyneuropathy, and Vertigo.   Surgical History:   Past Surgical History:  Procedure Laterality Date   CESAREAN SECTION     CHOLECYSTECTOMY       Social History:   reports that she has been smoking cigarettes. She has never used smokeless tobacco. She reports current alcohol use. She reports that she does not use drugs.   Family History:  Her family history is not on file.   Allergies Allergies[1]   Home Medications  Prior to Admission medications  Medication Sig Start Date End Date Taking? Authorizing Provider  amoxicillin (AMOXIL) 500 MG capsule Take 500 mg by mouth 3 (three) times daily. 07/02/24  Yes [provider]  aspirin  EC 81 MG tablet Take 81 mg by mouth daily. Swallow whole.   Yes [provider]  chlorhexidine  (PERIDEX ) 0.12 % solution Use as directed 15 mLs in the mouth or throat 2 (two) times daily. 07/03/24  Yes [provider]  cyanocobalamin  (VITAMIN B12) 1000 MCG tablet Take 1 tablet (1,000 mcg total) by mouth daily. 04/15/24 07/14/24 Yes Pahwani, Fredia, MD  folic acid  (FOLVITE ) 1 MG tablet Take 1 tablet (1 mg total) by mouth daily. 04/16/24 07/15/24 Yes Pahwani, Ravi, MD  ibuprofen (ADVIL) 600 MG tablet Take 600 mg by mouth every 8 (eight) hours as needed for moderate pain (pain score 4-6). 07/02/24  Yes [provider]  pyridOXINE  (VITAMIN B6) 50 MG tablet Take 1 tablet (50 mg total) by mouth daily. 04/17/24  Yes Khaliqdina, Salman, MD  thiamine  (VITAMIN B1) 100 MG tablet Take 1 tablet (100 mg total) by mouth daily. 04/15/24 07/14/24 Yes Vernon Fredia, MD     Critical care  time:     CRITICAL CARE Performed by: Donnice JONELLE Beals   Total critical care time: 35 minutes  Critical care time was exclusive of separately billable procedures and treating other patients.  Critical care was necessary to treat or prevent imminent or life-threatening deterioration.  Critical care was time spent personally by me on the following activities: development of treatment plan with patient and/or surrogate as well as nursing, discussions with consultants, evaluation of patient's response to treatment, examination of patient, obtaining history from patient or surrogate, ordering and performing treatments and interventions, ordering and review of laboratory studies, ordering and review of radiographic studies, pulse oximetry and re-evaluation of patient's condition.   Donnice JONELLE Beals, MD     [1] No Known Allergies  "

## 2024-07-12 NOTE — ED Notes (Signed)
 RN and NT changed pt. Pt had a small BM, brown in color. RN and NT repositioned pt in bed. Pt turned on left side and placed pillow under right flank. Towel placed in between pts legs as barrier.

## 2024-07-12 NOTE — ED Notes (Signed)
 Pt has painful redness/tenderness around the peri-area

## 2024-07-12 NOTE — H&P (Addendum)
 " History and Physical    Patient: Veronica Gaines FMW:[id number] DOB: 1970/08/18 DOA: 07/11/2024 DOS: the patient was seen and examined on 07/12/2024 PCP: Patient, No Pcp Per  Patient coming from: Home  Chief Complaint:  Chief Complaint  Patient presents with   Weakness   HPI: Veronica Gaines is a 53 y.o. female with medical history significant of alcohol abuse, anemia, polyneuropathy, vertigo, previous stroke with residual right-sided deficits who p/w hypotension/generalized weakness and found to have sepsis 2/2 acute cystitis c/b proctitis on CT.  The patient is a limited historian. From what I can gather, the pt presented to the ED after having frequent unexplained bowel movements. The patient presented with a history of persistent diarrhea, described as painful and occurring daily from morning until night. The patient reported that some episodes of diarrhea were black in color. The patient reported using a bag of incontinence products weekly due to the severity of diarrhea. The patient also reported not being very mobile at home due to weakness, particularly in the right leg. The patient had concerns about skin changes on the feet, which were addressed as non-ulcerative skin sloughing that required moisturizing.  In the ED, pt hypotensive, tachycardic (on arrival), and intermittently tachypneic w/o hypoxia. Labs notable for K 3.0, lactic acid 3.3>2.1. CTH w/ NAICA. CT abd/pelvis showed proctitis and known chronic L1 compression fracture. EDP started IV Zosyn  and requested medicine admission.   Review of Systems: As mentioned in the history of present illness. All other systems reviewed and are negative. Past Medical History:  Diagnosis Date   Alcohol abuse    Anemia    Polyneuropathy    Vertigo    Past Surgical History:  Procedure Laterality Date   CESAREAN SECTION     CHOLECYSTECTOMY     Social History:  reports that she has been smoking cigarettes. She has never used  smokeless tobacco. She reports current alcohol use. She reports that she does not use drugs.  Allergies[1]  No family history on file.  Prior to Admission medications  Medication Sig Start Date End Date Taking? Authorizing Provider  acetaminophen  (TYLENOL ) 500 MG tablet Take 1 tablet (500 mg total) by mouth every 6 (six) hours as needed. 09/20/22   Charlyn Sora, MD  aspirin  EC 81 MG tablet Take 81 mg by mouth daily. Swallow whole.    [provider]  cyanocobalamin  (VITAMIN B12) 1000 MCG tablet Take 1 tablet (1,000 mcg total) by mouth daily. 04/15/24 07/14/24  Vernon Ranks, MD  diphenhydrAMINE  (BENADRYL ) 25 mg capsule Take 25 mg by mouth at bedtime as needed for sleep.    [provider]  folic acid  (FOLVITE ) 1 MG tablet Take 1 tablet (1 mg total) by mouth daily. 04/16/24 07/15/24  Vernon Ranks, MD  magnesium  oxide (MAG-OX) 400 (240 Mg) MG tablet Take 400 mg by mouth daily.    [provider]  pyridOXINE  (VITAMIN B6) 50 MG tablet Take 1 tablet (50 mg total) by mouth daily. 04/17/24   Khaliqdina, Salman, MD  thiamine  (VITAMIN B1) 100 MG tablet Take 1 tablet (100 mg total) by mouth daily. 04/15/24 07/14/24  Pahwani, Ravi, MD  vitamin B-12 (CYANOCOBALAMIN ) 100 MCG tablet Take 100 mcg by mouth daily.    [provider]    Physical Exam: Vitals:   07/12/24 0700 07/12/24 0808 07/12/24 0808 07/12/24 0809  BP: 92/61 (!) 81/47  (!) 82/55  Pulse: 90 92  91  Resp: 15 12  11   Temp:   98 F (  36.7 C)   TempSrc:   Oral   SpO2: 94% 100%  97%   General: Alert, oriented x3, resting comfortably in no acute distress Respiratory: Lungs clear to auscultation bilaterally with normal respiratory effort; no w/r/r Cardiovascular: Regular rate and rhythm w/o m/r/g Abdomen: Soft, nontender, nondistended. Positive bowel sounds   Data Reviewed:  Lab Results  Component Value Date   WBC 5.6 07/11/2024   HGB 13.7 07/11/2024   HCT 39.6 07/11/2024   MCV 93.8 07/11/2024    PLT 221 07/11/2024   Lab Results  Component Value Date   GLUCOSE 86 07/11/2024   CALCIUM 8.7 (L) 07/11/2024   NA 131 (L) 07/11/2024   K 3.0 (L) 07/11/2024   CO2 32 07/11/2024   CL 86 (L) 07/11/2024   BUN 27 (H) 07/11/2024   CREATININE 1.02 (H) 07/11/2024   Lab Results  Component Value Date   ALT 15 07/11/2024   AST 48 (H) 07/11/2024   ALKPHOS 140 (H) 07/11/2024   BILITOT 0.5 07/11/2024   Lab Results  Component Value Date   INR 1.0 12/08/2019   Radiology: CT ABDOMEN PELVIS W CONTRAST Result Date: 07/12/2024 EXAM: CT ABDOMEN AND PELVIS WITH CONTRAST 07/11/2024 11:04:56 PM TECHNIQUE: CT of the abdomen and pelvis was performed with the administration of 75 mL of iohexol  (OMNIPAQUE ) 350 MG/ML injection. Multiplanar reformatted images are provided for review. Automated exposure control, iterative reconstruction, and/or weight-based adjustment of the mA/kV was utilized to reduce the radiation dose to as low as reasonably achievable. COMPARISON: None available. CLINICAL HISTORY: Abdominal pain, acute, nonlocalized. FINDINGS: LOWER CHEST: The lung bases are clear. There are calcifications in the LAD and right coronary arteries. The cardiac size is normal. LIVER: The liver is mildly steatotic. There is focal periligamentous fat in segment 4b. There is no mass. GALLBLADDER AND BILE DUCTS: The gallbladder is dilated, up to 12 cm in length, but there is no wall thickening or calcified gallstones. No biliary ductal dilatation. SPLEEN: No acute abnormality. PANCREAS: The pancreas is partially atrophic and otherwise unremarkable. ADRENAL GLANDS: No acute abnormality. There is no adrenal mass. KIDNEYS, URETERS AND BLADDER: No stones in the kidneys or ureters. No hydronephrosis. No perinephric or periureteral stranding. There is no renal mass. There is symmetric renal excretion on the delayed images. Urinary bladder is unremarkable. GI AND BOWEL: Stomach demonstrates no acute abnormality. There is no small  bowel obstruction or inflammation. The appendix is normal. There is sigmoid diverticulosis without diverticulitis. There is mild rectal wall thickening and perirectal stranding consistent with proctitis. Direct visualization is recommended after treatment. There is formed stool throughout the colon, mild fecal stasis in the ascending and transverse segments. PERITONEUM AND RETROPERITONEUM: There is trace ascites in the posterior deep pelvis likely reactive. There is no free hemorrhage, free air or localizing collections. No incarcerated hernia. VASCULATURE: Aorta is normal in caliber. LYMPH NODES: No lymphadenopathy. REPRODUCTIVE ORGANS: No acute abnormality. BONES AND SOFT TISSUES: There is a chronic bowtie-shaped compression fracture of the L1 vertebral body, attempted anterior bridging osteophytes to the adjacent segments, and no retropulsion. No acute or other significant osseous findings. Mild lower Lumbar facet hypertrophy. No focal soft tissue abnormality. IMPRESSION: 1. Mild rectal wall thickening and perirectal stranding consistent with proctitis. Direct visualization is recommended after treatment. 2. Dilated gallbladder up to 12 cm in length without wall thickening or calcified gallstones. No biliary dilatation. 3. Chronic L1 compression fracture. 4. Coronary artery disease. Clinical correlation recommended for risk factor mitigation and risk stratification. Electronically signed  by: Francis Quam MD 07/12/2024 12:17 AM EST RP Workstation: [id number]   CT HEAD WO CONTRAST ( ) Result Date: 07/11/2024 EXAM: CT HEAD WITHOUT CONTRAST 07/11/2024 11:04:56 PM TECHNIQUE: CT of the head was performed without the administration of intravenous contrast. Automated exposure control, iterative reconstruction, and/or weight based adjustment of the mA/kV was utilized to reduce the radiation dose to as low as reasonably achievable. COMPARISON: None available. CLINICAL HISTORY: Mental status change, unknown cause  FINDINGS: BRAIN AND VENTRICLES: No acute hemorrhage. No evidence of acute infarct. No hydrocephalus. No extra-axial collection. No mass effect or midline shift. ORBITS: No acute abnormality. SINUSES: Right OMC obstructive pattern of paranasal sinus disease. Chronic left lamina papyracea fracture. SOFT TISSUES AND SKULL: No acute soft tissue abnormality. No skull fracture. IMPRESSION: 1. No acute intracranial abnormality. 2. 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 Electronically signed by: Gilmore Molt 07/11/2024 11:48 PM EST RP Workstation: [id number]    Assessment and Plan: 75F h/o alcohol abuse, anemia, polyneuropathy, vertigo, previous stroke with residual right-sided deficits who p/w hypotension/generalized weakness and found to have sepsis 2/2 acute cystitis c/b proctitis on CT.  Sepsis 2/2 acute cystitis c/b proctitis on CT As evidenced by lactic acid 3.3 on arrival and hypotension w/ SBP <90 -CCM consulted; apprec eval/res -Start IV lephophed per protocol -Continue IV Zosyn  for now per pharmacy protocol; adjust abx pending cultures below -Continue MIVF: LR at 150cc/h for now -Senna/docusate and miralax BID for now -F/u urine and blood cultures and adjust abx accordingly  Dysphagia -SLP consulted; apprec eval/recs -F/u esophagram   Advance Care Planning:   Code Status: Full Code   Consults: N/A  Family Communication: Mother  Severity of Illness: The appropriate patient status for this patient is INPATIENT. Inpatient status is judged to be reasonable and necessary in order to provide the required intensity of service to ensure the patient's safety. The patient's presenting symptoms, physical exam findings, and initial radiographic and laboratory data in the context of their chronic comorbidities is felt to place them at high risk for further clinical deterioration. Furthermore, it is not anticipated that the patient will be medically stable for discharge from the  hospital within 2 midnights of admission.   * I certify that at the point of admission it is my clinical judgment that the patient will require inpatient hospital care spanning beyond 2 midnights from the point of admission due to high intensity of service, high risk for further deterioration and high frequency of surveillance required.*   ------- I spent 57 minutes reviewing previous notes, at the bedside counseling/discussing the treatment plan, and performing clinical documentation.  Author: Marsha Ada, MD 07/12/2024 8:16 AM  For on call review [email or web address].      [1] No Known Allergies  "

## 2024-07-12 NOTE — ED Notes (Signed)
 CCMD called.

## 2024-07-12 NOTE — Progress Notes (Signed)
 Pharmacy Antibiotic Note  Veronica Gaines is a 53 y.o. female admitted on 07/11/2024 with sepsis.  Pharmacy has been consulted for Vancomycin  dosing.  Plan: Vancomycin  1000 mg Q12 H. Targeting AUC 400-600, Estimated AUC 508.   Weight: 81.6 kg (179 lb 14.3 oz)  Temp (24hrs), Avg:97.9 F (36.6 C), Min:97.4 F (36.3 C), Max:98.7 F (37.1 C)  Recent Labs  Lab 07/11/24 2112 07/11/24 2120 07/11/24 2334 07/12/24 0815  WBC 5.6  --   --  4.4  CREATININE 1.02*  --   --  0.83  LATICACIDVEN  --  3.3* 2.1*  --     Estimated Creatinine Clearance: 85.1 mL/min (by C-G formula based on SCr of 0.83 mg/dL).    Allergies[1]  Antimicrobials this admission: Zosyn  12/27 >>  Vancomycin  12/28 >>   Microbiology results: 12/28 BCx: 1 of 4 E faecalis  Thank you for allowing pharmacy to be a part of this patients care.  Larraine Brazier, PharmD Clinical Pharmacist 07/12/2024  7:15 PM **Pharmacist phone directory can now be found on [email or web address] ([email or web address]).  Listed under [facility].      [1] No Known Allergies

## 2024-07-13 ENCOUNTER — Inpatient Hospital Stay (HOSPITAL_COMMUNITY)

## 2024-07-13 DIAGNOSIS — F1090 Alcohol use, unspecified, uncomplicated: Secondary | ICD-10-CM

## 2024-07-13 DIAGNOSIS — A4181 Sepsis due to Enterococcus: Secondary | ICD-10-CM

## 2024-07-13 DIAGNOSIS — K529 Noninfective gastroenteritis and colitis, unspecified: Secondary | ICD-10-CM

## 2024-07-13 DIAGNOSIS — B952 Enterococcus as the cause of diseases classified elsewhere: Secondary | ICD-10-CM

## 2024-07-13 DIAGNOSIS — F1010 Alcohol abuse, uncomplicated: Secondary | ICD-10-CM | POA: Diagnosis not present

## 2024-07-13 DIAGNOSIS — R7881 Bacteremia: Secondary | ICD-10-CM

## 2024-07-13 DIAGNOSIS — R197 Diarrhea, unspecified: Secondary | ICD-10-CM | POA: Diagnosis not present

## 2024-07-13 DIAGNOSIS — G629 Polyneuropathy, unspecified: Secondary | ICD-10-CM | POA: Diagnosis not present

## 2024-07-13 DIAGNOSIS — R6521 Severe sepsis with septic shock: Secondary | ICD-10-CM

## 2024-07-13 DIAGNOSIS — R1314 Dysphagia, pharyngoesophageal phase: Secondary | ICD-10-CM | POA: Diagnosis not present

## 2024-07-13 DIAGNOSIS — E876 Hypokalemia: Secondary | ICD-10-CM | POA: Diagnosis not present

## 2024-07-13 DIAGNOSIS — Z862 Personal history of diseases of the blood and blood-forming organs and certain disorders involving the immune mechanism: Secondary | ICD-10-CM

## 2024-07-13 DIAGNOSIS — I38 Endocarditis, valve unspecified: Secondary | ICD-10-CM | POA: Diagnosis not present

## 2024-07-13 DIAGNOSIS — N3000 Acute cystitis without hematuria: Secondary | ICD-10-CM | POA: Diagnosis not present

## 2024-07-13 LAB — CBC
HCT: 31.9 % — ABNORMAL LOW (ref 36.0–46.0)
Hemoglobin: 10.8 g/dL — ABNORMAL LOW (ref 12.0–15.0)
MCH: 32 pg (ref 26.0–34.0)
MCHC: 33.9 g/dL (ref 30.0–36.0)
MCV: 94.4 fL (ref 80.0–100.0)
Platelets: 185 K/uL (ref 150–400)
RBC: 3.38 MIL/uL — ABNORMAL LOW (ref 3.87–5.11)
RDW: 16.5 % — ABNORMAL HIGH (ref 11.5–15.5)
WBC: 4.3 K/uL (ref 4.0–10.5)
nRBC: 0 % (ref 0.0–0.2)

## 2024-07-13 LAB — MAGNESIUM: Magnesium: 1.4 mg/dL — ABNORMAL LOW (ref 1.7–2.4)

## 2024-07-13 LAB — ECHOCARDIOGRAM COMPLETE
Area-P 1/2: 4.8 cm2
S' Lateral: 2.5 cm
Single Plane A4C EF: 58.3 %
Weight: 2878.33 [oz_av]

## 2024-07-13 LAB — BASIC METABOLIC PANEL WITH GFR
Anion gap: 9 (ref 5–15)
BUN: 17 mg/dL (ref 6–20)
CO2: 31 mmol/L (ref 22–32)
Calcium: 8 mg/dL — ABNORMAL LOW (ref 8.9–10.3)
Chloride: 95 mmol/L — ABNORMAL LOW (ref 98–111)
Creatinine, Ser: 0.89 mg/dL (ref 0.44–1.00)
GFR, Estimated: >60 mL/min
Glucose, Bld: 84 mg/dL (ref 70–99)
Potassium: 2.9 mmol/L — ABNORMAL LOW (ref 3.5–5.1)
Sodium: 135 mmol/L (ref 135–145)

## 2024-07-13 LAB — GLUCOSE, CAPILLARY
Glucose-Capillary: 103 mg/dL — ABNORMAL HIGH (ref 70–99)
Glucose-Capillary: 72 mg/dL (ref 70–99)
Glucose-Capillary: 83 mg/dL (ref 70–99)
Glucose-Capillary: 65 mg/dL — ABNORMAL LOW (ref 70–99)
Glucose-Capillary: 80 mg/dL (ref 70–99)
Glucose-Capillary: 99 mg/dL (ref 70–99)

## 2024-07-13 MED ORDER — CHLORHEXIDINE GLUCONATE CLOTH 2 % EX PADS
6.0000 | MEDICATED_PAD | Freq: Every day | CUTANEOUS | Status: DC
  Administered 2024-07-13 – 2024-07-15 (×2): 6 via TOPICAL

## 2024-07-13 MED ORDER — MAGNESIUM SULFATE 2 GM/50ML IV SOLN
2.0000 g | Freq: Once | INTRAVENOUS | Status: AC
  Administered 2024-07-13: 2 g via INTRAVENOUS
  Filled 2024-07-13: qty 50

## 2024-07-13 MED ORDER — POTASSIUM CHLORIDE 10 MEQ/100ML IV SOLN
10.0000 meq | INTRAVENOUS | Status: AC
  Administered 2024-07-13 (×2): 10 meq via INTRAVENOUS
  Filled 2024-07-13 (×2): qty 100

## 2024-07-13 MED ORDER — ACETAMINOPHEN 325 MG PO TABS
650.0000 mg | ORAL_TABLET | ORAL | Status: DC | PRN
  Filled 2024-07-13: qty 2

## 2024-07-13 MED ORDER — POTASSIUM CHLORIDE CRYS ER 20 MEQ PO TBCR
40.0000 meq | EXTENDED_RELEASE_TABLET | Freq: Once | ORAL | Status: AC
  Administered 2024-07-13: 40 meq via ORAL
  Filled 2024-07-13: qty 2

## 2024-07-13 MED ORDER — SODIUM CHLORIDE 0.9 % IV SOLN
2.0000 g | INTRAVENOUS | Status: DC
  Administered 2024-07-13 – 2024-07-20 (×33): 2 g via INTRAVENOUS
  Filled 2024-07-13 (×45): qty 2000

## 2024-07-13 NOTE — Progress Notes (Addendum)
 Discussed medications and benefits expected side effect. Patient refused KCL, Vancomycin , Zosyn ,  Lovenox , Vitamins B6, B12, folic acid  and thiamine . Additionally she has refused Esophogram.

## 2024-07-13 NOTE — Progress Notes (Signed)
 eLink Physician-Brief Progress Note Patient Name: MYEISHA KRUSER DOB: 1971/03/15 MRN: [id number]   Date of Service  07/13/2024  HPI/Events of Note  Hypomagnesemia.  eICU Interventions  Replacement ordered IV.     Intervention Category Minor Interventions: Electrolytes abnormality - evaluation and management  Jerilynn Berg 07/13/2024, 6:38 AM

## 2024-07-13 NOTE — Progress Notes (Signed)
 Patient refuses to wear vital sign monitoring, patient educated, still refused. Charge nurse notified and also spoke with patient, still uncooperative.

## 2024-07-13 NOTE — Progress Notes (Signed)
" °  Echocardiogram 2D Echocardiogram has been performed.  Koleen KANDICE Popper, RDCS 07/13/2024, 5:28 PM "

## 2024-07-13 NOTE — Progress Notes (Addendum)
 "  NAME:  Veronica Gaines, MRN:  [id number], DOB:  Feb 25, 1971, LOS: 1 ADMISSION DATE:  07/11/2024, CONSULTATION DATE:  07/13/2024  REFERRING MD:  TRH, CHIEF COMPLAINT:  suprapubic pain   History of Present Illness:  53 year old female patient with multiple comorbidities as listed below presented to the emergency room with frequent unexplained bowel movements/persistent diarrhea.  On arrival was hypotensive, tachycardic, and tachypneic.  Lactate initially 3.3, improved to 2.1 with fluids.  CT head was negative, CT abdomen pelvis showed proctocolitis in L1 compression fracture. Further laboratory evaluation identified following: Normal white blood cell count Initial lactate 3.3, cleared to 2.1, Had 1 out of 2 blood cultures positive for GPC in the aerobic bottle only UA urine was turbid protein positive large amount of leukocytes many bacteria. Cultures were sent, received a total of 3 L of crystalloid, broad-spectrum antibiotics were started, and patient remained tensive so norepinephrine  was started by the medical service just before 1 PM on 12/28.  As of 4 PM was still on norepinephrine  so critical care was asked to evaluate.   Pertinent  Medical History  Alcohol abuse, anemia, polyneuropathy, vertigo, prior stroke with residual right-sided weakness  Significant Hospital Events: Including procedures, antibiotic start and stop dates in addition to other pertinent events   12/28 admit to ICU  Interim History / Subjective:  Feeling better. IVs are burning.   Objective    Blood pressure 91/68, pulse 82, temperature 97.8 F (36.6 C), temperature source Oral, resp. rate 18, weight 81.6 kg, SpO2 100%.        Intake/Output Summary (Last 24 hours) at 07/13/2024 0858 Last data filed at 07/13/2024 0700 Gross per 24 hour  Intake 2356.18 ml  Output --  Net 2356.18 ml   Filed Weights   07/12/24 0830 07/12/24 1716  Weight: 72.6 kg 81.6 kg    Examination: General: middle aged female in  NAD HENT: [place]/AT, PERRL, no JVD Lungs: Clear bilateral breath sounds Cardiovascular: RRR no MRG Abdomen: Soft, NT, nD Neuro: alert oriented no deficits   Resolved problem list   Assessment and Plan  Septic shock secondary to acute cystitis, probably bacteremia with BCID growing e. Faecalis, +/- proctocolitis.  Received over 3 L of crystalloid and remained hypotensive, Treated with NE infusion, now off.  Plan Continue IV hydration NE for MAP goal 65 now off.  Continue Zosyn  for now given proctitis in addition to the cystitis. Discontinue vancomycin  Await BC finalization. Hold any antihypertensives Echo  History of chronic anemia Plan Follow-up CBC Transfusion trigger less than 7 Thiamine , folate  Chronic diarrhea Now with proctitis Infectious? UC? Plan - Sending stool evaluation - GI consult when more stable.   Esophageal dysphagia with regurgitation - Barium swallow, esophagram recommended once out of ICU.  Hypokalemia - replete  ETOH - reports use has decreased. Down to 2 glasses of wine per day.  - Thiamine , folate.   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  - outpatient follow up   Labs   CBC: Recent Labs  Lab 07/11/24 2112 07/12/24 0815 07/13/24 0151  WBC 5.6 4.4 4.3  HGB 13.7 11.0* 10.8*  HCT 39.6 31.4* 31.9*  MCV 93.8 93.2 94.4  PLT 221 194 185    Basic Metabolic Panel: Recent Labs  Lab 07/11/24 2112 07/12/24 0815 07/13/24 0151  NA 131*  --  135  K 3.0*  --  2.9*  CL 86*  --  95*  CO2 32  --  31  GLUCOSE 86  --  84  BUN 27*  --  17  CREATININE 1.02* 0.83 0.89  CALCIUM 8.7*  --  8.0*  MG  --   --  1.4*   GFR: Estimated Creatinine Clearance: 79.4 mL/min (by C-G formula based on SCr of 0.89 mg/dL). Recent Labs  Lab 07/11/24 2112 07/11/24 2120 07/11/24 2334 07/12/24 0815 07/13/24 0151  WBC 5.6  --   --  4.4 4.3  LATICACIDVEN  --  3.3* 2.1*  --   --     Liver Function Tests: Recent Labs  Lab 07/11/24 2112   AST 48*  ALT 15  ALKPHOS 140*  BILITOT 0.5  PROT 6.5  ALBUMIN 2.5*   No results for input(s): LIPASE, AMYLASE in the last 168 hours. No results for input(s): AMMONIA in the last 168 hours.  ABG No results found for: PHART, PCO2ART, PO2ART, HCO3, TCO2, ACIDBASEDEF, O2SAT   Coagulation Profile: No results for input(s): INR, PROTIME in the last 168 hours.  Cardiac Enzymes: No results for input(s): CKTOTAL, CKMB, CKMBINDEX, TROPONINI in the last 168 hours.  HbA1C: No results found for: HGBA1C  CBG: Recent Labs  Lab 07/12/24 1718 07/12/24 1929 07/12/24 2324 07/13/24 0403 07/13/24 0758  GLUCAP 80 72 80 103* 72    Review of Systems:   Negative unless discussed in HPI above  Past Medical History:  She,  has a past medical history of Alcohol abuse, Anemia, Polyneuropathy, and Vertigo.   Surgical History:   Past Surgical History:  Procedure Laterality Date   CESAREAN SECTION     CHOLECYSTECTOMY       Social History:   reports that she has been smoking cigarettes. She has never used smokeless tobacco. She reports current alcohol use. She reports that she does not use drugs.   Family History:  Her family history is not on file.   Allergies Allergies[1]   Home Medications  Prior to Admission medications  Medication Sig Start Date End Date Taking? Authorizing Provider  amoxicillin (AMOXIL) 500 MG capsule Take 500 mg by mouth 3 (three) times daily. 07/02/24  Yes [provider]  aspirin  EC 81 MG tablet Take 81 mg by mouth daily. Swallow whole.   Yes [provider]  chlorhexidine  (PERIDEX ) 0.12 % solution Use as directed 15 mLs in the mouth or throat 2 (two) times daily. 07/03/24  Yes [provider]  cyanocobalamin  (VITAMIN B12) 1000 MCG tablet Take 1 tablet (1,000 mcg total) by mouth daily. 04/15/24 07/14/24 Yes Pahwani, Fredia, MD  folic acid  (FOLVITE ) 1 MG tablet Take 1 tablet (1 mg total) by mouth daily.  04/16/24 07/15/24 Yes Pahwani, Ravi, MD  ibuprofen (ADVIL) 600 MG tablet Take 600 mg by mouth every 8 (eight) hours as needed for moderate pain (pain score 4-6). 07/02/24  Yes [provider]  pyridOXINE  (VITAMIN B6) 50 MG tablet Take 1 tablet (50 mg total) by mouth daily. 04/17/24  Yes Khaliqdina, Salman, MD  thiamine  (VITAMIN B1) 100 MG tablet Take 1 tablet (100 mg total) by mouth daily. 04/15/24 07/14/24 Yes Vernon Fredia, MD     Critical care time:    Moderate MDM necessary for patient care.   Deward Eastern, AGACNP-BC [facility]  See Amion for personal pager PCCM on call pager [phone] until 7pm. Please call Elink 7p-7a. [phone]  07/13/2024 9:08 AM          [1] No Known Allergies  "

## 2024-07-13 NOTE — Consult Note (Signed)
 "        [facility]    Date of Admission:  07/11/2024     Total days of antibiotics 1   Zosyn          Reason for Consult: Enterococcal Bacteremia     Referring Provider: vigilanz Primary Care Provider: Patient, No Pcp Per   Assessment: Veronica Gaines is a 53 y.o. female admitted with diarrhea, weakness and found to have enterococcal bacteremia   Enterococcal Faecalis Bacteremia -  DENOVA score is < 3 without seeing her TTE results. If we can't get a clearer picture of timeline we should consider her for TEE to ensure no endocarditis in the setting of more smoldering picture.  - Would favor a more narrow spectrum antibiotic for her given the diarrhea and target the enterococcal bacteremia.  - Repeat blood cultures ordered  - FU TTE for indications that would warrant consideration of TEE (DENOVA score 1-2 presently)   Weakness of LEs -  Neuropathy -  Following with neurology and thought to be related to deficiencies in the setting of alcoholism. They did MRI in October which were unremarkable.  She does not report any back pain making discitis less likely   Diarrhea -  If no further stool over night, would d/c enteric studies   ETOH Abuse -  History of falls and alcohol use documented in the record. Liver has some mild steatosis on imaging w/o mass.    Plan: FU TTE findings FU repeat blood cultures  Narrow to ampicillin  alone.  If no further loose stools today, would D/C    Principal Problem:   Sepsis (HCC)    aspirin  EC  81 mg Oral Daily   Chlorhexidine  Gluconate Cloth  6 each Topical Daily   cyanocobalamin   1,000 mcg Oral Daily   enoxaparin  (LOVENOX ) injection  40 mg Subcutaneous Daily   folic acid   1 mg Oral Daily   pyridOXINE   50 mg Oral Daily   thiamine   100 mg Oral Daily    HPI: Veronica Gaines is a 53 y.o. female admitted from home with weakness, diarrhea.   She said she has been feeling ill for a few weeks. Was going 5-6  times a day with liquid stool during the evaluation for PT on 12/04.  Started turning black. No fevers or chills at home.  She lives alone.  No pacemaker, artificial joints or material. ' Having a hard time passing urine today but not necessarily prior to that.  She is upset because she feels everyone rushes her and does things at the same time and gets frustrated over this.   Review of Systems: Review of Systems  Constitutional:  Positive for chills and fever.  Gastrointestinal:  Positive for abdominal pain and diarrhea.  Musculoskeletal:  Positive for falls. Negative for back pain.  Neurological:  Positive for weakness.    Past Medical History:  Diagnosis Date   Alcohol abuse    Anemia    Polyneuropathy    Vertigo     Social History[1]  No family history on file. Allergies[2]  OBJECTIVE: Blood pressure 94/66, pulse 81, temperature 97.8 F (36.6 C), temperature source Oral, resp. rate 14, weight 81.6 kg, SpO2 98%.  Physical Exam Vitals reviewed.  Cardiovascular:     Rate and Rhythm: Normal rate and regular rhythm.     Heart sounds: No murmur heard. Skin:    General: Skin is warm and dry.     Capillary Refill: Capillary refill  takes less than 2 seconds.     Findings: No rash.  Neurological:     Mental Status: She is alert.     Comments: Oriented, gets overwhelmed easily during conversation      Lab Results Lab Results  Component Value Date   WBC 4.3 07/13/2024   HGB 10.8 (L) 07/13/2024   HCT 31.9 (L) 07/13/2024   MCV 94.4 07/13/2024   PLT 185 07/13/2024    Lab Results  Component Value Date   CREATININE 0.89 07/13/2024   BUN 17 07/13/2024   NA 135 07/13/2024   K 2.9 (L) 07/13/2024   CL 95 (L) 07/13/2024   CO2 31 07/13/2024    Lab Results  Component Value Date   ALT 15 07/11/2024   AST 48 (H) 07/11/2024   ALKPHOS 140 (H) 07/11/2024   BILITOT 0.5 07/11/2024     Microbiology: Recent Results (from the past 240 hours)  Blood culture (routine x 2)      Status: Abnormal (Preliminary result)   Collection Time: 07/11/24  9:29 PM   Specimen: BLOOD  Result Value Ref Range Status   Specimen Description BLOOD SITE NOT SPECIFIED  Final   Special Requests   Final    BOTTLES DRAWN AEROBIC AND ANAEROBIC Blood Culture adequate volume   Culture  Setup Time   Final    GRAM POSITIVE COCCI AEROBIC BOTTLE ONLY CRITICAL RESULT CALLED TO, READ BACK BY AND VERIFIED WITH: PHARMD Larraine Redo on [id number] @1840  by SM    Culture (A)  Final    ENTEROCOCCUS FAECALIS SUSCEPTIBILITIES TO FOLLOW Performed at [facility], [address], [place], [place] [address]    Report Status PENDING  Incomplete  Blood Culture ID Panel (Reflexed)     Status: Abnormal   Collection Time: 07/11/24  9:29 PM  Result Value Ref Range Status   Enterococcus faecalis DETECTED (A) NOT DETECTED Final    Comment: CRITICAL RESULT CALLED TO, READ BACK BY AND VERIFIED WITH: PHARMD Larraine Redo on [id number] @1840  by SM    Enterococcus Faecium NOT DETECTED NOT DETECTED Final   Listeria monocytogenes NOT DETECTED NOT DETECTED Final   Staphylococcus species NOT DETECTED NOT DETECTED Final   Staphylococcus aureus (BCID) NOT DETECTED NOT DETECTED Final   Staphylococcus epidermidis NOT DETECTED NOT DETECTED Final   Staphylococcus lugdunensis NOT DETECTED NOT DETECTED Final   Streptococcus species NOT DETECTED NOT DETECTED Final   Streptococcus agalactiae NOT DETECTED NOT DETECTED Final   Streptococcus pneumoniae NOT DETECTED NOT DETECTED Final   Streptococcus pyogenes NOT DETECTED NOT DETECTED Final   A.calcoaceticus-baumannii NOT DETECTED NOT DETECTED Final   Bacteroides fragilis NOT DETECTED NOT DETECTED Final   Enterobacterales NOT DETECTED NOT DETECTED Final   Enterobacter cloacae complex NOT DETECTED NOT DETECTED Final   Escherichia coli NOT DETECTED NOT DETECTED Final   Klebsiella aerogenes NOT DETECTED NOT DETECTED Final   Klebsiella oxytoca NOT DETECTED NOT DETECTED Final    Klebsiella pneumoniae NOT DETECTED NOT DETECTED Final   Proteus species NOT DETECTED NOT DETECTED Final   Salmonella species NOT DETECTED NOT DETECTED Final   Serratia marcescens NOT DETECTED NOT DETECTED Final   Haemophilus influenzae NOT DETECTED NOT DETECTED Final   Neisseria meningitidis NOT DETECTED NOT DETECTED Final   Pseudomonas aeruginosa NOT DETECTED NOT DETECTED Final   Stenotrophomonas maltophilia NOT DETECTED NOT DETECTED Final   Candida albicans NOT DETECTED NOT DETECTED Final   Candida auris NOT DETECTED NOT DETECTED Final   Candida glabrata NOT DETECTED  NOT DETECTED Final   Candida krusei NOT DETECTED NOT DETECTED Final   Candida parapsilosis NOT DETECTED NOT DETECTED Final   Candida tropicalis NOT DETECTED NOT DETECTED Final   Cryptococcus neoformans/gattii NOT DETECTED NOT DETECTED Final   Vancomycin  resistance NOT DETECTED NOT DETECTED Final    Comment: Performed at [facility], [address] [place], [place] [address]  Blood culture (routine x 2)     Status: None (Preliminary result)   Collection Time: 07/11/24  9:34 PM   Specimen: BLOOD  Result Value Ref Range Status   Specimen Description BLOOD SITE NOT SPECIFIED  Final   Special Requests   Final    BOTTLES DRAWN AEROBIC AND ANAEROBIC Blood Culture results may not be optimal due to an inadequate volume of blood received in culture bottles   Culture   Final    NO GROWTH 2 DAYS Performed at [facility], [address] [place], [place] [address]    Report Status PENDING  Incomplete  Culture, blood (Routine X 2) w Reflex to ID Panel     Status: None (Preliminary result)   Collection Time: 07/12/24  8:29 PM   Specimen: BLOOD  Result Value Ref Range Status   Specimen Description BLOOD BLOOD LEFT HAND  Final   Special Requests   Final    BOTTLES DRAWN AEROBIC AND ANAEROBIC Blood Culture adequate volume   Culture   Final    NO GROWTH < 12 HOURS Performed at [facility], [address], [place], [place] [address]    Report Status PENDING  Incomplete  Culture, blood (Routine X 2) w Reflex to ID Panel     Status: None (Preliminary result)   Collection Time: 07/12/24  8:29 PM   Specimen: BLOOD  Result Value Ref Range Status   Specimen Description BLOOD BLOOD RIGHT ARM  Final   Special Requests   Final    BOTTLES DRAWN AEROBIC AND ANAEROBIC Blood Culture results may not be optimal due to an inadequate volume of blood received in culture bottles   Culture   Final    NO GROWTH < 12 HOURS Performed at [facility], [address] [place], [place] [address]    Report Status PENDING  Incomplete     Corean Fireman, MSN, NP-C [facility]  [email or web address] Pager: [phone] Office: [phone] [facility] Main Line: [phone] *Secure Chat Communication Welcome       [1]  Social History Tobacco Use   Smoking status: Every Day    Current packs/day: 0.50    Types: Cigarettes   Smokeless tobacco: Never  Vaping Use   Vaping status: Never Used  Substance Use Topics   Alcohol use: Yes    Comment: daily   Drug use: No  [2] No Known Allergies  "

## 2024-07-13 NOTE — Progress Notes (Signed)
 KCL infusing, patient states IV site is very painful and burning. She requests for IV KCL to be stopped and IV site removed. LAC site removed erythema edema present and Rt Hand removed due to complaint of pain, no erythema or edema present. Pt made aware secondary IV access may be necessary to continue plan of care.

## 2024-07-14 ENCOUNTER — Encounter (HOSPITAL_COMMUNITY): Payer: Self-pay | Admitting: Hospitalist

## 2024-07-14 DIAGNOSIS — A419 Sepsis, unspecified organism: Secondary | ICD-10-CM

## 2024-07-14 LAB — CULTURE, BLOOD (ROUTINE X 2): Special Requests: ADEQUATE

## 2024-07-14 MED ORDER — NICOTINE POLACRILEX 2 MG MT GUM: 2.0000 mg | CHEWING_GUM | OROMUCOSAL | Status: DC | PRN

## 2024-07-14 MED ORDER — HYDROXYZINE HCL 25 MG PO TABS: 25.0000 mg | ORAL_TABLET | Freq: Four times a day (QID) | ORAL | Status: AC | PRN

## 2024-07-14 MED ORDER — CHLORDIAZEPOXIDE HCL 25 MG PO CAPS
25.0000 mg | ORAL_CAPSULE | ORAL | Status: AC
  Administered 2024-07-16: 25 mg via ORAL
  Filled 2024-07-14: qty 1

## 2024-07-14 MED ORDER — NICOTINE 21 MG/24HR TD PT24
21.0000 mg | MEDICATED_PATCH | Freq: Every day | TRANSDERMAL | Status: DC
  Administered 2024-07-14 – 2024-07-24 (×9): 21 mg via TRANSDERMAL
  Filled 2024-07-14 (×11): qty 1

## 2024-07-14 MED ORDER — LOPERAMIDE HCL 2 MG PO CAPS: 2.0000 mg | ORAL_CAPSULE | ORAL | Status: AC | PRN

## 2024-07-14 MED ORDER — CHLORDIAZEPOXIDE HCL 25 MG PO CAPS
25.0000 mg | ORAL_CAPSULE | Freq: Three times a day (TID) | ORAL | Status: AC
  Administered 2024-07-15: 25 mg via ORAL
  Filled 2024-07-14 (×2): qty 1

## 2024-07-14 MED ORDER — CHLORDIAZEPOXIDE HCL 25 MG PO CAPS
25.0000 mg | ORAL_CAPSULE | Freq: Four times a day (QID) | ORAL | Status: AC
  Administered 2024-07-14 (×2): 25 mg via ORAL
  Filled 2024-07-14 (×3): qty 1

## 2024-07-14 MED ORDER — CHLORDIAZEPOXIDE HCL 25 MG PO CAPS
25.0000 mg | ORAL_CAPSULE | Freq: Every day | ORAL | Status: AC
  Administered 2024-07-17: 25 mg via ORAL
  Filled 2024-07-14: qty 1

## 2024-07-14 MED ORDER — CHLORDIAZEPOXIDE HCL 25 MG PO CAPS: 25.0000 mg | ORAL_CAPSULE | Freq: Four times a day (QID) | ORAL | Status: DC | PRN

## 2024-07-14 MED ORDER — ADULT MULTIVITAMIN W/MINERALS CH
1.0000 | ORAL_TABLET | Freq: Every day | ORAL | Status: DC
  Administered 2024-07-15 – 2024-07-24 (×10): 1 via ORAL
  Filled 2024-07-14 (×11): qty 1

## 2024-07-14 MED ORDER — CYANOCOBALAMIN 1000 MCG/ML IJ SOLN
1000.0000 ug | Freq: Once | INTRAMUSCULAR | Status: DC
  Filled 2024-07-14: qty 1

## 2024-07-14 MED ORDER — ONDANSETRON 4 MG PO TBDP: 4.0000 mg | ORAL_TABLET | Freq: Four times a day (QID) | ORAL | Status: AC | PRN

## 2024-07-14 NOTE — Progress Notes (Signed)
 TEE request made out of concern this has been more of a smoldering process than we can gauge.   She is not having diarrhea, which makes this more unexplained bacteremia.   D/C enteric precautions and pending studies.   Corean Fireman, FNP

## 2024-07-14 NOTE — Progress Notes (Addendum)
 eLink Physician-Brief Progress Note Patient Name: Veronica Gaines DOB: 16-Jan-1971 MRN: [id number]   Date of Service  07/14/2024  HPI/Events of Note  RN reporting anxiety, irritated, refusing all care, meds, taking of vital signs, doesn't understand pt has infection, has tele orders so they have backed off to vitals every 4, won't wear cont pulse ox       h/o etoh  eICU Interventions  NO tachy or tremors Observe for now, Add CIWA protocol if she develops signs  - DD is ICU delerium Plan D/w RN     Intervention Category Minor Interventions: Agitation / anxiety - evaluation and management  Genessis Flanary V. Romie Keeble 07/14/2024, 12:12 AM   Pt wanting to sign out AMA Spoke to her on the phone & explained the reason for admission & needing longer stay  Defer to rounding team to assess capacity for decision making  5:32 AM

## 2024-07-14 NOTE — Progress Notes (Signed)
 Report given to receiving nurse 6N. Pt's mom and Auntie aware of pt's transfer.

## 2024-07-14 NOTE — Progress Notes (Signed)
 Pt refusing tele monitor.Dr.Danford aware

## 2024-07-14 NOTE — Progress Notes (Signed)
 Pt has 0800 antibiotic order. I explained to pt the importance of her antibiotics. Pt said she is not going to receive med until she is able to smoke a cigarette, take a shower and move to a different room.

## 2024-07-14 NOTE — Hospital Course (Addendum)
 53 y.o. 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  In the ER, found to have lactic acidosis, hypotension and proctitis.  Admitted on antibiotics then became hypotensive and so was started on Levophed  and admited to ICU.  Subsequently found to have E faecalis bacteremia.  ID also consulted.  Assessment and Plan:  Septic shock due to enterococcal bacteremia - resolved  E. Faecalis bacteremia  - Admitted with tachycardia, tachypnea, hypotension, on antibiotics and pressors, found to have Enterococcus faecalis bacteremia. - Repeat cultures from 07/12/2024 remain negative -ID following, appreciate assistance - TEE performed 1/5. 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  Neurology were consulted.  Underwent MRI of the brain, CT and L-spine, that were unremarkable.   RPR, TSH normal.  Was found to have B12 deficiency, folate deficiency, and B6 deficiency, started on supplementation.  Since discharge, she reports she has not been eating, not been walking.  She started having diarrhea and believes family members told her to stop taking vitamin B12 because this was causing the diarrhea. - continue oral B12; level sufficient on 12/31 - Continue pyridoxine , folate, thiamine , MVI - PT/OT; SNF recommended  - Needs PCP for repeat B12, folate and B6 levels in  2 months   Globus sensation Reported to SLP earlier in hospital stay, none since.  Esophagram 1/2 limited due to patient poor cooperation, but only mild narrowing distal esophagus. - Continue regular diet   Right-sided weakness Resolved.  Likely acute on chronic weakness due to infection.   Alcoholism Alcohol withdrawal, mild - resolved  Reports drinking 4 standard drinks per day, typically liquor.  Feels that she is having withdrawals.  No history of complicated withdrawals. Completed Librium  taper here.   Hypomagnesemia Hypokalemia - Supplement potassium and magnesium    Asymptomatic bacteriuria No symptoms elicited by me today -Hold antibiotics for now, monitor

## 2024-07-14 NOTE — Progress Notes (Signed)
 Speech Language Pathology Treatment: Dysphagia  Patient Details Name: Veronica Gaines MRN: [id number] DOB: Mar 08, 1971 Today's Date: 07/14/2024 Time: 8563-8498 SLP Time Calculation (min) (ACUTE ONLY): 25 min  Assessment / Plan / Recommendation Clinical Impression  SLP plan was to f/u after esophagram, however pt has refused this test. Skilled observation was provided as pt self-fed regular textures and thin liquids. Prolonged and one-sided mastication is noted but not unexpected given pt report of dental issues on her R side. She does not have overt oropharyngeal dysphagia otherwise. Education was offered about esophageal precautions although pt is tangential and has some difficulty maintaining topic. She does however acknowledge the impact that certain food choices make on her GERD and her hiatal hernia (like the hot dog she is currently eating, topped with chili and onions). She says she has been worked up in the past by her GI provider and has learned what typically works best for her.   PLAN: SLP to sign off for now. Will leave on regular diet and thin liquids, encouraging use of esophageal precautions. If any acute needs arise, or if she does complete the esophagram and there are oropharyngeal deficits noted, then please consider reordering SLP at that time.   HPI HPI: 53 yo female admitted from home 07/11/24 with worsening right weakness. CTHead: No acute intracranial abnormality. No prior ST intervention. 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, alcohol abuse, recurrent falls      SLP Plan  All goals met        Swallow Evaluation Recommendations   Recommendations: PO diet PO Diet Recommendation: Regular;Thin liquids (Level 0) Liquid Administration via: Cup;Straw Medication Administration: Whole meds with liquid Supervision: Patient able to self-feed;Set-up assistance for safety Postural changes: Position pt fully upright for meals;Stay upright 30-60 min after  meals Oral care recommendations: Oral care BID (2x/day)     Recommendations                     Oral care BID     Dysphagia, unspecified (R13.10)     All goals met     Leita SAILOR., M.A. CCC-SLP Acute Rehabilitation Services Office: [phone]  Secure chat preferred   07/14/2024, 3:05 PM

## 2024-07-14 NOTE — Progress Notes (Signed)
" °  Progress Note   Patient: Veronica Gaines DOB: [birth year] DOA: 07/11/2024     2 DOS: the patient was seen and examined on 07/14/2024 at 10:11 AM      Brief hospital course: 53 y.o. 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  In the ER, found to have lactic acidosis, hypotension and proctitis.  Admitted on antibiotics then became hypotensive and so was started on Levophed  and admited to ICU.  Subsequently found to have enterococcal bacteremia.     Assessment and Plan: Septic shock due to enterococcal bacteremia Admitted with tachycardia, tachypnea, hypotension, on antibiotics and pressors, found to have coccus faecalis bacteremia. -Consult ID - Obtain TEE - Continue ampicillin  -Follow repeat cultures from 12/28   Macrocytic anemia, lower extremity weakness and neuropathy due to nutritional deficiencies Vitamin B12 deficiency Vitamin B6 deficiency Folate deficiency Admitted in September for leg weakness, neuropathy.  Neurology were consulted.  Underwent MRI of the brain, CT and L-spine, that were unremarkable.   RPR, TSH normal.  Was found to have B12 deficiency, folate deficiency, and B6 deficiency, started on supplementation.  Since discharge, she reports she has not been eating, not been walking.  She started having diarrhea and believes family members told her to stop taking vitamin B12 because this was causing the diarrhea. -Resume vitamin B12 injections - Continue pyridoxine , folate - Multivitamin - Oral thiamine  - PT/OT    Right-sided weakness This was present before admission.  I suspect that is acute on chronic weakness from her infection, but if she is able to tolerate MRI during this admission, would plan for MRI brain - Monitor weakness   Alcoholism Alcohol withdrawal, mild Reports drinking 4 standard  drinks per day, typically liquor.  Feels that she is having withdrawals.  No history of complicated withdrawals.   Hypomagnesemia Hypokalemia - Supplement potassium and magnesium    Asymptomatic bacteriuria No symptoms elicited by me today -Hold antibiotics for now, monitor          Subjective: Patient wants to get up, she is restless, she is withdrawing, she would like a shower, she has right-sided weakness.     Physical Exam: BP (!) 125/96 (BP Location: Right Arm)   Pulse 91   Temp (!) 97.2 F (36.2 C) (Oral)   Resp 20   Wt 81.6 kg   SpO2 93%   BMI 28.18 kg/m   Ill-appearing adult female, lying in bed, restless, eyes bloodshot, tired Tachycardic, regular, no murmurs, diffuse nonpitting edema Respiratory rate seems normal, lung sounds diminished, no rales or wheezes appreciated Abdomen soft, no tenderness palpation or guarding She has paresthesias in bilateral legs, she has weakness on the right side and some atrophy of the right hand     Data Reviewed: Discussed with infectious disease Patient metabolic panel shows hypokalemia, hypomagnesemia, normal renal function CBC shows anemia, stable Extensive outpatient records reviewed    Family Communication:     Disposition: Status is: Inpatient         Author: Lonni SHAUNNA Dalton, MD 07/14/2024 3:39 PM  For on call review [email or web address]    "

## 2024-07-14 NOTE — Progress Notes (Signed)
 Pt arrived to 6 north room 5 alert and oriented x4. Pain in buttocks area. Pictures taken of wounds in chart. Swelling noted in ankles and bilateral legs. Redness and peeling on heels and feet. Bed in lowest position. Call light in reach. Bed alarm on. Safety mats on floor. All needs met at this time.

## 2024-07-14 NOTE — Progress Notes (Signed)
 PCCM called to bedside as patient wanting to leave ama. Patient states she doesn't feel like she is getting all the information relayed to her and is irritated not wanting to receive care. I educated patient that she has a bacteremia currently receiving abx and will eventually need TEE. She states that she wants to take care of herself but just wants to go home. She is refusing tele. She called her nephew multiple times last night and states he is on the way to pick her up. Called her mom who is listed contact who states that she is on her way and will help have conversation on her and provider on her arrival to help encourage patient to stay.  JD Emilio RIGGERS [facility] 07/14/2024, 6:53 AM  Please see [email or web address] for pager details.  From 7A-7P if no response, please call [phone]. After hours, please call ELink [phone].

## 2024-07-14 NOTE — Progress Notes (Addendum)
 Patient IV site on left posterior hand is swollen. IV removed and applied ice pack. Cannula intact upon removal.

## 2024-07-15 DIAGNOSIS — A419 Sepsis, unspecified organism: Secondary | ICD-10-CM | POA: Diagnosis not present

## 2024-07-15 LAB — COMPREHENSIVE METABOLIC PANEL WITH GFR
ALT: 13 U/L (ref 0–44)
AST: 25 U/L (ref 15–41)
Albumin: 2.2 g/dL — ABNORMAL LOW (ref 3.5–5.0)
Alkaline Phosphatase: 90 U/L (ref 38–126)
Anion gap: 6 (ref 5–15)
BUN: 8 mg/dL (ref 6–20)
CO2: 33 mmol/L — ABNORMAL HIGH (ref 22–32)
Calcium: 7.9 mg/dL — ABNORMAL LOW (ref 8.9–10.3)
Chloride: 101 mmol/L (ref 98–111)
Creatinine, Ser: 0.59 mg/dL (ref 0.44–1.00)
GFR, Estimated: >60 mL/min
Glucose, Bld: 83 mg/dL (ref 70–99)
Potassium: 3.1 mmol/L — ABNORMAL LOW (ref 3.5–5.1)
Sodium: 140 mmol/L (ref 135–145)
Total Bilirubin: 0.2 mg/dL (ref 0.0–1.2)
Total Protein: 5.1 g/dL — ABNORMAL LOW (ref 6.5–8.1)

## 2024-07-15 LAB — CBC
HCT: 30.4 % — ABNORMAL LOW (ref 36.0–46.0)
Hemoglobin: 10.2 g/dL — ABNORMAL LOW (ref 12.0–15.0)
MCH: 32 pg (ref 26.0–34.0)
MCHC: 33.6 g/dL (ref 30.0–36.0)
MCV: 95.3 fL (ref 80.0–100.0)
Platelets: 160 K/uL (ref 150–400)
RBC: 3.19 MIL/uL — ABNORMAL LOW (ref 3.87–5.11)
RDW: 16.8 % — ABNORMAL HIGH (ref 11.5–15.5)
WBC: 2.7 K/uL — ABNORMAL LOW (ref 4.0–10.5)
nRBC: 0 % (ref 0.0–0.2)

## 2024-07-15 LAB — URINE CULTURE: Culture: >=100000 — AB

## 2024-07-15 LAB — VITAMIN B12: Vitamin B-12: 1699 pg/mL — ABNORMAL HIGH (ref 180–914)

## 2024-07-15 MED ORDER — POTASSIUM CHLORIDE CRYS ER 20 MEQ PO TBCR
40.0000 meq | EXTENDED_RELEASE_TABLET | Freq: Two times a day (BID) | ORAL | Status: AC
  Administered 2024-07-15: 40 meq via ORAL
  Filled 2024-07-15 (×2): qty 2

## 2024-07-15 MED ORDER — ZINC OXIDE 40 % EX OINT
TOPICAL_OINTMENT | Freq: Two times a day (BID) | CUTANEOUS | Status: DC
  Filled 2024-07-15 (×2): qty 57

## 2024-07-15 MED ORDER — MAGNESIUM SULFATE 2 GM/50ML IV SOLN
2.0000 g | Freq: Once | INTRAVENOUS | Status: AC
  Administered 2024-07-15: 2 g via INTRAVENOUS
  Filled 2024-07-15: qty 50

## 2024-07-15 NOTE — Progress Notes (Signed)
 Tele called stating that pt was reading out asystole. Checked on pt she is currently using bedpan and phone of her leads fell off. Replaced green lead and instructed pt to call out when done using the restroom.

## 2024-07-15 NOTE — Progress Notes (Signed)
 Wound care  Buttock: cleanse with soap and water, pat dry with gauze, apply thin layer of Desitin ointment (pharm), monitor every shift. Pad bony areas with Mepilex foam dressings. Off-load heels at all times onto pillows/boots.

## 2024-07-15 NOTE — Consult Note (Addendum)
 This remote consultation was conducted using EMR wound digital images downloaded by the staff member. Pertinent information was reviewed in order to determine recommendations. Coordinated care with primary nurse via Secure Chat.   WOC Nurse Consult Note: Reason for Consult: buttock skin breakdown Wound type: MASD with epidermal sloughing, Friction injuries along skin folds Pressure Injury POA: No But MASD can make the patient prone to PI, staff to monitor for Pis every shift, possible inflammatory process from repeated incontinence. Measurement:wide area of involvement Wound azi:dyjoont red Drainage (amount, consistency, odor)  Periwound:dusky, due to patient's skin pigment difficult to assess for erythema, can not rule out PI at this time, will require an ointment to coat denudes skin and balance out moisture to skin.  Dressing procedure/placement/frequency:  Buttock: cleanse with soap and water, pat dry with gauze, apply thin layer of Desitin ointment (pharm), monitor every shift. Pad bony areas with Mepilex foam dressings. Off-load heels at all times onto pillows/boots. BID   07/14/24 Buttock    WOC will not follow and will remove patient from census task list.  Please reconsult if wound worsens in condition and notify provider.   Sherrilyn Hals MSN RN CWOCN WOC [facility]  [phone] (Available from 7-3 pm Mon-Friday)

## 2024-07-15 NOTE — Progress Notes (Signed)
" °  Progress Note   Patient: Veronica Gaines FMW:[id number] DOB: 1971-07-05 DOA: 07/11/2024     3 DOS: the patient was seen and examined on 07/15/2024 at 9:10 AM      Brief hospital course: 53 y.o. 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  In the ER, found to have lactic acidosis, hypotension and proctitis.  Admitted on antibiotics then became hypotensive and so was started on Levophed  and admited to ICU.  Subsequently found to have enterococcal bacteremia.     Assessment and Plan: See summary from 12/30  Septic shock due to enterococcal bacteremia - Continue ampicillin  - TEE pending   Macrocytic anemia, lower extremity weakness and neuropathy due to nutritional deficiencies Vitamin B12 deficiency Vitamin B6 deficiency Folate deficiency Got IM B12 12/30 The right sided weakness was acute on chronic, related to the above -Continue pyridoxine , folate - Repeat IM B12 tomorrow, plan to repeat 3 times this week, then space out to weekly     Alcoholism Alcohol withdrawal, mild - Continue Librium  taper  - Consult TOC   Hypomagnesemia Hypokalemia - Supplement potassium and magnesium  - Trend Mag, BMP     Asymptomatic bacteriuria Asymptomatic               Subjective: Still has a lot of burning in her legs.  No fever, no vomiting.  No focal weakness.     Physical Exam: BP 95/73 (BP Location: Left Arm)   Pulse 97   Temp 98 F (36.7 C) (Oral)   Resp 16   Wt 81.6 kg   SpO2 93%   BMI 28.18 kg/m   Adult female, lying in bed, interactive and appropriate, tired, seems to have some mild cognitive impairment RRR, no murmurs, no peripheral edema Respiratory rate normal, lungs clear without rales or wheezes   Data Reviewed: Discussed with infectious disease Basic metabolic panel shows hypokalemia CBC shows leukopenia,  mild anemia     Family Communication:     Disposition: Status is: Inpatient         Author: Lonni SHAUNNA Dalton, MD 07/15/2024 2:30 PM  For on call review [email or web address]    "

## 2024-07-15 NOTE — Evaluation (Signed)
 Occupational Therapy Evaluation Patient Details Name: Veronica Gaines MRN: [id number] DOB: 1970-09-11 Today's Date: 07/15/2024   History of Present Illness   53 y.o. female adm 07/11/24 for septic shock d/t enterococcal bacteremia. 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 (Simultaneous filing. User may not have seen previous data.)     Clinical Impressions PTA Pt reports that she was Mod I with RW for functional mobility with 4 falls in past 6 months, and required assistance from family for ADL tasks. Pt currently requires up to Mod A for functional mobility and up to Max A for ADL engagement. Pt is primarily limited by impaired cognition, decreased activity tolerance, decreased fine motor skills of BUE, and pain. OT to continue to follow Pt acutely to facilitate progress towards goals. Patient will benefit from continued inpatient follow up therapy, <3 hours/day.     If plan is discharge home, recommend the following:   A lot of help with walking and/or transfers;A lot of help with bathing/dressing/bathroom;Assistance with cooking/housework;Direct supervision/assist for medications management;Direct supervision/assist for financial management;Assist for transportation;Help with stairs or ramp for entrance     Functional Status Assessment   Patient has had a recent decline in their functional status and demonstrates the ability to make significant improvements in function in a reasonable and predictable amount of time.     Equipment Recommendations   Other (comment) (defer to next venue)     Recommendations for Other Services         Precautions/Restrictions   Precautions Precautions: Fall (Simultaneous filing. User may not have seen previous data.) Recall of Precautions/Restrictions: Impaired (Simultaneous filing. User may not have seen previous data.) Restrictions Weight Bearing Restrictions Per  Provider Order: No (Simultaneous filing. User may not have seen previous data.)     Mobility Bed Mobility Overal bed mobility: Needs Assistance (Simultaneous filing. User may not have seen previous data.) Bed Mobility: Supine to Sit (Simultaneous filing. User may not have seen previous data.)     Supine to sit: Contact guard (Simultaneous filing. User may not have seen previous data.)     General bed mobility comments: CGA for trunk support when elevating trunk off of bed. (Simultaneous filing. User may not have seen previous data.)    Transfers Overall transfer level: Needs assistance (Simultaneous filing. User may not have seen previous data.) Equipment used: Rolling walker (2 wheels) (Simultaneous filing. User may not have seen previous data.) Transfers: Sit to/from Stand, Bed to chair/wheelchair/BSC (Simultaneous filing. User may not have seen previous data.) Sit to Stand: Mod assist (Simultaneous filing. User may not have seen previous data.)     Step pivot transfers: Min assist, Mod assist (Simultaneous filing. User may not have seen previous data.)     General transfer comment: Mod A to rise from bed. Dense verbal cues for proper hand placement on RW, increased time required to stand. Gestural cues to facilitate trunk and knee extension. Min to Mod A for step pivot to recliner with RW as Pt demonstrated posterior lean and occassional L lateral lean. Pt becoming emotional during ambulation regarding current level of function following stroke. Pt pulls up on RW, cues to keep walker on ground and stay within walker. Pt often letting walker get far away from body (Simultaneous filing. User may not have seen previous data.)      Balance Overall balance assessment: Needs assistance Sitting-balance support: Single extremity supported, Feet supported Sitting balance-Leahy Scale: Fair Sitting balance - Comments: Close  supervision for safety. Cannot challenge Postural control: Posterior  lean Standing balance support: Bilateral upper extremity supported, During functional activity, Reliant on assistive device for balance Standing balance-Leahy Scale: Poor Standing balance comment: Dependent on RW and external support                           ADL either performed or assessed with clinical judgement   ADL Overall ADL's : Needs assistance/impaired Eating/Feeding: Minimal assistance Eating/Feeding Details (indicate cue type and reason): Min A to manage and manipulate small food containers Grooming: Minimal assistance;Sitting   Upper Body Bathing: Minimal assistance   Lower Body Bathing: Maximal assistance   Upper Body Dressing : Minimal assistance Upper Body Dressing Details (indicate cue type and reason): Increased time required for UB dressing, Min A beneficial, Pt reluctant to support with task Lower Body Dressing: Maximal assistance   Toilet Transfer: Moderate assistance;Minimal assistance;Stand-pivot;BSC/3in1;Rolling walker (2 wheels) Toilet Transfer Details (indicate cue type and reason): Min to Mod A transfer Toileting- Clothing Manipulation and Hygiene: Moderate assistance               Vision Patient Visual Report: No change from baseline       Perception         Praxis         Pertinent Vitals/Pain Pain Assessment Pain Assessment: Faces (Simultaneous filing. User may not have seen previous data.) Faces Pain Scale: Hurts even more (Simultaneous filing. User may not have seen previous data.) Pain Location: abdomen from constipation and buttocks (Simultaneous filing. User may not have seen previous data.) Pain Descriptors / Indicators: Discomfort, Grimacing, Guarding, Cramping, Sharp (Simultaneous filing. User may not have seen previous data.) Pain Intervention(s): Limited activity within patient's tolerance, Monitored during session, Repositioned (Simultaneous filing. User may not have seen previous data.)     Extremity/Trunk  Assessment Upper Extremity Assessment Upper Extremity Assessment: Generalized weakness;LUE deficits/detail;RUE deficits/detail (Simultaneous filing. User may not have seen previous data.) RUE Deficits / Details: Decreased coordination and FM skills observed in session. Increased time required for UE use. Generalized weakness at all joints. RUE Coordination: decreased fine motor;decreased gross motor LUE Deficits / Details: Decreased coordination and FM skills observed in session. Increased time required for UE use. Generalized weakness at all joints. LUE Coordination: decreased gross motor;decreased fine motor   Lower Extremity Assessment Lower Extremity Assessment: Defer to PT evaluation (Simultaneous filing. User may not have seen previous data.) RLE Deficits / Details: Hip flex 3-/5, Knee ext 3+/5, Knee flex 3/5, Ankle DF 2/5, Ankle PF 3+/5 RLE Sensation: history of peripheral neuropathy RLE Coordination: decreased gross motor LLE Deficits / Details: Hip flex 3/5, Knee ext 4-/5, Knee flex 3+/5, Ankle DF 3+/5, Ankle PF 3+/5 LLE Sensation: history of peripheral neuropathy LLE Coordination: decreased gross motor   Cervical / Trunk Assessment Cervical / Trunk Assessment: Normal   Communication Communication Communication: Impaired (Simultaneous filing. User may not have seen previous data.) Factors Affecting Communication: Difficulty expressing self (verbose; communication deficits following stroke difficulty word finding occassionally  Simultaneous filing. User may not have seen previous data.)   Cognition Arousal: Alert (Simultaneous filing. User may not have seen previous data.) Behavior During Therapy: Anxious, Lability (Simultaneous filing. User may not have seen previous data.) Cognition: Cognition impaired, No family/caregiver present to determine baseline     Awareness: Online awareness impaired   Attention impairment (select first level of impairment): Selective  attention Executive functioning impairment (select all impairments): Organization, Sequencing, Reasoning, Problem  solving OT - Cognition Comments: Pt emotionally labile during session, required increased time to complete tasks. Would get agitated when she felt she was being rushed but easily redirected and participatory. Unreliable historian, information from Pt regarding PLOF and home set-up varied throughout session. Difficulty reasoning. Very verbose, some tangential speech                 Following commands: Impaired (Simultaneous filing. User may not have seen previous data.) Following commands impaired: Follows one step commands with increased time (Simultaneous filing. User may not have seen previous data.)     Cueing  General Comments   Cueing Techniques: Verbal cues;Visual cues;Gestural cues (Simultaneous filing. User may not have seen previous data.)  Pt requires increased time for completing tasks. VSS on RA.   Exercises     Shoulder Instructions      Home Living Family/patient expects to be discharged to:: Private residence (Simultaneous filing. User may not have seen previous data.) Living Arrangements: Parent;Other relatives (Sister, nephew  Simultaneous filing. User may not have seen previous data.) Available Help at Discharge: Family (Simultaneous filing. User may not have seen previous data.) Type of Home: House (Simultaneous filing. User may not have seen previous data.) Home Access: Level entry (Simultaneous filing. User may not have seen previous data.)     Home Layout: Two level;Able to live on main level with bedroom/bathroom (Simultaneous filing. User may not have seen previous data.)     Bathroom Shower/Tub: Tub/shower unit;Sponge bathes at baseline (Simultaneous filing. User may not have seen previous data.)   Bathroom Toilet: Standard (Simultaneous filing. User may not have seen previous data.)     Home Equipment: Rolling Walker (2 wheels)  (Simultaneous filing. User may not have seen previous data.)   Additional Comments: Pt unreliable historian, need to verify. Information from Pt differs slightly from previous admission (Simultaneous filing. User may not have seen previous data.)      Prior Functioning/Environment Prior Level of Function : Needs assist;History of Falls (last six months) (Simultaneous filing. User may not have seen previous data.)       Physical Assist : ADLs (physical)   ADLs (physical): Dressing;IADLs Mobility Comments: Using RW, endorses 4 falls in past 6 months. (Simultaneous filing. User may not have seen previous data.) ADLs Comments: Sponge bathe on toilet, assist to get dressed from daughter. Assist with IADLs (Simultaneous filing. User may not have seen previous data.)    OT Problem List: Decreased strength;Decreased activity tolerance;Impaired balance (sitting and/or standing);Decreased coordination;Decreased cognition;Decreased safety awareness;Decreased knowledge of use of DME or AE;Decreased knowledge of precautions;Pain   OT Treatment/Interventions: Self-care/ADL training;Therapeutic exercise;Energy conservation;DME and/or AE instruction;Therapeutic activities;Patient/family education;Balance training;Cognitive remediation/compensation      OT Goals(Current goals can be found in the care plan section)   Acute Rehab OT Goals Patient Stated Goal: to get better OT Goal Formulation: With patient Time For Goal Achievement: 07/29/24 Potential to Achieve Goals: Good ADL Goals Pt Will Perform Lower Body Bathing: with supervision;with adaptive equipment Pt Will Perform Lower Body Dressing: with min assist;with adaptive equipment;sitting/lateral leans Pt Will Transfer to Toilet: with contact guard assist;ambulating;bedside commode Pt/caregiver will Perform Home Exercise Program: Both right and left upper extremity;With written HEP provided (fine motor) Additional ADL Goal #1: Pt will engage in  bed mobility with supervision as a precursor to ADL tasks OOB.   OT Frequency:  Min 2X/week    Co-evaluation              AM-PAC OT 6  Clicks Daily Activity     Outcome Measure Help from another person eating meals?: A Little Help from another person taking care of personal grooming?: A Little Help from another person toileting, which includes using toliet, bedpan, or urinal?: A Lot Help from another person bathing (including washing, rinsing, drying)?: A Lot Help from another person to put on and taking off regular upper body clothing?: A Little Help from another person to put on and taking off regular lower body clothing?: A Lot 6 Click Score: 15   End of Session Equipment Utilized During Treatment: Rolling walker (2 wheels) Nurse Communication: Mobility status  Activity Tolerance: Patient tolerated treatment well Patient left: in chair;with call bell/phone within reach;with chair alarm set  OT Visit Diagnosis: Unsteadiness on feet (R26.81);Muscle weakness (generalized) (M62.81);History of falling (Z91.81);Other symptoms and signs involving cognitive function;Pain                Time: 1455-1525 OT Time Calculation (min): 30 min Charges:  OT General Charges $OT Visit: 1 Visit OT Evaluation $OT Eval Moderate Complexity: 1 Mod OT Treatments $Therapeutic Activity: 8-22 mins  Maurilio CROME, OTR/L.  [facility]  Office: [phone]   Maurilio PARAS Veronica Gaines 07/15/2024, 5:02 PM

## 2024-07-15 NOTE — Progress Notes (Signed)
 Patient informed about a call from [facility] with phone number [phone], patient stated that no updates will be given unless the patient says so.

## 2024-07-15 NOTE — Evaluation (Signed)
 Physical Therapy Evaluation Patient Details Name: Veronica Gaines MRN: [id number] DOB: [birth year] Today's Date: 07/15/2024  History of Present Illness  53 y.o. female adm 07/11/24 for septic shock d/t enterococcal bacteremia. 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  Clinical Impression  Pt admitted with above diagnosis. Pt is an unreliable/questionable historian, no family/caregiver present to verify information. She reports ambulating very short-distances (5 steps) using a RW and has had four falls in the past 6 months. Pt states she lives with family in a three story house where she resides on the main level with a level entry.  Pt currently with functional limitations due to the deficits listed below (see PT Problem List). She required CGA for bed mobility, modA for sit<>stand using RW, and min-modA for gait using RW. Pt is currently limited by impaired cognition, emotional lability, decreased balance, BLE weakness (R>L), and impaired activity tolerance. Pt will benefit from acute skilled PT to increase her independence and safety with mobility to allow discharge. Given the pt's CLOF, limited family support, and high fall risk recommend continued inpatient follow up therapy, <3 hours/day.    If plan is discharge home, recommend the following: A lot of help with walking and/or transfers;A lot of help with bathing/dressing/bathroom;Assistance with cooking/housework;Assist for transportation;Help with stairs or ramp for entrance   Can travel by private vehicle   Yes    Equipment Recommendations Wheelchair (measurements PT);Wheelchair cushion (measurements PT);BSC/3in1  Recommendations for Other Services       Functional Status Assessment Patient has had a recent decline in their functional status and demonstrates the ability to make significant improvements in function in a reasonable and predictable amount of time.      Precautions / Restrictions Precautions Precautions: Fall Recall of Precautions/Restrictions: Impaired Restrictions Weight Bearing Restrictions Per Provider Order: No      Mobility  Bed Mobility Overal bed mobility: Needs Assistance Bed Mobility: Supine to Sit     Supine to sit: Contact guard     General bed mobility comments: Pt sat up on L side of bed with increased time. Assist to bring BLE off EOB. Use of bed rails to elevate trunk. Pt scooted fwd with BUE support.    Transfers Overall transfer level: Needs assistance Equipment used: Rolling walker (2 wheels) Transfers: Sit to/from Stand, Bed to chair/wheelchair/BSC Sit to Stand: Mod assist   Step pivot transfers: Min assist, Mod assist       General transfer comment: Cued proper hand placement using RW. Pt utilized one up one down hand positioning. Powered up with modA. Increased time to reach upright posture. Cued BLE knee ext, hip ext, and pushing down through RW. Pt attempted to pull up on AD. She transferred to recliner chair. Fair eccentric control.    Ambulation/Gait Ambulation/Gait assistance: Min assist, Mod assist Gait Distance (Feet): 10 Feet Assistive device: Rolling walker (2 wheels) Gait Pattern/deviations: Step-to pattern, Decreased dorsiflexion - right, Decreased stride length, Trunk flexed, Leaning posteriorly Gait velocity: decr     General Gait Details: Pt ambulated with short slow steps. She lacked foot clearence on RLE and demonstrated a fwd lean. Cues for upright posture and closer proximity to RW. MinA progressing to modA for steadying due to increasing weakness. Pt with posterior and left lateral lean. Assist to manuever RW. Cues for sequencing and safety awareness.  Stairs            Wheelchair Mobility     Tilt Bed  Modified Rankin (Stroke Patients Only)       Balance       Sitting balance - Comments: Close supervision for safety. Cannot challenge. Posterior lean during  MMT.       Standing balance comment: Pt dependent on RW and external support                             Pertinent Vitals/Pain Pain Assessment Pain Assessment: Faces Faces Pain Scale: Hurts even more Pain Location: abdomen from constipation and buttocks Pain Descriptors / Indicators: Discomfort, Grimacing, Guarding, Cramping, Sharp Pain Intervention(s): Limited activity within patient's tolerance, Monitored during session, Repositioned    Home Living Family/patient expects to be discharged to:: Private residence Living Arrangements: Parent;Other relatives (Sister, nephew) Available Help at Discharge: Family;Available PRN/intermittently Type of Home: House Home Access: Level entry       Home Layout: Multi-level;Laundry or work area in basement;Able to live on main level with bedroom/bathroom Home Equipment: Agricultural Consultant (2 wheels) Additional Comments: Pt unreliable historian, need to verify. Information from Pt differs slightly from previous admission    Prior Function Prior Level of Function : Patient poor historian/Family not available;Needs assist;History of Falls (last six months)       Physical Assist : ADLs (physical)   ADLs (physical): Dressing;IADLs Mobility Comments: Using RW, endorses 4 falls in past 6 months. ADLs Comments: Sponge bathe on toilet, assist to get dressed from daughter. Assist with IADLs     Extremity/Trunk Assessment   Upper Extremity Assessment Upper Extremity Assessment: Defer to OT evaluation    Lower Extremity Assessment Lower Extremity Assessment: RLE deficits/detail;LLE deficits/detail RLE Deficits / Details: Hip flex 3-/5, Knee ext 3+/5, Knee flex 3/5, Ankle DF 2/5, Ankle PF 3+/5 RLE Sensation: history of peripheral neuropathy RLE Coordination: decreased gross motor LLE Deficits / Details: Hip flex 3/5, Knee ext 4-/5, Knee flex 3+/5, Ankle DF 3+/5, Ankle PF 3+/5 LLE Sensation: history of peripheral neuropathy LLE  Coordination: decreased gross motor    Cervical / Trunk Assessment Cervical / Trunk Assessment: Normal  Communication   Communication Communication: Impaired Factors Affecting Communication: Difficulty expressing self (verbose; communication deficits following stroke difficulty word finding occassionally)    Cognition Arousal: Alert Behavior During Therapy: Anxious, Lability   PT - Cognitive impairments: No family/caregiver present to determine baseline, Awareness, Initiation, Sequencing, Problem solving, Safety/Judgement                       PT - Cognition Comments: Pt with decreased insight into current condition. She required education and re-explaination throughout session. Pt reported don't rush me! you telling me what to do, makes me nervous, I will get to it in time. Extra time to participate. Pt frequently taking seated rest breaks or asking for a minute. Re-directed pt to focus on task. Pt alternated between pleasant/agreeable, irritated/limited engagement, and sad/teary. Offered consolment and utilized active listening strategies. Following commands: Impaired Following commands impaired: Follows one step commands with increased time     Cueing Cueing Techniques: Verbal cues, Visual cues, Gestural cues     General Comments General comments (skin integrity, edema, etc.): VSS on RA.    Exercises     Assessment/Plan    PT Assessment Patient needs continued PT services  PT Problem List Decreased strength;Decreased activity tolerance;Decreased balance;Decreased mobility;Decreased cognition;Decreased knowledge of use of DME;Decreased safety awareness       PT Treatment Interventions DME instruction;Gait training;Functional mobility training;Therapeutic activities;Therapeutic  exercise;Balance training;Patient/family education    PT Goals (Current goals can be found in the Care Plan section)  Acute Rehab PT Goals Patient Stated Goal: Regain independence and  walk further distances PT Goal Formulation: With patient Time For Goal Achievement: 07/29/24 Potential to Achieve Goals: Good    Frequency Min 2X/week     Co-evaluation               AM-PAC PT 6 Clicks Mobility  Outcome Measure Help needed turning from your back to your side while in a flat bed without using bedrails?: A Little Help needed moving from lying on your back to sitting on the side of a flat bed without using bedrails?: A Little Help needed moving to and from a bed to a chair (including a wheelchair)?: A Lot Help needed standing up from a chair using your arms (e.g., wheelchair or bedside chair)?: A Lot Help needed to walk in hospital room?: A Lot Help needed climbing 3-5 steps with a railing? : Total 6 Click Score: 13    End of Session Equipment Utilized During Treatment: Gait belt Activity Tolerance: Patient tolerated treatment well;Patient limited by fatigue Patient left: in chair;with call bell/phone within reach Nurse Communication: Mobility status;Other (comment) (lack of green alarm box present in room to plug in chair alarm) PT Visit Diagnosis: Difficulty in walking, not elsewhere classified (R26.2);Other abnormalities of gait and mobility (R26.89);Unsteadiness on feet (R26.81)    Time: 8474-8450 PT Time Calculation (min) (ACUTE ONLY): 24 min   Charges:   PT Evaluation $PT Eval Moderate Complexity: 1 Mod   PT General Charges $$ ACUTE PT VISIT: 1 Visit         Randall SAUNDERS, PT, DPT Acute Rehabilitation Services Office: [phone] Secure Chat Preferred  Delon CHRISTELLA Callander 07/15/2024, 5:12 PM

## 2024-07-15 NOTE — Plan of Care (Signed)
   Problem: Education: Goal: Knowledge of General Education information will improve Description Including pain rating scale, medication(s)/side effects and non-pharmacologic comfort measures Outcome: Progressing

## 2024-07-16 DIAGNOSIS — A419 Sepsis, unspecified organism: Secondary | ICD-10-CM | POA: Diagnosis not present

## 2024-07-16 LAB — RENAL FUNCTION PANEL
Albumin: 2.2 g/dL — ABNORMAL LOW (ref 3.5–5.0)
Anion gap: 9 (ref 5–15)
BUN: 8 mg/dL (ref 6–20)
CO2: 30 mmol/L (ref 22–32)
Calcium: 8 mg/dL — ABNORMAL LOW (ref 8.9–10.3)
Chloride: 101 mmol/L (ref 98–111)
Creatinine, Ser: 0.65 mg/dL (ref 0.44–1.00)
GFR, Estimated: >60 mL/min
Glucose, Bld: 83 mg/dL (ref 70–99)
Phosphorus: 1.4 mg/dL — ABNORMAL LOW (ref 2.5–4.6)
Potassium: 3.2 mmol/L — ABNORMAL LOW (ref 3.5–5.1)
Sodium: 139 mmol/L (ref 135–145)

## 2024-07-16 LAB — CBC
HCT: 32.5 % — ABNORMAL LOW (ref 36.0–46.0)
Hemoglobin: 11 g/dL — ABNORMAL LOW (ref 12.0–15.0)
MCH: 32.4 pg (ref 26.0–34.0)
MCHC: 33.8 g/dL (ref 30.0–36.0)
MCV: 95.6 fL (ref 80.0–100.0)
Platelets: 160 K/uL (ref 150–400)
RBC: 3.4 MIL/uL — ABNORMAL LOW (ref 3.87–5.11)
RDW: 16.7 % — ABNORMAL HIGH (ref 11.5–15.5)
WBC: 3.7 K/uL — ABNORMAL LOW (ref 4.0–10.5)
nRBC: 0 % (ref 0.0–0.2)

## 2024-07-16 LAB — CULTURE, BLOOD (ROUTINE X 2): Culture: NO GROWTH

## 2024-07-16 LAB — MAGNESIUM: Magnesium: 1.8 mg/dL (ref 1.7–2.4)

## 2024-07-16 MED ORDER — CYANOCOBALAMIN 1000 MCG/ML IJ SOLN
1000.0000 ug | Freq: Once | INTRAMUSCULAR | Status: AC
  Administered 2024-07-16: 1000 ug via INTRAMUSCULAR
  Filled 2024-07-16: qty 1

## 2024-07-16 MED ORDER — OXYCODONE HCL 5 MG PO TABS
5.0000 mg | ORAL_TABLET | ORAL | Status: DC | PRN
  Administered 2024-07-16 – 2024-07-24 (×17): 5 mg via ORAL
  Filled 2024-07-16 (×20): qty 1

## 2024-07-16 MED ORDER — POTASSIUM PHOSPHATES 15 MMOLE/5ML IV SOLN
30.0000 mmol | Freq: Once | INTRAVENOUS | Status: AC
  Administered 2024-07-16: 30 mmol via INTRAVENOUS
  Filled 2024-07-16: qty 10

## 2024-07-16 MED ORDER — ENSURE PLUS HIGH PROTEIN PO LIQD
237.0000 mL | Freq: Two times a day (BID) | ORAL | Status: DC
  Administered 2024-07-16 – 2024-07-19 (×5): 237 mL via ORAL

## 2024-07-16 MED ORDER — POTASSIUM CHLORIDE CRYS ER 20 MEQ PO TBCR
40.0000 meq | EXTENDED_RELEASE_TABLET | Freq: Once | ORAL | Status: AC
  Administered 2024-07-16: 40 meq via ORAL
  Filled 2024-07-16: qty 2

## 2024-07-16 NOTE — Progress Notes (Signed)
 Initial Nutrition Assessment  DOCUMENTATION CODES:  Not applicable  INTERVENTION:  Continue Thiamine , Folic acid , Vitamin B6, and Multivitamin w/ minerals daily Encourage good PO intake Meal ordering with assist Ensure Plus High Protein po BID, each supplement provides 350 kcal and 20 grams of protein Given low phosphorus and potassium, recommend checking labs daily until corrected  NUTRITION DIAGNOSIS:  Increased nutrient needs related to acute illness as evidenced by estimated needs.  GOAL:  Patient will meet greater than or equal to 90% of their needs  MONITOR:  PO intake, Supplement acceptance, I & O's, Labs  REASON FOR ASSESSMENT:  Consult Assessment of nutrition requirement/status  ASSESSMENT:  54 y.o. female presented to the ED with weakness and diarrhea. PMH includes EtOH abuse, vertigo, prior stroke with R-side deficits, and polyneuropathy. Pt admitted with sepsis secondary to acute cystitis.   12/27 - Admitted 12/30 - transferred to floor  RD working remotely at time of assessment. Discussed with RN, RN assisted patient with ordering breakfast this morning but was unsure how much patient truly ate. No meal intakes have been recorded at this time. RN reports that patient is confused at times.  Discussed phosphorus repletion with RN, reports that patient was initially refusing this morning but is now agreeable and about to give to patient.   Per chart review, patient with significant weight loss within the past 3 months. Patient with a 19.6% weight loss from 04/19/24, this is clinically significant for time frame.  Based on weight loss, suspect that patient meets criteria for malnutrition but unable to diagnosis at this time given RD working remotely.   Admission Weight: 72.6 kg Current Weight: 81.6 kg (12/28)  Nutrition Related Medications: Folic Acid , MVI, Potassium Chloride , Vitamin B6, Thiamine  Drips Ampicillin  Potassium Phosphate (not started) Labs: Sodium 139,  Potassium 3.2, BUN 8, Creatinine 0.65, Phosphorus 1.4, Magnesium  1.8, Vitamin B12 1699   NUTRITION - FOCUSED PHYSICAL EXAM:  Deferred to follow-up.  Diet Order:   Diet Order             Diet regular Room service appropriate? Yes with Assist; Fluid consistency: Thin  Diet effective now                  EDUCATION NEEDS:  Not appropriate for education at this time  Skin:  Skin Assessment: Reviewed RN Assessment  Last BM:  12/31 - Type 6 (small)  Height:  Ht Readings from Last 1 Encounters:  04/19/24 5' 7 (1.702 m)   Weight:  Wt Readings from Last 1 Encounters:  07/12/24 81.6 kg   Ideal Body Weight:  61.4 kg  BMI:  Body mass index is 28.18 kg/m.  Estimated Nutritional Needs:  Kcal:  2200-2400 Protein:  100-120 grams Fluid:  >/= 2 L   Nestora Glatter RD, LDN Registered Dietitian I Please see AMION for contact information

## 2024-07-16 NOTE — Progress Notes (Signed)
 Patient is refusing to take medications including IV Abx. Patient states, I want the medication I have in me to come out before I take more. This nurse explained that it is important for her to take all medications daily. Patient is insisting on take medications later. Patient needs assistance with being cleaned from bowel movement and urine. NT at bedside to assist patient with bath and linen change. NT states she doesn't need any help with patient. This nurse will return later to give medications to patient. MD attempted to speak with patient but will return due to patient care being provided by NT.

## 2024-07-16 NOTE — Plan of Care (Signed)
   Problem: Education: Goal: Knowledge of General Education information will improve Description Including pain rating scale, medication(s)/side effects and non-pharmacologic comfort measures Outcome: Progressing

## 2024-07-16 NOTE — Progress Notes (Signed)
" °  Progress Note   Patient: Veronica Gaines FMW:[id number] DOB: [birth year] DOA: 07/11/2024     4 DOS: the patient was seen and examined on 07/16/2024 at 2:07 PM      Brief hospital course: 54 y.o. 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  In the ER, found to have lactic acidosis, hypotension and proctitis.  Admitted on antibiotics then became hypotensive and so was started on Levophed  and admited to ICU.  Subsequently found to have enterococcal bacteremia.     Assessment and Plan: See summary from 12/30  Septic shock due to enterococcal bacteremia - Continue ampicillin  - DG esophagram tomorrow, then TEE on Monday - TEE pending  Acute metabolic encephalopathy Due to #3 below - Replete vitamins  Macrocytic anemia, lower extremity weakness and neuropathy due to nutritional deficiencies Vitamin B12 deficiency Vitamin B6 deficiency Folate deficiency Refused intramuscular B12 on 12/30 The right sided weakness was acute on chronic, related to the above -Continue pyridoxine , folate - Repeat intramuscular B12     Alcoholism Alcohol withdrawal, mild - Continue Librium  taper  - Consult TOC   Hypomagnesemia Hypokalemia - Supplement potassium and magnesium  - Trend Mag, BMP     Asymptomatic bacteriuria Asymptomatic               Subjective: Patient remains somewhat confused     Physical Exam: BP (!) 110/94 (BP Location: Right Arm)   Pulse 100   Temp 98.2 F (36.8 C)   Resp 18   Wt 81.6 kg   SpO2 100%   BMI 28.18 kg/m   Adult female, lying in bed, interactive and appropriate, tired, seems to have some mild cognitive impairment RRR, no murmurs, no peripheral edema Respiratory rate normal, lungs clear without rales or wheezes   Data Reviewed: Basic metabolic panel shows hypokalemia, hypophosphatemia CBC shows  anemia and thrombocytopenia    Family Communication:     Disposition: Status is: Inpatient 54 yo F admitted with fever found to have sepsis from enterococcal bacteremia.  She remains quite confused as a result of her vitamin B-12 deficiency, and is intermittently refusing cares.  She will need TEE to complete her infectious workup, and finalize plans for ampicillin , then she will go to SNF        Author: Lonni SHAUNNA Dalton, MD 07/16/2024 6:05 PM  For on call review [email or web address]    "

## 2024-07-16 NOTE — H&P (View-Only) (Signed)
" °  Progress Note   Patient: Veronica Gaines FMW:[id number] DOB: [birth year] DOA: 07/11/2024     4 DOS: the patient was seen and examined on 07/16/2024 at 2:07 PM      Brief hospital course: 54 y.o. 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  In the ER, found to have lactic acidosis, hypotension and proctitis.  Admitted on antibiotics then became hypotensive and so was started on Levophed  and admited to ICU.  Subsequently found to have enterococcal bacteremia.     Assessment and Plan: See summary from 12/30  Septic shock due to enterococcal bacteremia - Continue ampicillin  - DG esophagram tomorrow, then TEE on Monday - TEE pending  Acute metabolic encephalopathy Due to #3 below - Replete vitamins  Macrocytic anemia, lower extremity weakness and neuropathy due to nutritional deficiencies Vitamin B12 deficiency Vitamin B6 deficiency Folate deficiency Refused intramuscular B12 on 12/30 The right sided weakness was acute on chronic, related to the above -Continue pyridoxine , folate - Repeat intramuscular B12     Alcoholism Alcohol withdrawal, mild - Continue Librium  taper  - Consult TOC   Hypomagnesemia Hypokalemia - Supplement potassium and magnesium  - Trend Mag, BMP     Asymptomatic bacteriuria Asymptomatic               Subjective: Patient remains somewhat confused     Physical Exam: BP (!) 110/94 (BP Location: Right Arm)   Pulse 100   Temp 98.2 F (36.8 C)   Resp 18   Wt 81.6 kg   SpO2 100%   BMI 28.18 kg/m   Adult female, lying in bed, interactive and appropriate, tired, seems to have some mild cognitive impairment RRR, no murmurs, no peripheral edema Respiratory rate normal, lungs clear without rales or wheezes   Data Reviewed: Basic metabolic panel shows hypokalemia, hypophosphatemia CBC shows  anemia and thrombocytopenia    Family Communication:     Disposition: Status is: Inpatient 54 yo F admitted with fever found to have sepsis from enterococcal bacteremia.  She remains quite confused as a result of her vitamin B-12 deficiency, and is intermittently refusing cares.  She will need TEE to complete her infectious workup, and finalize plans for ampicillin , then she will go to SNF        Author: Lonni SHAUNNA Dalton, MD 07/16/2024 6:05 PM  For on call review [email or web address]    "

## 2024-07-16 NOTE — Plan of Care (Signed)
   Problem: Coping: Goal: Level of anxiety will decrease Outcome: Progressing   Problem: Pain Managment: Goal: General experience of comfort will improve and/or be controlled Outcome: Progressing   Problem: Safety: Goal: Ability to remain free from injury will improve Outcome: Progressing

## 2024-07-17 ENCOUNTER — Inpatient Hospital Stay (HOSPITAL_COMMUNITY)

## 2024-07-17 DIAGNOSIS — A419 Sepsis, unspecified organism: Secondary | ICD-10-CM | POA: Diagnosis not present

## 2024-07-17 LAB — BASIC METABOLIC PANEL WITH GFR
Anion gap: 9 (ref 5–15)
BUN: 6 mg/dL (ref 6–20)
CO2: 29 mmol/L (ref 22–32)
Calcium: 8 mg/dL — ABNORMAL LOW (ref 8.9–10.3)
Chloride: 101 mmol/L (ref 98–111)
Creatinine, Ser: 0.65 mg/dL (ref 0.44–1.00)
GFR, Estimated: >60 mL/min
Glucose, Bld: 73 mg/dL (ref 70–99)
Potassium: 3.2 mmol/L — ABNORMAL LOW (ref 3.5–5.1)
Sodium: 139 mmol/L (ref 135–145)

## 2024-07-17 LAB — CULTURE, BLOOD (ROUTINE X 2)
Culture: NO GROWTH
Culture: NO GROWTH
Special Requests: ADEQUATE

## 2024-07-17 LAB — CBC
HCT: 31.6 % — ABNORMAL LOW (ref 36.0–46.0)
Hemoglobin: 10.6 g/dL — ABNORMAL LOW (ref 12.0–15.0)
MCH: 31.6 pg (ref 26.0–34.0)
MCHC: 33.5 g/dL (ref 30.0–36.0)
MCV: 94.3 fL (ref 80.0–100.0)
Platelets: 176 K/uL (ref 150–400)
RBC: 3.35 MIL/uL — ABNORMAL LOW (ref 3.87–5.11)
RDW: 17 % — ABNORMAL HIGH (ref 11.5–15.5)
WBC: 4 K/uL (ref 4.0–10.5)
nRBC: 0 % (ref 0.0–0.2)

## 2024-07-17 MED ORDER — CYANOCOBALAMIN 1000 MCG/ML IJ SOLN
1000.0000 ug | Freq: Once | INTRAMUSCULAR | Status: AC
  Administered 2024-07-18: 1000 ug via INTRAMUSCULAR
  Filled 2024-07-17: qty 1

## 2024-07-17 MED ORDER — LOPERAMIDE HCL 2 MG PO CAPS
2.0000 mg | ORAL_CAPSULE | ORAL | Status: AC | PRN
  Administered 2024-07-17: 4 mg via ORAL
  Filled 2024-07-17: qty 2

## 2024-07-17 NOTE — Progress Notes (Signed)
 " Progress Note   Patient: Veronica Gaines FMW:[id number] DOB: [birth year] DOA: 07/11/2024     5 DOS: the patient was seen and examined on 07/17/2024 at 2:07 PM      Brief hospital course: 54 y.o. 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  In the ER, found to have lactic acidosis, hypotension and proctitis.  Admitted on antibiotics then became hypotensive and so was started on Levophed  and admited to ICU.  Subsequently found to have enterococcal bacteremia.     Assessment and Plan: Septic shock due to enterococcal bacteremia Admitted with tachycardia, tachypnea, hypotension, on antibiotics and pressors, found to have Enterococcus faecalis bacteremia.  Repeat cultures 12/28 NG.   - Continue ampicillin  day 6  - Consult ID - Plan for TEE Monday (TEE delayed due to SLP notation of globus sensation see below) - If esophagram today precludes TEE Monday, would recommend D/c to SNF on presumptive IE course of ampicillin      Cognitive impairment The patient's interactions do not consistently demonstrate ordered logical thinking.  She seems to have also some short-term memory impairment.  I am uncertain if this is alcohol related memory loss, or related to B12 deficiency. - See below   - Recommend serial MMSE or MoCA with PCP after discharge    Macrocytic anemia, lower extremity weakness and neuropathy due to nutritional deficiencies Vitamin B12 deficiency Vitamin B6 deficiency Folate deficiency Admitted in September for leg weakness, neuropathy.  Neurology were consulted.  Underwent MRI of the brain, CT and L-spine, that were unremarkable.   RPR, TSH normal.   Was found to have B12 deficiency, folate deficiency, and B6 deficiency, started on supplementation.  Since discharge, she reports she has not been eating, not been walking.  She started  having diarrhea and believes family members told her to stop taking vitamin B12 because this was causing the diarrhea. - Repeat IM B12 today and 1/4 then 3x weekly for one week then weekly for 4 weeks then oral - Continue pyridoxine , folate - Multivitamin - Oral thiamine  - PT/OT - Needs PCP for repeat B12, folate and B6 levels in 2 months      Right-sided weakness Resolved.  Likely acute on chronic weakness due to infection.     Alcoholism Alcohol withdrawal, mild Reports drinking 4 standard drinks per day, typically liquor.  Feels that she is having withdrawals.  No history of complicated withdrawals.  Completed Librium  taper here.     Hypomagnesemia Hypokalemia - Supplement potassium and magnesium      Asymptomatic bacteriuria No symptoms elicited by me today -Hold antibiotics for now, monitor    Globus sensation Reported to SLP earlier in hospital stay, none since.  Esophagram 1/2 limited due to patient poor cooperation, but only mild narrowing distal esophagus. - Consult SLP              Subjective: Esophagram pending today, then TEE on Monday.  Patient has no new complaints although she remains somewhat tangential and forgetful.     Physical Exam: BP 95/65 (BP Location: Left Arm)   Pulse 88   Temp 98 F (36.7 C) (Oral)   Resp 16   Wt 81.6 kg   SpO2 100%   BMI 28.18 kg/m   Adult female, lying in bed, weak and tired RRR, no murmurs, no pitting in the extremities Respiratory rate normal, lungs clear without rales or wheezes Abdomen soft, no  tenderness palpation or guarding She has continued paresthesias in both legs and both legs are weak, but strength seems to be improving.     Data Reviewed: Discussed with cardiology Basic metabolic panel and CBC unchanged    Family Communication: Daughter at the bedside    Disposition: Status is: Inpatient 54 yo F admitted with fever found to have sepsis from enterococcal bacteremia.  She remains quite  confused as a result of her vitamin B-12 deficiency, and is intermittently refusing cares.  She will need TEE to complete her infectious workup, and finalize plans for ampicillin , then she will go to SNF        Author: Lonni SHAUNNA Dalton, MD 07/17/2024 8:13 PM  For on call review [email or web address]    "

## 2024-07-17 NOTE — TOC Initial Note (Addendum)
 Transition of Care [facility]) - Initial/Assessment Note    Patient Details  Name: Veronica Gaines MRN: [id number] Date of Birth: 1970/12/10  Transition of Care [facility]) CM/SW Contact:    Jeoffrey LITTIE [name] Phone Number: 07/17/2024, 9:32 AM  Clinical Narrative:                 Pt admitted from home due to stroke. Pt currently Ox2- CSW contacted pt mother, Pat, to complete SNF workup but did not receive a response. CSW was unable to leave a voicemail. CSW will continue to follow.  2:41 PM-CSW attempted to call pt mother to complete SNF workup but did not receive a response and is still unable to leave a voicemail. CSW will continue to follow.  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, SNF Pending bed offer   Patient Goals and CMS Choice Patient states their goals for this hospitalization and ongoing recovery are:: SNF          Expected Discharge Plan and Services       Living arrangements for the past 2 months: Single Family Home                                      Prior Living Arrangements/Services Living arrangements for the past 2 months: Single Family Home   Patient language and need for interpreter reviewed:: Yes        Need for Family Participation in Patient Care: Yes (Comment) Care giver support system in place?: Yes (comment)   Criminal Activity/Legal Involvement Pertinent to Current Situation/Hospitalization: No - Comment as needed  Activities of Daily Living   ADL Screening (condition at time of admission) Independently performs ADLs?: No Does the patient have a NEW difficulty with bathing/dressing/toileting/self-feeding that is expected to last >3 days?: No Does the patient have a NEW difficulty with getting in/out of bed, walking, or climbing stairs that is expected to last >3 days?: No Does the patient have a NEW difficulty with communication that is expected to  last >3 days?: No Is the patient deaf or have difficulty hearing?: No Does the patient have difficulty seeing, even when wearing glasses/contacts?: No Does the patient have difficulty concentrating, remembering, or making decisions?: No  Permission Sought/Granted Permission sought to share information with : Family Supports    Share Information with NAME: Pat     Permission granted to share info w Relationship: Mother  Permission granted to share info w Contact Information: [phone]  Emotional Assessment Appearance:: Appears stated age Attitude/Demeanor/Rapport: Unable to Assess Affect (typically observed): Unable to Assess Orientation: : Oriented to Place, Oriented to Self Alcohol / Substance Use: Not Applicable Psych Involvement: No (comment)  Admission diagnosis:  Proctitis [K62.89] Weakness [R53.1] Sepsis (HCC) [A41.9] Urinary tract infection without hematuria, site unspecified [N39.0] Patient Active Problem List   Diagnosis Date Noted   Bacteremia 07/13/2024   Sepsis (HCC) 07/12/2024   Folate deficiency 04/15/2024   Alcohol withdrawal (HCC) 04/13/2024   Recurrent falls 04/13/2024   Neuropathy 04/12/2024   Hamstring strain, right, subsequent encounter 03/30/2021   PCP:  Patient, No Pcp Per Pharmacy:   [facility] [place] - [facility] [address], [address] [place] [address] Phone: [phone] Fax: [phone]  [id number] [place] - [address]OSS [address] [place] [address] Phone: [phone] Fax: [phone]  [facility] [address] [place] [address] Phone: [phone] Fax: [phone]     Social Drivers of Health (SDOH) Social History: SDOH Screenings   Food Insecurity: No Food Insecurity (07/12/2024)  Housing: Low Risk (07/12/2024)  Transportation Needs: No Transportation Needs (07/12/2024)   Utilities: Not At Risk (07/12/2024)  Tobacco Use: High Risk (07/14/2024)   SDOH Interventions:     Readmission Risk Interventions    04/14/2024    3:13 PM  Readmission Risk Prevention Plan  Post Dischage Appt Complete  Medication Screening Complete  Transportation Screening Complete

## 2024-07-17 NOTE — Progress Notes (Addendum)
" ° °  [facility] has been requested to perform a transesophageal echocardiogram on Veronica Gaines for bacteremia.    Relative Contraindications: Patient denies any dysphagia but speech therapy team noted some dysphagia earlier this admission. She is scheduled for a barium swallow study today.  After careful review of history and examination, the risks and benefits of transesophageal echocardiogram have been explained including risks of esophageal damage, perforation (1:10,000 risk), bleeding, pharyngeal hematoma as well as other potential complications associated with conscious sedation including aspiration, arrhythmia, respiratory failure and death. Alternatives to treatment were discussed, questions were answered.   Patient would like to discuss procedure with daughter before agreeing to proceed. Will follow-up later today. Will also need to follow-up on barium swallow study to ensure GI evaluation is not needed prior to TEE. Procedure is tentatively scheduled for Monday 07/20/2024 for 12:30pm.   Signed, Aline FORBES Door, PA-C  07/17/2024 8:23 AM   ADDENDUM:  Esophogram showed mild esophageal dysmotility with mild narrowing in the distal esophageal near the GE junction with possible small hiatal hernia. Followed back up with patient this evening and she would like to proceed with TEE on Monday as discussed earlier. Will place pre-procedural orders.  Veronica Golubski E Infantof Villagomez, PA-C 07/17/2024 6:32 PM    "

## 2024-07-17 NOTE — Progress Notes (Signed)
 PT Cancellation Note  Patient Details Name: Veronica Gaines MRN: [id number] DOB: Sep 01, 1970   Cancelled Treatment:    Reason Eval/Treat Not Completed: (P) Patient declined, no reason specified (pt c/o fatigue and eating dinner, requesting therapists attempt before noon if possible.) Will continue efforts per PT plan of care as schedule permits.   Connell HERO Haydee Jabbour 07/17/2024, 4:50 PM

## 2024-07-17 NOTE — Progress Notes (Signed)
 Noted TEE moved to Monday.    No changes from ID perspective until study is complete.   Will see back then.   Corean Fireman

## 2024-07-18 DIAGNOSIS — R5381 Other malaise: Secondary | ICD-10-CM | POA: Diagnosis not present

## 2024-07-18 DIAGNOSIS — R4189 Other symptoms and signs involving cognitive functions and awareness: Secondary | ICD-10-CM | POA: Insufficient documentation

## 2024-07-18 DIAGNOSIS — R6521 Severe sepsis with septic shock: Secondary | ICD-10-CM | POA: Diagnosis not present

## 2024-07-18 DIAGNOSIS — B952 Enterococcus as the cause of diseases classified elsewhere: Secondary | ICD-10-CM | POA: Diagnosis not present

## 2024-07-18 DIAGNOSIS — R7881 Bacteremia: Secondary | ICD-10-CM | POA: Diagnosis not present

## 2024-07-18 LAB — RENAL FUNCTION PANEL
Albumin: 2 g/dL — ABNORMAL LOW (ref 3.5–5.0)
Anion gap: 9 (ref 5–15)
BUN: 6 mg/dL (ref 6–20)
CO2: 26 mmol/L (ref 22–32)
Calcium: 7.8 mg/dL — ABNORMAL LOW (ref 8.9–10.3)
Chloride: 103 mmol/L (ref 98–111)
Creatinine, Ser: 0.74 mg/dL (ref 0.44–1.00)
GFR, Estimated: >60 mL/min
Glucose, Bld: 63 mg/dL — ABNORMAL LOW (ref 70–99)
Phosphorus: 2.4 mg/dL — ABNORMAL LOW (ref 2.5–4.6)
Potassium: 3.6 mmol/L (ref 3.5–5.1)
Sodium: 138 mmol/L (ref 135–145)

## 2024-07-18 NOTE — Progress Notes (Signed)
" °   07/18/24 1000  What Happened  Was fall witnessed? Yes  Who witnessed fall? Lelon, RN  Patients activity before fall ambulating-assisted  Point of contact buttocks  Was patient injured? No  Follow Up  Family notified No - patient refusal  Adult Fall Risk Assessment  Risk Factor Category (scoring not indicated) High fall risk per protocol (document High fall risk)  Age 54  Fall History: Fall within 6 months prior to admission 5  Elimination; Bowel and/or Urine Incontinence 0  Elimination; Bowel and/or Urine Urgency/Frequency 2  Medications: includes PCA/Opiates, Anti-convulsants, Anti-hypertensives, Diuretics, Hypnotics, Laxatives, Sedatives, and Psychotropics 3  Patient Care Equipment 1  Mobility-Assistance 2  Mobility-Gait 2  Mobility-Sensory Deficit 0  Altered awareness of immediate physical environment 0  Impulsiveness 0  Lack of understanding of one's physical/cognitive limitations 0  Total Score 15  Patient Fall Risk Level High fall risk  Adult Fall Risk Interventions  Required Bundle Interventions *See Row Information* High fall risk  Additional Interventions PT/OT need assessed if change in mobility from baseline  Fall intervention(s) refused/Patient educated regarding refusal Nonskid socks;Yellow bracelet;Bed alarm  Screening for Fall Injury Risk (To be completed on HIGH fall risk patients) - Assessing Need for Floor Mats  Risk For Fall Injury- Criteria for Floor Mats None identified - No additional interventions needed  Pain Assessment  Pain Scale 0-10  Pain Score 0    "

## 2024-07-18 NOTE — Progress Notes (Signed)
 " Progress Note    Veronica Gaines   FMW:[id number]  DOB: Dec 05, 1970  DOA: 07/11/2024     6 PCP: Patient, No Pcp Per  Initial CC: weakness  Hospital Course: 54 y.o. 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  In the ER, found to have lactic acidosis, hypotension and proctitis.  Admitted on antibiotics then became hypotensive and so was started on Levophed  and admited to ICU.  Subsequently found to have E faecalis bacteremia.  ID also consulted.  Assessment and Plan:  Septic shock due to enterococcal bacteremia - resolved  E. Faecalis bacteremia  - Admitted with tachycardia, tachypnea, hypotension, on antibiotics and pressors, found to have Enterococcus faecalis bacteremia. - Repeat cultures from 07/12/2024 remain negative -ID following, appreciate assistance - Plan for TEE on Monday    Cognitive impairment -Lower limit of normal B1 back in September 2025 as well; also folate deficiency -This may be impairment in the setting of chronic alcohol abuse given clinical history -Continue multivitamin, thiamine , folate    Macrocytic anemia, lower extremity weakness and neuropathy due to nutritional deficiencies Vitamin B12 deficiency Vitamin B6 deficiency Folate deficiency Admitted in September for leg weakness, neuropathy.  Neurology were consulted.  Underwent MRI of the brain, CT and L-spine, that were unremarkable.   RPR, TSH normal.  Was found to have B12 deficiency, folate deficiency, and B6 deficiency, started on supplementation.  Since discharge, she reports she has not been eating, not been walking.  She started having diarrhea and believes family members told her to stop taking vitamin B12 because this was causing the diarrhea. - Repeat IM B12 today and 1/4 then 3x weekly for one week then weekly for 4 weeks then oral - Continue  pyridoxine , folate - Multivitamin - Oral thiamine  - PT/OT - Needs PCP for repeat B12, folate and B6 levels in 2 months   Globus sensation Reported to SLP earlier in hospital stay, none since.  Esophagram 1/2 limited due to patient poor cooperation, but only mild narrowing distal esophagus. - Continue regular diet   Right-sided weakness Resolved.  Likely acute on chronic weakness due to infection.   Alcoholism Alcohol withdrawal, mild Reports drinking 4 standard drinks per day, typically liquor.  Feels that she is having withdrawals.  No history of complicated withdrawals. Completed Librium  taper here.   Hypomagnesemia Hypokalemia - Supplement potassium and magnesium    Asymptomatic bacteriuria No symptoms elicited by me today -Hold antibiotics for now, monitor  Interval History:  No events overnight. Very loquacious this morning with obvious cognitive impairments noted. Comfortable and brushing hair in recliner.   Antimicrobials: Zosyn  12/27 >> 12/29 Vanc 12/28 x 1 Ampicillin  12/29 >> current   Consultants:  ID  Procedures:    DVT prophylaxis:  enoxaparin  (LOVENOX ) injection 40 mg Start: 07/12/24 1000   Code Status:   Code Status: Full Code  Barriers to discharge: none  Therapy evaluation: PT Orders: Active   PT Follow up Rec: Skilled Nursing-Short Term Rehab (<3 Hours/Day)07/15/2024 1645  Disposition Plan:  SNF Status is: Inpt  Mobility Assessment (Last 72 Hours)     Mobility Assessment     Row Name 07/17/24 2300 07/17/24 1300 07/16/24 2215 07/16/24 0930 07/15/24 2030   Does the patient have exclusion criteria? No- Perform mobility assessment  redness and tenderness in groin area No- Perform mobility assessment No- Perform mobility assessment  redness and tenderness in groin area No-  Perform mobility assessment No- Perform mobility assessment   Mobility Assessment Exclusion Criteria No exclusion criteria present, perform mobility assessment No exclusion  criteria present, perform mobility assessment No exclusion criteria present, perform mobility assessment No exclusion criteria present, perform mobility assessment --   What is the highest level of mobility based on the mobility assessment? Level 3 (Stands with assistance) - Balance while standing  and cannot march in place Level 4 (Ambulates with assistance) - Balance while stepping forward/back - Complete Level 3 (Stands with assistance) - Balance while standing  and cannot march in place Level 3 (Stands with assistance) - Balance while standing  and cannot march in place Level 3 (Stands with assistance) - Balance while standing  and cannot march in place   Is the above level different from baseline mobility prior to current illness? Yes - Recommend PT order Yes - Recommend PT order Yes - Recommend PT order Yes - Recommend PT order Yes - Recommend PT order    Row Name 07/15/24 1645           What is the highest level of mobility based on the mobility assessment? Level 3 (Stands with assistance) - Balance while standing  and cannot march in place          Diet: Diet Orders (From admission, onward)     Start     Ordered   07/20/24 0001  Diet NPO time specified  Diet effective midnight        07/17/24 1832   07/17/24 1446  Diet regular Room service appropriate? Yes; Fluid consistency: Thin  Diet effective now       Question Answer Comment  Room service appropriate? Yes   Fluid consistency: Thin      07/17/24 1445            Objective: Blood pressure (!) 110/90, pulse 72, temperature (!) 97.5 F (36.4 C), temperature source Oral, resp. rate 14, weight 81.6 kg, SpO2 (!) 50%.  Examination:  Physical Exam Constitutional:      Appearance: Normal appearance.  HENT:     Head: Normocephalic and atraumatic.     Mouth/Throat:     Mouth: Mucous membranes are moist.  Eyes:     Extraocular Movements: Extraocular movements intact.  Cardiovascular:     Rate and Rhythm: Normal rate and  regular rhythm.  Pulmonary:     Effort: Pulmonary effort is normal. No respiratory distress.     Breath sounds: Normal breath sounds. No wheezing.  Abdominal:     General: Bowel sounds are normal. There is no distension.     Palpations: Abdomen is soft.     Tenderness: There is no abdominal tenderness.  Musculoskeletal:        General: Normal range of motion.     Cervical back: Normal range of motion and neck supple.  Skin:    General: Skin is warm and dry.  Neurological:     Mental Status: She is alert.     Comments: No focal deficits.  Does have slightly tangential thought pattern and rapid speech at times.  Underlying cognitive impairment and difficulty with concentration noted      Data Reviewed: Results for orders placed or performed during the hospital encounter of 07/11/24 (from the past 24 hours)  Renal function panel     Status: Abnormal   Collection Time: 07/18/24  8:18 AM  Result Value Ref Range   Sodium 138 135 - 145 mmol/L   Potassium 3.6 3.5 -  5.1 mmol/L   Chloride 103 98 - 111 mmol/L   CO2 26 22 - 32 mmol/L   Glucose, Bld 63 (L) 70 - 99 mg/dL   BUN 6 6 - 20 mg/dL   Creatinine, Ser 9.25 0.44 - 1.00 mg/dL   Calcium 7.8 (L) 8.9 - 10.3 mg/dL   Phosphorus 2.4 (L) 2.5 - 4.6 mg/dL   Albumin 2.0 (L) 3.5 - 5.0 g/dL   GFR, Estimated >39 >39 mL/min   Anion gap 9 5 - 15    I have reviewed pertinent nursing notes, vitals, labs, and images as necessary. I have ordered labwork to follow up on as indicated.  I have reviewed the last notes from staff over past 24 hours. I have discussed patient's care plan and test results with nursing staff, CM/SW, and other staff as appropriate.  Old records reviewed in assessment of this patient  Time spent: Greater than 50% of the 55 minute visit was spent in counseling/coordination of care for the patient as laid out in the A&P.   LOS: 6 days   Alm Apo, MD [facility] 07/18/2024, 2:44 PM "

## 2024-07-18 NOTE — Plan of Care (Signed)
   Problem: Education: Goal: Knowledge of General Education information will improve Description: Including pain rating scale, medication(s)/side effects and non-pharmacologic comfort measures Outcome: Progressing   Problem: Activity: Goal: Risk for activity intolerance will decrease Outcome: Progressing   Problem: Nutrition: Goal: Adequate nutrition will be maintained Outcome: Progressing   Problem: Coping: Goal: Level of anxiety will decrease Outcome: Progressing

## 2024-07-18 NOTE — Plan of Care (Signed)

## 2024-07-19 DIAGNOSIS — R6521 Severe sepsis with septic shock: Secondary | ICD-10-CM | POA: Diagnosis not present

## 2024-07-19 DIAGNOSIS — B952 Enterococcus as the cause of diseases classified elsewhere: Secondary | ICD-10-CM | POA: Diagnosis not present

## 2024-07-19 DIAGNOSIS — R7881 Bacteremia: Secondary | ICD-10-CM | POA: Diagnosis not present

## 2024-07-19 NOTE — Evaluation (Signed)
 Clinical/Bedside Swallow Evaluation Patient Details  Name: Veronica Gaines MRN: [id number] Date of Birth: [birth year]  Today's Date: 07/19/2024 Time: SLP Start Time (ACUTE ONLY): 1025 SLP Stop Time (ACUTE ONLY): 1053 SLP Time Calculation (min) (ACUTE ONLY): 28 min  Past Medical History:  Past Medical History:  Diagnosis Date   Alcohol abuse    Anemia    Polyneuropathy    Vertigo    Past Surgical History:  Past Surgical History:  Procedure Laterality Date   CESAREAN SECTION     CHOLECYSTECTOMY     HPI:  54 yo female admitted from home 07/11/24 with worsening right weakness. CTHead: No acute intracranial abnormality.ST eval, rec esophagram and esophageal precautions. Referred following procedure results. 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 While EGD imaging was limited, they did report no oropharyngeal concerns. Pt would benefit from pills in puree. GI f/u to manage symptoms of esophageal dysmotility, no continuing acute ST needs.  Pt seen upright in a chair consuming her breakfast. Facial swelling was reduced, pt reports difficulty with right sided mastication (pt reported she was scheduled foe an extraction while admitted at [facility] and had to cancel). Pt was observed swallowing a pill with thin water with no overt s/s of aspiration and no regurgitation but reported that would have been easier with apple sauce. She had some oral holding with thins but consumed thins and solids with minimal reports of globus- no subtle or overt s/s of aspiration observed. However, she reports that she has in the past had severe stasis but that she also used to consumed food lying down in bed for several years. Pt given printed handout that outlines  diet recommendations, environmental/lifestyle changes for esophageal precautions. She verbally acknowledged understanding and reports she has made changes previously to her diet (eliminating red meat per pt report). Pt does not present as an aspiration risk given eophagram reports and clinical observation, recommend GI continue to follow as directed and continued reg/thin diet with general safe swallow and esophageal precautions. SLP signing off, please reconsult if further needs arise.   SLP Visit Diagnosis: Dysphagia, unspecified (R13.10)    Aspiration Risk  No limitations    Diet Recommendation           Other Recommendations Oral Care Recommendations: Oral care BID     Swallow Evaluation Recommendations Recommendations: PO diet PO Diet Recommendation: Regular;Thin liquids (Level 0) Medication Administration: Whole meds with puree Supervision: Patient able to self-feed Swallowing strategies  : Slow rate;Small bites/sips;Follow solids with liquids Postural changes: Position pt fully upright for meals;Out of bed for meals;Stay upright 30-60 min after meals Oral care recommendations: Oral care BID (2x/day) Recommended consults: Consider esophageal assessment;Other(comment) (another esophagram when able to be more mobile?)   Assistance Recommended at Discharge    Functional Status Assessment Patient has had a recent decline in their functional status and demonstrates the ability to make significant improvements in function in a reasonable and predictable amount of time.  Frequency and Duration            Prognosis Prognosis for improved oropharyngeal function: Good      Swallow Study   General Date of Onset: 07/11/24 HPI: 54 yo female admitted from home 07/11/24 with worsening right weakness. CTHead: No acute intracranial abnormality.ST  eval, rec esophagram and esophageal precautions. Referred following procedure results. 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,  alcohol abuse, recurrent falls Type of Study: Bedside Swallow Evaluation Previous Swallow Assessment: BSE Diet Prior to this Study: Regular;Thin liquids (Level 0) Temperature Spikes Noted: No Respiratory Status: Room air History of Recent Intubation: No Behavior/Cognition: Alert;Requires cueing;Cooperative Oral Care Completed by SLP: No (pt eating breakfast uopn arrival) Oral Cavity - Dentition: Poor condition;Missing dentition Vision: Functional for self-feeding Self-Feeding Abilities: Able to feed self Patient Positioning: Upright in chair Baseline Vocal Quality: Normal (pt reports that her fluency is baseline this stuttering came after the stroke I guess) Volitional Swallow: Able to elicit    Oral/Motor/Sensory Function Overall Oral Motor/Sensory Function: Other (comment) (normal strength/ROM, reduced swelling from previous BSE)   Ice Chips Ice chips: Not tested   Thin Liquid Thin Liquid: Within functional limits    Nectar Thick Nectar Thick Liquid: Not tested   Honey Thick Honey Thick Liquid: Not tested   Puree Puree: Not tested   Solid     Solid: Within functional limits     Manuelita Blew M.S. CCC-SLP

## 2024-07-19 NOTE — Progress Notes (Signed)
 Mobility Specialist: Progress Note   07/19/24 1300  Mobility  Activity Ambulated with assistance  Level of Assistance Moderate assist, patient does 50-74%  Assistive Device Front wheel walker  Distance Ambulated (ft) 20 ft (10' x2)  Activity Response Tolerated well  Mobility Referral Yes  Mobility visit 1 Mobility  Mobility Specialist Start Time (ACUTE ONLY) 1240  Mobility Specialist Stop Time (ACUTE ONLY) 1320  Mobility Specialist Time Calculation (min) (ACUTE ONLY) 40 min    Pt received in chair, pleasant and agreeable to mobility session. Very anxious about ambulation, c/o R knee pain and LE weakness.  ModA for STS from chair. MinA physical assist needed for ambulation to steady, but a few occurrences of R knee buckling requiring light modA to correct Took a seated rest in between bouts d/t fatigue, SOB, and LE weakness. SpO2 93-97% on RA throughout. Returned to chair at [place]. Very grateful and feeling more comfortable with mobility. Left in chair with all needs met, call bell in reach.   Ileana Lute Mobility Specialist Please contact via SecureChat or Rehab office at [phone]

## 2024-07-19 NOTE — Progress Notes (Signed)
 " Progress Note    Veronica Gaines   FMW:[id number]  DOB: 1971/06/29  DOA: 07/11/2024     7 PCP: Patient, No Pcp Per  Initial CC: weakness  Hospital Course: 54 y.o. 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  In the ER, found to have lactic acidosis, hypotension and proctitis.  Admitted on antibiotics then became hypotensive and so was started on Levophed  and admited to ICU.  Subsequently found to have E faecalis bacteremia.  ID also consulted.  Assessment and Plan:  Septic shock due to enterococcal bacteremia - resolved  E. Faecalis bacteremia  - Admitted with tachycardia, tachypnea, hypotension, on antibiotics and pressors, found to have Enterococcus faecalis bacteremia. - Repeat cultures from 07/12/2024 remain negative -ID following, appreciate assistance - Plan for TEE on Monday    Cognitive impairment -Lower limit of normal B1 back in September 2025 as well; also folate deficiency -This may be impairment in the setting of chronic alcohol abuse given clinical history -Continue multivitamin, thiamine , folate    Macrocytic anemia, lower extremity weakness and neuropathy due to nutritional deficiencies Vitamin B12 deficiency Vitamin B6 deficiency Folate deficiency Admitted in September for leg weakness, neuropathy.  Neurology were consulted.  Underwent MRI of the brain, CT and L-spine, that were unremarkable.   RPR, TSH normal.  Was found to have B12 deficiency, folate deficiency, and B6 deficiency, started on supplementation.  Since discharge, she reports she has not been eating, not been walking.  She started having diarrhea and believes family members told her to stop taking vitamin B12 because this was causing the diarrhea. - Repeat IM B12 today and 1/4 then 3x weekly for one week then weekly for 4 weeks then oral - Continue  pyridoxine , folate - Multivitamin - Oral thiamine  - PT/OT - Needs PCP for repeat B12, folate and B6 levels in 2 months   Globus sensation Reported to SLP earlier in hospital stay, none since.  Esophagram 1/2 limited due to patient poor cooperation, but only mild narrowing distal esophagus. - Continue regular diet   Right-sided weakness Resolved.  Likely acute on chronic weakness due to infection.   Alcoholism Alcohol withdrawal, mild Reports drinking 4 standard drinks per day, typically liquor.  Feels that she is having withdrawals.  No history of complicated withdrawals. Completed Librium  taper here.   Hypomagnesemia Hypokalemia - Supplement potassium and magnesium    Asymptomatic bacteriuria No symptoms elicited by me today -Hold antibiotics for now, monitor  Interval History:  No events overnight.  Sitting in recliner this morning. Doing okay with no concerns. TEE planned for tomorrow.  Antimicrobials: Zosyn  12/27 >> 12/29 Vanc 12/28 x 1 Ampicillin  12/29 >> current   Consultants:  ID  Procedures:    DVT prophylaxis:  enoxaparin  (LOVENOX ) injection 40 mg Start: 07/12/24 1000   Code Status:   Code Status: Full Code  Barriers to discharge: none  Therapy evaluation: PT Orders: Active   PT Follow up Rec: Skilled Nursing-Short Term Rehab (<3 Hours/Day)07/15/2024 1645  Disposition Plan:  SNF Status is: Inpt  Mobility Assessment (Last 72 Hours)     Mobility Assessment     Row Name 07/19/24 1007 07/18/24 2207 07/18/24 1116 07/17/24 2300 07/17/24 1300   Does the patient have exclusion criteria? No- Perform mobility assessment No- Perform mobility assessment No- Perform mobility assessment No- Perform mobility assessment  redness and tenderness in groin area No- Perform mobility assessment  Mobility Assessment Exclusion Criteria No exclusion criteria present, perform mobility assessment No exclusion criteria present, perform mobility assessment No exclusion  criteria present, perform mobility assessment No exclusion criteria present, perform mobility assessment No exclusion criteria present, perform mobility assessment   What is the highest level of mobility based on the mobility assessment? Level 4 (Ambulates with assistance) - Balance while stepping forward/back - Complete Level 3 (Stands with assistance) - Balance while standing  and cannot march in place Level 3 (Stands with assistance) - Balance while standing  and cannot march in place Level 3 (Stands with assistance) - Balance while standing  and cannot march in place Level 4 (Ambulates with assistance) - Balance while stepping forward/back - Complete   Is the above level different from baseline mobility prior to current illness? Yes - Recommend PT order Yes - Recommend PT order Yes - Recommend PT order Yes - Recommend PT order Yes - Recommend PT order    Row Name 07/16/24 2215           Does the patient have exclusion criteria? No- Perform mobility assessment  redness and tenderness in groin area       Mobility Assessment Exclusion Criteria No exclusion criteria present, perform mobility assessment       What is the highest level of mobility based on the mobility assessment? Level 3 (Stands with assistance) - Balance while standing  and cannot march in place       Is the above level different from baseline mobility prior to current illness? Yes - Recommend PT order          Diet: Diet Orders (From admission, onward)     Start     Ordered   07/20/24 0001  Diet NPO time specified  Diet effective midnight        07/17/24 1832   07/17/24 1446  Diet regular Room service appropriate? Yes; Fluid consistency: Thin  Diet effective now       Question Answer Comment  Room service appropriate? Yes   Fluid consistency: Thin      07/17/24 1445            Objective: Blood pressure (!) 114/93, pulse 87, temperature (!) 97.5 F (36.4 C), temperature source Oral, resp. rate 15, weight 81.6 kg,  SpO2 100%.  Examination:  Physical Exam Constitutional:      Appearance: Normal appearance.  HENT:     Head: Normocephalic and atraumatic.     Mouth/Throat:     Mouth: Mucous membranes are moist.  Eyes:     Extraocular Movements: Extraocular movements intact.  Cardiovascular:     Rate and Rhythm: Normal rate and regular rhythm.  Pulmonary:     Effort: Pulmonary effort is normal. No respiratory distress.     Breath sounds: Normal breath sounds. No wheezing.  Abdominal:     General: Bowel sounds are normal. There is no distension.     Palpations: Abdomen is soft.     Tenderness: There is no abdominal tenderness.  Musculoskeletal:        General: Normal range of motion.     Cervical back: Normal range of motion and neck supple.  Skin:    General: Skin is warm and dry.  Neurological:     Mental Status: She is alert.     Comments: No focal deficits.  Does have slightly tangential thought pattern and rapid speech at times.  Underlying cognitive impairment and difficulty with concentration noted  Data Reviewed: No results found for this or any previous visit (from the past 24 hours).   I have reviewed pertinent nursing notes, vitals, labs, and images as necessary. I have ordered labwork to follow up on as indicated.  I have reviewed the last notes from staff over past 24 hours. I have discussed patient's care plan and test results with nursing staff, CM/SW, and other staff as appropriate.  Old records reviewed in assessment of this patient  Time spent: Greater than 50% of the 55 minute visit was spent in counseling/coordination of care for the patient as laid out in the A&P.   LOS: 7 days   Alm Apo, MD [facility] 07/19/2024, 1:24 PM "

## 2024-07-19 NOTE — Plan of Care (Signed)
  Problem: Education: Goal: Knowledge of General Education information will improve Description: Including pain rating scale, medication(s)/side effects and non-pharmacologic comfort measures Outcome: Progressing   Problem: Elimination: Goal: Will not experience complications related to bowel motility Outcome: Progressing   Problem: Pain Managment: Goal: General experience of comfort will improve and/or be controlled Outcome: Progressing   Problem: Safety: Goal: Ability to remain free from injury will improve Outcome: Progressing   Problem: Skin Integrity: Goal: Risk for impaired skin integrity will decrease Outcome: Progressing

## 2024-07-20 ENCOUNTER — Telehealth (HOSPITAL_COMMUNITY): Payer: Self-pay

## 2024-07-20 ENCOUNTER — Other Ambulatory Visit (HOSPITAL_COMMUNITY): Payer: Self-pay

## 2024-07-20 ENCOUNTER — Inpatient Hospital Stay (HOSPITAL_COMMUNITY)

## 2024-07-20 ENCOUNTER — Other Ambulatory Visit: Payer: Self-pay

## 2024-07-20 ENCOUNTER — Encounter (HOSPITAL_COMMUNITY)
Admission: EM | Disposition: A | Discharge status: Skilled Nursing Facility | Payer: Self-pay | Source: Home / Self Care | Attending: Internal Medicine

## 2024-07-20 ENCOUNTER — Encounter (HOSPITAL_COMMUNITY): Payer: Self-pay | Admitting: Internal Medicine

## 2024-07-20 ENCOUNTER — Inpatient Hospital Stay (HOSPITAL_COMMUNITY): Payer: Self-pay

## 2024-07-20 DIAGNOSIS — R197 Diarrhea, unspecified: Secondary | ICD-10-CM | POA: Diagnosis not present

## 2024-07-20 DIAGNOSIS — R7881 Bacteremia: Secondary | ICD-10-CM | POA: Diagnosis not present

## 2024-07-20 DIAGNOSIS — B952 Enterococcus as the cause of diseases classified elsewhere: Secondary | ICD-10-CM | POA: Diagnosis not present

## 2024-07-20 DIAGNOSIS — R6521 Severe sepsis with septic shock: Secondary | ICD-10-CM | POA: Diagnosis not present

## 2024-07-20 DIAGNOSIS — I339 Acute and subacute endocarditis, unspecified: Secondary | ICD-10-CM | POA: Diagnosis not present

## 2024-07-20 DIAGNOSIS — R4189 Other symptoms and signs involving cognitive functions and awareness: Secondary | ICD-10-CM | POA: Diagnosis not present

## 2024-07-20 HISTORY — PX: TRANSESOPHAGEAL ECHOCARDIOGRAM (CATH LAB): EP1270

## 2024-07-20 LAB — ECHO TEE
AV Mean grad: 3 mmHg
AV Peak grad: 5.7 mmHg
Ao pk vel: 1.19 m/s

## 2024-07-20 LAB — BASIC METABOLIC PANEL WITH GFR
Anion gap: 9 (ref 5–15)
BUN: 9 mg/dL (ref 6–20)
CO2: 28 mmol/L (ref 22–32)
Calcium: 8.1 mg/dL — ABNORMAL LOW (ref 8.9–10.3)
Chloride: 102 mmol/L (ref 98–111)
Creatinine, Ser: 0.86 mg/dL (ref 0.44–1.00)
GFR, Estimated: >60 mL/min
Glucose, Bld: 62 mg/dL — ABNORMAL LOW (ref 70–99)
Potassium: 3.3 mmol/L — ABNORMAL LOW (ref 3.5–5.1)
Sodium: 140 mmol/L (ref 135–145)

## 2024-07-20 LAB — CBC WITH DIFFERENTIAL/PLATELET
Abs Immature Granulocytes: 0.03 K/uL (ref 0.00–0.07)
Basophils Absolute: 0 K/uL (ref 0.0–0.1)
Basophils Relative: 1 %
Eosinophils Absolute: 0 K/uL (ref 0.0–0.5)
Eosinophils Relative: 1 %
HCT: 35.5 % — ABNORMAL LOW (ref 36.0–46.0)
Hemoglobin: 11.8 g/dL — ABNORMAL LOW (ref 12.0–15.0)
Immature Granulocytes: 1 %
Lymphocytes Relative: 43 %
Lymphs Abs: 2.2 K/uL (ref 0.7–4.0)
MCH: 31.5 pg (ref 26.0–34.0)
MCHC: 33.2 g/dL (ref 30.0–36.0)
MCV: 94.7 fL (ref 80.0–100.0)
Monocytes Absolute: 0.6 K/uL (ref 0.1–1.0)
Monocytes Relative: 12 %
Neutro Abs: 2.1 K/uL (ref 1.7–7.7)
Neutrophils Relative %: 42 %
Platelets: 208 K/uL (ref 150–400)
RBC: 3.75 MIL/uL — ABNORMAL LOW (ref 3.87–5.11)
RDW: 17.2 % — ABNORMAL HIGH (ref 11.5–15.5)
WBC: 5 K/uL (ref 4.0–10.5)
nRBC: 0 % (ref 0.0–0.2)

## 2024-07-20 LAB — MAGNESIUM: Magnesium: 1.6 mg/dL — ABNORMAL LOW (ref 1.7–2.4)

## 2024-07-20 SURGERY — TRANSESOPHAGEAL ECHOCARDIOGRAM (TEE) (CATHLAB)
Anesthesia: Monitor Anesthesia Care

## 2024-07-20 MED ORDER — POTASSIUM CHLORIDE 20 MEQ PO PACK
40.0000 meq | PACK | ORAL | Status: AC
  Administered 2024-07-20 (×2): 40 meq via ORAL
  Filled 2024-07-20 (×2): qty 2

## 2024-07-20 MED ORDER — MAGNESIUM SULFATE 4 GM/100ML IV SOLN
4.0000 g | Freq: Once | INTRAVENOUS | Status: AC
  Administered 2024-07-20: 4 g via INTRAVENOUS
  Filled 2024-07-20: qty 100

## 2024-07-20 MED ORDER — VITAMIN B-12 1000 MCG PO TABS
1000.0000 ug | ORAL_TABLET | Freq: Every day | ORAL | Status: DC
  Administered 2024-07-21 – 2024-07-24 (×4): 1000 ug via ORAL
  Filled 2024-07-20 (×4): qty 1

## 2024-07-20 MED ORDER — LINEZOLID 600 MG PO TABS
600.0000 mg | ORAL_TABLET | Freq: Two times a day (BID) | ORAL | Status: DC
  Administered 2024-07-20 – 2024-07-24 (×9): 600 mg via ORAL
  Filled 2024-07-20 (×9): qty 1

## 2024-07-20 MED ORDER — SODIUM CHLORIDE 0.9 % IV SOLN: INTRAVENOUS | Status: DC | PRN

## 2024-07-20 MED ORDER — LIDOCAINE 2% (20 MG/ML) 5 ML SYRINGE
INTRAMUSCULAR | Status: DC | PRN
  Administered 2024-07-20: 100 mg via INTRAVENOUS

## 2024-07-20 MED ORDER — PROPOFOL 10 MG/ML IV BOLUS
INTRAVENOUS | Status: DC | PRN
  Administered 2024-07-20: 50 mg via INTRAVENOUS
  Administered 2024-07-20: 70 mg via INTRAVENOUS
  Administered 2024-07-20: 30 mg via INTRAVENOUS
  Administered 2024-07-20: 50 mg via INTRAVENOUS

## 2024-07-20 NOTE — Progress Notes (Signed)
 "   [facility]  Date of Admission:  07/11/2024     Reason for Follow Up: Septic shock (HCC)  Total days of antibiotics 10         ASSESSMENT:  Veronica Gaines is a 54 year old female admitted with diarrhea, weakness, and found to have enterococcal bacteremia.  Blood cultures from 07/12/2024 finalized without growth indicating clearance of bacteremia.  Echocardiogram completed today without evidence of vegetation or endocarditis.  Tolerating antibiotics with no adverse side effects.  Discussed plan of care for 1 week of linezolid  600 mg p.o. twice daily.  Okay for discharge from ID standpoint.  No additional follow-up needed.  Remaining medical and supportive care per internal medicine.    PLAN:  Change antibiotics to linezolid  600 mg p.o. twice daily for 1 week. Okay for discharge from ID standpoint with no additional follow-up needed. Remaining medical and supportive care per internal medicine.  ID will sign off.  Please reconsult if needed.  Active Problems:   Bacteremia due to Enterococcus   Cognitive impairment    aspirin  EC  81 mg Oral Daily   [START ON 07/21/2024] vitamin B-12  1,000 mcg Oral Daily   enoxaparin  (LOVENOX ) injection  40 mg Subcutaneous Daily   feeding supplement  237 mL Oral BID BM   folic acid   1 mg Oral Daily   linezolid   600 mg Oral Q12H   liver oil-zinc  oxide   Topical q12n4p   multivitamin with minerals  1 tablet Oral Daily   nicotine   21 mg Transdermal Daily   potassium chloride   40 mEq Oral Q4H   pyridOXINE   50 mg Oral Daily   thiamine   100 mg Oral Daily    SUBJECTIVE:  Afebrile overnight with no acute events. Tolerating antibiotics with no adverse side effects.   Allergies[1]   Review of Systems: Review of Systems  Constitutional:  Negative for chills, fever and weight loss.  Respiratory:  Negative for cough, shortness of breath and wheezing.   Cardiovascular:  Negative for chest pain and leg swelling.   Gastrointestinal:  Negative for abdominal pain, constipation, diarrhea, nausea and vomiting.  Skin:  Negative for rash.      OBJECTIVE: Vitals:   07/20/24 0810 07/20/24 0906 07/20/24 0920 07/20/24 1005  BP:  93/71 106/77 112/87  Pulse: 86 87  84  Resp: 12 16 14 16   Temp:    (!) 97.3 F (36.3 C)  TempSrc:    Oral  SpO2: 100% 99% 100% 100%  Weight:       Body mass index is 28.18 kg/m.  Physical Exam Constitutional:      General: She is not in acute distress.    Appearance: She is well-developed.  Cardiovascular:     Rate and Rhythm: Normal rate and regular rhythm.     Heart sounds: Normal heart sounds.  Pulmonary:     Effort: Pulmonary effort is normal.     Breath sounds: Normal breath sounds.  Skin:    General: Skin is warm and dry.  Neurological:     Mental Status: She is alert.     Lab Results Lab Results  Component Value Date   WBC 5.0 07/20/2024   HGB 11.8 (L) 07/20/2024   HCT 35.5 (L) 07/20/2024   MCV 94.7 07/20/2024   PLT 208 07/20/2024    Lab Results  Component Value Date   CREATININE 0.86 07/20/2024   BUN 9 07/20/2024   NA 140 07/20/2024   K 3.3 (L)  07/20/2024   CL 102 07/20/2024   CO2 28 07/20/2024    Lab Results  Component Value Date   ALT 13 07/15/2024   AST 25 07/15/2024   ALKPHOS 90 07/15/2024   BILITOT 0.2 07/15/2024     Microbiology: Recent Results (from the past 240 hours)  Blood culture (routine x 2)     Status: Abnormal   Collection Time: 07/11/24  9:29 PM   Specimen: BLOOD  Result Value Ref Range Status   Specimen Description BLOOD SITE NOT SPECIFIED  Final   Special Requests   Final    BOTTLES DRAWN AEROBIC AND ANAEROBIC Blood Culture adequate volume   Culture  Setup Time   Final    GRAM POSITIVE COCCI AEROBIC BOTTLE ONLY CRITICAL RESULT CALLED TO, READ BACK BY AND VERIFIED WITH: PHARMD Larraine Redo on [id number] @1840  by SM Performed at [facility], [address] [place], [place] [address]    Culture  ENTEROCOCCUS FAECALIS (A)  Final   Report Status 07/14/2024 FINAL  Final   Organism ID, Bacteria ENTEROCOCCUS FAECALIS  Final      Susceptibility   Enterococcus faecalis - MIC*    AMPICILLIN  <=2 SENSITIVE Sensitive     VANCOMYCIN  1 SENSITIVE Sensitive     GENTAMICIN SYNERGY SENSITIVE Sensitive     * ENTEROCOCCUS FAECALIS  Blood Culture ID Panel (Reflexed)     Status: Abnormal   Collection Time: 07/11/24  9:29 PM  Result Value Ref Range Status   Enterococcus faecalis DETECTED (A) NOT DETECTED Final    Comment: CRITICAL RESULT CALLED TO, READ BACK BY AND VERIFIED WITH: PHARMD Larraine Redo on [id number] @1840  by SM    Enterococcus Faecium NOT DETECTED NOT DETECTED Final   Listeria monocytogenes NOT DETECTED NOT DETECTED Final   Staphylococcus species NOT DETECTED NOT DETECTED Final   Staphylococcus aureus (BCID) NOT DETECTED NOT DETECTED Final   Staphylococcus epidermidis NOT DETECTED NOT DETECTED Final   Staphylococcus lugdunensis NOT DETECTED NOT DETECTED Final   Streptococcus species NOT DETECTED NOT DETECTED Final   Streptococcus agalactiae NOT DETECTED NOT DETECTED Final   Streptococcus pneumoniae NOT DETECTED NOT DETECTED Final   Streptococcus pyogenes NOT DETECTED NOT DETECTED Final   A.calcoaceticus-baumannii NOT DETECTED NOT DETECTED Final   Bacteroides fragilis NOT DETECTED NOT DETECTED Final   Enterobacterales NOT DETECTED NOT DETECTED Final   Enterobacter cloacae complex NOT DETECTED NOT DETECTED Final   Escherichia coli NOT DETECTED NOT DETECTED Final   Klebsiella aerogenes NOT DETECTED NOT DETECTED Final   Klebsiella oxytoca NOT DETECTED NOT DETECTED Final   Klebsiella pneumoniae NOT DETECTED NOT DETECTED Final   Proteus species NOT DETECTED NOT DETECTED Final   Salmonella species NOT DETECTED NOT DETECTED Final   Serratia marcescens NOT DETECTED NOT DETECTED Final   Haemophilus influenzae NOT DETECTED NOT DETECTED Final   Neisseria meningitidis NOT DETECTED NOT  DETECTED Final   Pseudomonas aeruginosa NOT DETECTED NOT DETECTED Final   Stenotrophomonas maltophilia NOT DETECTED NOT DETECTED Final   Candida albicans NOT DETECTED NOT DETECTED Final   Candida auris NOT DETECTED NOT DETECTED Final   Candida glabrata NOT DETECTED NOT DETECTED Final   Candida krusei NOT DETECTED NOT DETECTED Final   Candida parapsilosis NOT DETECTED NOT DETECTED Final   Candida tropicalis NOT DETECTED NOT DETECTED Final   Cryptococcus neoformans/gattii NOT DETECTED NOT DETECTED Final   Vancomycin  resistance NOT DETECTED NOT DETECTED Final    Comment: Performed at [facility], [address] [place],  [place] [address]  Blood culture (routine x 2)     Status: None   Collection Time: 07/11/24  9:34 PM   Specimen: BLOOD  Result Value Ref Range Status   Specimen Description BLOOD SITE NOT SPECIFIED  Final   Special Requests   Final    BOTTLES DRAWN AEROBIC AND ANAEROBIC Blood Culture results may not be optimal due to an inadequate volume of blood received in culture bottles   Culture   Final    NO GROWTH 5 DAYS Performed at [facility], [address], [place], [place] [address]    Report Status 07/16/2024 FINAL  Final  Urine Culture (for pregnant, neutropenic or urologic patients or patients with an indwelling urinary catheter)     Status: Abnormal   Collection Time: 07/12/24  6:09 PM   Specimen: Urine, Clean Catch  Result Value Ref Range Status   Specimen Description URINE, CLEAN CATCH  Final   Special Requests   Final    NONE Performed at [facility], [address] [place], [place] [address]    Culture >=100,000 COLONIES/mL PSEUDOMONAS AERUGINOSA (A)  Final   Report Status 07/15/2024 FINAL  Final   Organism ID, Bacteria PSEUDOMONAS AERUGINOSA (A)  Final      Susceptibility   Pseudomonas aeruginosa - MIC*    MEROPENEM 1 SENSITIVE Sensitive     CIPROFLOXACIN 0.5 SENSITIVE Sensitive     IMIPENEM 1 SENSITIVE Sensitive     PIP/TAZO Value in  next row Sensitive      16 SENSITIVEThis is a modified FDA-approved test that has been validated and its performance characteristics determined by the reporting laboratory.  This laboratory is certified under the Clinical Laboratory Improvement Amendments CLIA as qualified to perform high complexity clinical laboratory testing.    CEFEPIME Value in next row Sensitive      16 SENSITIVEThis is a modified FDA-approved test that has been validated and its performance characteristics determined by the reporting laboratory.  This laboratory is certified under the Clinical Laboratory Improvement Amendments CLIA as qualified to perform high complexity clinical laboratory testing.    CEFTAZIDIME/AVIBACTAM Value in next row Sensitive      16 SENSITIVEThis is a modified FDA-approved test that has been validated and its performance characteristics determined by the reporting laboratory.  This laboratory is certified under the Clinical Laboratory Improvement Amendments CLIA as qualified to perform high complexity clinical laboratory testing.    CEFTOLOZANE/TAZOBACTAM Value in next row Sensitive      16 SENSITIVEThis is a modified FDA-approved test that has been validated and its performance characteristics determined by the reporting laboratory.  This laboratory is certified under the Clinical Laboratory Improvement Amendments CLIA as qualified to perform high complexity clinical laboratory testing.    TOBRAMYCIN Value in next row Sensitive      16 SENSITIVEThis is a modified FDA-approved test that has been validated and its performance characteristics determined by the reporting laboratory.  This laboratory is certified under the Clinical Laboratory Improvement Amendments CLIA as qualified to perform high complexity clinical laboratory testing.    CEFTAZIDIME Value in next row Sensitive      16 SENSITIVEThis is a modified FDA-approved test that has been validated and its performance characteristics determined by the  reporting laboratory.  This laboratory is certified under the Clinical Laboratory Improvement Amendments CLIA as qualified to perform high complexity clinical laboratory testing.    * >=100,000 COLONIES/mL PSEUDOMONAS AERUGINOSA  Culture, blood (Routine X 2) w Reflex to ID  Panel     Status: None   Collection Time: 07/12/24  8:29 PM   Specimen: BLOOD  Result Value Ref Range Status   Specimen Description BLOOD BLOOD LEFT HAND  Final   Special Requests   Final    BOTTLES DRAWN AEROBIC AND ANAEROBIC Blood Culture adequate volume   Culture   Final    NO GROWTH 5 DAYS Performed at [facility], [address] [place], [place] [address]    Report Status 07/17/2024 FINAL  Final  Culture, blood (Routine X 2) w Reflex to ID Panel     Status: None   Collection Time: 07/12/24  8:29 PM   Specimen: BLOOD  Result Value Ref Range Status   Specimen Description BLOOD BLOOD RIGHT ARM  Final   Special Requests   Final    BOTTLES DRAWN AEROBIC AND ANAEROBIC Blood Culture results may not be optimal due to an inadequate volume of blood received in culture bottles   Culture   Final    NO GROWTH 5 DAYS Performed at [facility], [address], [place], [place] [address]    Report Status 07/17/2024 FINAL  Final     Cathlyn July, NP [facility]  07/20/2024  4:17 PM     [1] No Known Allergies  "

## 2024-07-20 NOTE — Anesthesia Postprocedure Evaluation (Signed)
"   Anesthesia Post Note  Patient: Veronica Gaines  Procedure(s) Performed: TRANSESOPHAGEAL ECHOCARDIOGRAM     Patient location during evaluation: Cath Lab Anesthesia Type: MAC Level of consciousness: awake Pain management: pain level controlled Vital Signs Assessment: post-procedure vital signs reviewed and stable Respiratory status: spontaneous breathing, nonlabored ventilation and respiratory function stable Cardiovascular status: blood pressure returned to baseline and stable Postop Assessment: no apparent nausea or vomiting Anesthetic complications: no   There were no known notable events for this encounter.  Last Vitals:  Vitals:   07/20/24 1005 07/20/24 1727  BP: 112/87 93/79  Pulse: 84 94  Resp: 16 17  Temp: (!) 36.3 C 36.6 C  SpO2: 100% 100%    Last Pain:  Vitals:   07/20/24 1727  TempSrc: Oral  PainSc:                  Lexine Jaspers P Josue Kass      "

## 2024-07-20 NOTE — CV Procedure (Signed)
" ° ° °  TRANSESOPHAGEAL ECHOCARDIOGRAM   NAME:  Veronica Gaines    MRN: [id number] DOB:  10/20/1970    ADMIT DATE: 07/11/2024  INDICATIONS: Cognitive changes; Rule out Infective endocarditis  PROCEDURE:   Informed consent was obtained prior to the procedure. The risks, benefits and alternatives for the procedure were discussed and the patient comprehended these risks.  Risks include, but are not limited to, cough, sore throat, vomiting, nausea, somnolence, esophageal and stomach trauma or perforation, bleeding, low blood pressure, aspiration, pneumonia, infection, trauma to the teeth and death.    Procedural time out performed. Anesthesia was administered by Dr. Dallie or team.    The patient's heart rate, blood pressure, and oxygen saturation are monitored continuously during the procedure.  The transesophageal probe was inserted in the esophagus and stomach without difficulty and multiple views were obtained.   COMPLICATIONS:    There were no immediate complications.  KEY FINDINGS:  LVEF 55%. No vegetations. Negative bubble study.  Full report to follow. Further management per primary team.   Stanly Leavens, MD [place]  [facility]  9:05 AM   "

## 2024-07-20 NOTE — Telephone Encounter (Signed)
 Pharmacy Patient Advocate Encounter  Insurance verification completed.    The patient is insured through [employer].     Ran test claim for Linezolid  600mg  tablet and the current 30 day co-pay is $4.   This test claim was processed through [employer]- copay amounts may vary at other pharmacies due to [employer], or as the patient moves through the different stages of their insurance plan.

## 2024-07-20 NOTE — NC FL2 (Signed)
 " [place]  MEDICAID FL2 LEVEL OF CARE FORM     IDENTIFICATION  Patient Name: Veronica Gaines Birthdate: 06-16-1971 Sex: female Admission Date (Current Location): 07/11/2024  [facility] and [place] Number:  Producer, Television/film/video and Address:  [facility], [address] [place], [place] [address]      Provider Number: [id number]  Attending Physician Name and Address:  Patsy Lenis, MD  Relative Name and Phone Number:  Jhanvi Drakeford (Daughter) [phone]    Current Level of Care: Hospital Recommended Level of Care: Skilled Nursing Facility Prior Approval Number:    Date Approved/Denied:   PASRR Number: [id number]  Discharge Plan: SNF    Current Diagnoses: Patient Active Problem List   Diagnosis Date Noted   Cognitive impairment 07/18/2024   Bacteremia due to Enterococcus 07/13/2024   Folate deficiency 04/15/2024   Alcohol withdrawal (HCC) 04/13/2024   Recurrent falls 04/13/2024   Neuropathy 04/12/2024   Hamstring strain, right, subsequent encounter 03/30/2021    Orientation RESPIRATION BLADDER Height & Weight     Self, Time, Place, Situation  Normal Incontinent Weight: 179 lb 14.3 oz (81.6 kg) Height:     BEHAVIORAL SYMPTOMS/MOOD NEUROLOGICAL BOWEL NUTRITION STATUS      Continent Diet (See DC Summary)  AMBULATORY STATUS COMMUNICATION OF NEEDS Skin   Extensive Assist Verbally Other (Comment) (wound on coccyx)                       Personal Care Assistance Level of Assistance  Bathing, Feeding, Dressing Bathing Assistance: Maximum assistance Feeding assistance: Independent Dressing Assistance: Maximum assistance     Functional Limitations Info  Sight, Hearing, Speech Sight Info: Impaired Hearing Info: Adequate Speech Info: Adequate    SPECIAL CARE FACTORS FREQUENCY  PT (By licensed PT), OT (By licensed OT)     PT Frequency: 5x/week OT Frequency: 5x/week            Contractures Contractures Info: Not present     Additional Factors Info  Code Status, Allergies Code Status Info: Full Allergies Info: NKA           Current Medications (07/20/2024):  This is the current hospital active medication list Current Facility-Administered Medications  Medication Dose Route Frequency Provider Last Rate Last Admin   acetaminophen  (TYLENOL ) tablet 650 mg  650 mg Oral Q4H PRN Rosan Deward ORN, NP       aspirin  EC tablet 81 mg  81 mg Oral Daily Georgina Basket, MD   81 mg at 07/20/24 1003   [START ON 07/21/2024] cyanocobalamin  (VITAMIN B12) tablet 1,000 mcg  1,000 mcg Oral Daily Patsy Lenis, MD       enoxaparin  (LOVENOX ) injection 40 mg  40 mg Subcutaneous Daily Georgina Basket, MD   40 mg at 07/20/24 1002   feeding supplement (ENSURE PLUS HIGH PROTEIN) liquid 237 mL  237 mL Oral BID BM Danford, Lonni SQUIBB, MD   237 mL at 07/19/24 1455   folic acid  (FOLVITE ) tablet 1 mg  1 mg Oral Daily Georgina Basket, MD   1 mg at 07/20/24 1002   linezolid  (ZYVOX ) tablet 600 mg  600 mg Oral Q12H Vu, Trung T, MD       liver oil-zinc  oxide (DESITIN) 40 % ointment   Topical q12n4p Jonel Lonni SQUIBB, MD   Given at 07/20/24 1240   loperamide  (IMODIUM ) capsule 2-4 mg  2-4 mg Oral PRN Chavez, Abigail, NP   4 mg at 07/17/24 2306   magnesium   sulfate IVPB 4 g 100 mL  4 g Intravenous Once Girguis, David, MD       multivitamin with minerals tablet 1 tablet  1 tablet Oral Daily Danford, Lonni SQUIBB, MD   1 tablet at 07/20/24 1002   nicotine  (NICODERM CQ  - dosed in mg/24 hours) patch 21 mg  21 mg Transdermal Daily Danford, Lonni SQUIBB, MD   21 mg at 07/20/24 1003   nicotine  polacrilex (NICORETTE ) gum 2 mg  2 mg Oral PRN Danford, Lonni SQUIBB, MD       oxyCODONE  (Oxy IR/ROXICODONE ) immediate release tablet 5 mg  5 mg Oral Q4H PRN Jonel Lonni SQUIBB, MD   5 mg at 07/19/24 2333   potassium chloride  (KLOR-CON ) packet 40 mEq  40 mEq Oral Q4H Patsy Lenis, MD       pyridOXINE  (VITAMIN B6) tablet 50 mg  50 mg Oral Daily Georgina Basket,  MD   50 mg at 07/20/24 1002   thiamine  (VITAMIN B1) tablet 100 mg  100 mg Oral Daily Georgina Basket, MD   100 mg at 07/20/24 1002     Discharge Medications: Please see discharge summary for a list of discharge medications.  Relevant Imaging Results:  Relevant Lab Results:   Additional Information SSN: [id number]  Jeoffrey LITTIE Moose, LCSWA     "

## 2024-07-20 NOTE — Plan of Care (Signed)
  Problem: Activity: Goal: Risk for activity intolerance will decrease Outcome: Progressing   Problem: Elimination: Goal: Will not experience complications related to urinary retention Outcome: Progressing   Problem: Pain Managment: Goal: General experience of comfort will improve and/or be controlled Outcome: Progressing

## 2024-07-20 NOTE — Progress Notes (Signed)
 " Progress Note    Veronica Gaines   FMW:[id number]  DOB: Jul 18, 1970  DOA: 07/11/2024     8 PCP: Patient, No Pcp Per  Initial CC: weakness  Hospital Course: 54 y.o. 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  In the ER, found to have lactic acidosis, hypotension and proctitis.  Admitted on antibiotics then became hypotensive and so was started on Levophed  and admited to ICU.  Subsequently found to have E faecalis bacteremia.  ID also consulted.  Assessment and Plan:  Septic shock due to enterococcal bacteremia - resolved  E. Faecalis bacteremia  - Admitted with tachycardia, tachypnea, hypotension, on antibiotics and pressors, found to have Enterococcus faecalis bacteremia. - Repeat cultures from 07/12/2024 remain negative -ID following, appreciate assistance - TEE performed 1/5. 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  Neurology were consulted.  Underwent MRI of the brain, CT and L-spine, that were unremarkable.   RPR, TSH normal.  Was found to have B12 deficiency, folate deficiency, and B6 deficiency, started on supplementation.  Since discharge, she reports she has not been eating, not been walking.  She started having diarrhea and believes family members told her to stop taking vitamin B12 because this was causing the diarrhea. -  continue oral B12; level sufficient on 12/31 - Continue pyridoxine , folate, thiamine , MVI - PT/OT; SNF recommended  - Needs PCP for repeat B12, folate and B6 levels in 2 months   Globus sensation Reported to SLP earlier in hospital stay, none since.  Esophagram 1/2 limited due to patient poor cooperation, but only mild narrowing distal esophagus. - Continue regular diet   Right-sided weakness Resolved.  Likely acute on chronic weakness due to infection.   Alcoholism Alcohol withdrawal, mild Reports drinking 4 standard drinks per day, typically liquor.  Feels that she is having withdrawals.  No history of complicated withdrawals. Completed Librium  taper here.   Hypomagnesemia Hypokalemia - Supplement potassium and magnesium    Asymptomatic bacteriuria No symptoms elicited by me today -Hold antibiotics for now, monitor  Interval History:  No events overnight.  Underwent TEE today.  No vegetations. Medically stable at this point for rehab.  Antibiotics transitioned to Zyvox  from ampicillin  per ID  Antimicrobials: Zosyn  12/27 >> 12/29 Vanc 12/28 x 1 Ampicillin  12/29 >> 07/20/2024 Zyvox  07/20/2024 >> current  Consultants:  ID  Procedures:  07/20/2024: TEE  DVT prophylaxis:  enoxaparin  (LOVENOX ) injection 40 mg Start: 07/12/24 1000   Code Status:   Code Status: Full Code  Barriers to discharge: none  Therapy evaluation: PT Orders: Active   PT Follow up Rec: Skilled Nursing-Short Term Rehab (<3 Hours/Day)07/15/2024 1645  Disposition Plan:  SNF Status is: Inpt  Mobility Assessment (Last 72 Hours)     Mobility Assessment     Row Name 07/20/24 1012 07/19/24 2333 07/19/24 1007 07/18/24 2207 07/18/24 1116   Does the patient have exclusion criteria? No- Perform mobility assessment No- Perform mobility assessment No- Perform  mobility assessment No- Perform mobility assessment No- Perform mobility assessment   Mobility Assessment Exclusion Criteria No exclusion criteria  present, perform mobility assessment No exclusion criteria present, perform mobility assessment No exclusion criteria present, perform mobility assessment No exclusion criteria present, perform mobility assessment No exclusion criteria present, perform mobility assessment   What is the highest level of mobility based on the mobility assessment? Level 3 (Stands with assistance) - Balance while standing  and cannot march in place Level 3 (Stands with assistance) - Balance while standing  and cannot march in place Level 4 (Ambulates with assistance) - Balance while stepping forward/back - Complete Level 3 (Stands with assistance) - Balance while standing  and cannot march in place Level 3 (Stands with assistance) - Balance while standing  and cannot march in place   Is the above level different from baseline mobility prior to current illness? Yes - Recommend PT order Yes - Recommend PT order Yes - Recommend PT order Yes - Recommend PT order Yes - Recommend PT order    Row Name 07/17/24 2300           Does the patient have exclusion criteria? No- Perform mobility assessment  redness and tenderness in groin area       Mobility Assessment Exclusion Criteria No exclusion criteria present, perform mobility assessment       What is the highest level of mobility based on the mobility assessment? Level 3 (Stands with assistance) - Balance while standing  and cannot march in place       Is the above level different from baseline mobility prior to current illness? Yes - Recommend PT order          Diet: Diet Orders (From admission, onward)     Start     Ordered   07/20/24 1016  Diet regular Fluid consistency: Thin  Diet effective now       Question:  Fluid consistency:  Answer:  Thin   07/20/24 1015            Objective: Blood pressure 112/87, pulse 84, temperature (!) 97.3 F (36.3 C), temperature source Oral, resp. rate 16, weight 81.6 kg, SpO2 100%.  Examination:  Physical  Exam Constitutional:      Appearance: Normal appearance.  HENT:     Head: Normocephalic and atraumatic.     Mouth/Throat:     Mouth: Mucous membranes are moist.  Eyes:     Extraocular Movements: Extraocular movements intact.  Cardiovascular:     Rate and Rhythm: Normal rate and regular rhythm.  Pulmonary:     Effort: Pulmonary effort is normal. No respiratory distress.     Breath sounds: Normal breath sounds. No wheezing.  Abdominal:     General: Bowel sounds are normal. There is no distension.     Palpations: Abdomen is soft.     Tenderness: There is no abdominal tenderness.  Musculoskeletal:        General: Normal range of motion.     Cervical back: Normal range of motion and neck supple.  Skin:    General: Skin is warm and dry.  Neurological:     Mental Status: She is alert.     Comments: No focal deficits.  Does have slightly tangential thought pattern and rapid speech at times.  Underlying cognitive impairment and difficulty with concentration noted      Data Reviewed: Results for orders placed or performed during the hospital encounter of 07/11/24 (from the past 24 hours)  Basic metabolic panel with GFR     Status: Abnormal   Collection Time: 07/20/24  5:00 AM  Result Value Ref Range   Sodium 140 135 - 145 mmol/L   Potassium 3.3 (L) 3.5 - 5.1 mmol/L   Chloride 102 98 - 111 mmol/L   CO2 28 22 - 32 mmol/L   Glucose, Bld 62 (L) 70 - 99 mg/dL   BUN 9 6 - 20 mg/dL   Creatinine, Ser 9.13 0.44 - 1.00 mg/dL   Calcium 8.1 (L) 8.9 - 10.3 mg/dL   GFR, Estimated >39 >39 mL/min   Anion gap 9 5 - 15  CBC with Differential/Platelet     Status: Abnormal   Collection Time: 07/20/24  5:00 AM  Result Value Ref Range   WBC 5.0 4.0 - 10.5 K/uL   RBC 3.75 (L) 3.87 - 5.11 MIL/uL   Hemoglobin 11.8 (L) 12.0 - 15.0 g/dL   HCT 64.4 (L) 63.9 - 53.9 %   MCV 94.7 80.0 - 100.0 fL   MCH 31.5 26.0 - 34.0 pg   MCHC 33.2 30.0 - 36.0 g/dL   RDW 82.7 (H) 88.4 - 84.4 %   Platelets 208 150 -  400 K/uL   nRBC 0.0 0.0 - 0.2 %   Neutrophils Relative % 42 %   Neutro Abs 2.1 1.7 - 7.7 K/uL   Lymphocytes Relative 43 %   Lymphs Abs 2.2 0.7 - 4.0 K/uL   Monocytes Relative 12 %   Monocytes Absolute 0.6 0.1 - 1.0 K/uL   Eosinophils Relative 1 %   Eosinophils Absolute 0.0 0.0 - 0.5 K/uL   Basophils Relative 1 %   Basophils Absolute 0.0 0.0 - 0.1 K/uL   Immature Granulocytes 1 %   Abs Immature Granulocytes 0.03 0.00 - 0.07 K/uL  Magnesium      Status: Abnormal   Collection Time: 07/20/24  5:00 AM  Result Value Ref Range   Magnesium  1.6 (L) 1.7 - 2.4 mg/dL     I have reviewed pertinent nursing notes, vitals, labs, and images as necessary. I have ordered labwork to follow up on as indicated.  I have reviewed the last notes from staff over past 24 hours. I have discussed patient's care plan and test results with nursing staff, CM/SW, and other staff as appropriate.  Old records reviewed in assessment of this patient  Time spent: Greater than 50% of the 55 minute visit was spent in counseling/coordination of care for the patient as laid out in the A&P.   LOS: 8 days   Alm Apo, MD [facility] 07/20/2024, 1:54 PM "

## 2024-07-20 NOTE — Progress Notes (Signed)
 PT Cancellation Note  Patient Details Name: DYLANN LAYNE MRN: [id number] DOB: 09-27-1970   Cancelled Treatment:    Reason Eval/Treat Not Completed: Patient at procedure or test/unavailable  Aleck Daring, PT, DPT Acute Rehabilitation Services Office [phone]    Aleck ONEIDA Daring 07/20/2024, 8:06 AM

## 2024-07-20 NOTE — Transfer of Care (Signed)
 Immediate Anesthesia Transfer of Care Note  Patient: Veronica Gaines  Procedure(s) Performed: TRANSESOPHAGEAL ECHOCARDIOGRAM  Patient Location: Cath Lab  Anesthesia Type:General  Level of Consciousness: awake, alert , and oriented  Airway & Oxygen Therapy: Patient Spontanous Breathing and Patient connected to nasal cannula oxygen  Post-op Assessment: Report given to RN and Post -op Vital signs reviewed and stable  Post vital signs: Reviewed and stable  Last Vitals:  Vitals Value Taken Time  BP    Temp    Pulse    Resp    SpO2      Last Pain:  Vitals:   07/20/24 0800  TempSrc:   PainSc: 0-No pain      Patients Stated Pain Goal: 0 (07/14/24 0844)  Complications: There were no known notable events for this encounter.

## 2024-07-20 NOTE — Anesthesia Preprocedure Evaluation (Addendum)
"                                    Anesthesia Evaluation  Patient identified by MRN, date of birth, ID band Patient awake    Reviewed: Allergy & Precautions, NPO status , Patient's Chart, lab work & pertinent test results  Airway Mallampati: II  TM Distance: >3 FB Neck ROM: Full    Dental  (+) Loose,    Pulmonary Current Smoker and Patient abstained from smoking.   Pulmonary exam normal        Cardiovascular Normal cardiovascular exam     Neuro/Psych  PSYCHIATRIC DISORDERS       Neuromuscular disease    GI/Hepatic ,,,(+)     substance abuse    Endo/Other    Renal/GU      Musculoskeletal   Abdominal   Peds  Hematology  (+) Blood dyscrasia, anemia   Anesthesia Other Findings bacteremia  Reproductive/Obstetrics                              Anesthesia Physical Anesthesia Plan  ASA: 3  Anesthesia Plan: MAC   Post-op Pain Management:    Induction:   PONV Risk Score and Plan: 1 and Propofol  infusion and Treatment may vary due to age or medical condition  Airway Management Planned: Nasal Cannula  Additional Equipment:   Intra-op Plan:   Post-operative Plan:   Informed Consent: I have reviewed the patients History and Physical, chart, labs and discussed the procedure including the risks, benefits and alternatives for the proposed anesthesia with the patient or authorized representative who has indicated his/her understanding and acceptance.     Dental advisory given  Plan Discussed with: CRNA  Anesthesia Plan Comments:          Anesthesia Quick Evaluation  "

## 2024-07-20 NOTE — Plan of Care (Signed)

## 2024-07-20 NOTE — TOC Progression Note (Signed)
 Transition of Care [facility]) - Progression Note    Patient Details  Name: Veronica Gaines MRN: [id number] Date of Birth: Nov 15, 1970  Transition of Care [facility]) CM/SW Contact  [name], [place] Phone Number: 07/20/2024, 1:45 PM  Clinical Narrative:    Pt current;y Ox4- CSW met with pt at bedside and completed SNF workup. Pt is agreeable to SNF following hospital DC. CSW completed Fl2 and sent out SNF referrals. CSW will follow up to provide bed offers.    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, SNF Pending bed offer               Expected Discharge Plan and Services       Living arrangements for the past 2 months: Single Family Home                                       Social Drivers of Health (SDOH) Interventions SDOH Screenings   Food Insecurity: No Food Insecurity (07/12/2024)  Housing: Low Risk (07/12/2024)  Transportation Needs: No Transportation Needs (07/12/2024)  Utilities: Not At Risk (07/12/2024)  Tobacco Use: High Risk (07/14/2024)    Readmission Risk Interventions    04/14/2024    3:13 PM  Readmission Risk Prevention Plan  Post Dischage Appt Complete  Medication Screening Complete  Transportation Screening Complete

## 2024-07-20 NOTE — Interval H&P Note (Signed)
 History and Physical Interval Note:  07/20/2024 8:37 AM  Veronica Gaines  has presented today for surgery, with the diagnosis of bacteremia.  The various methods of treatment have been discussed with the patient and family. After consideration of risks, benefits and other options for treatment, the patient has consented to  Procedures: TRANSESOPHAGEAL ECHOCARDIOGRAM (N/A) as a surgical intervention.  The patient's history has been reviewed, patient examined, no change in status, stable for surgery.  I have reviewed the patient's chart and labs.  Questions were answered to the patient's satisfaction.   She has a loose Right mandibular 1st molar.  We discussed the risks of this procedure to her are length.  Reviewed probe size and alternatives to the procedure.  She was amenable to proceed.   Matelyn Antonelli A Elier Zellars

## 2024-07-20 NOTE — Progress Notes (Signed)
 Physical Therapy Treatment Patient Details Name: Veronica Gaines MRN: [id number] DOB: 10-12-1970 Today's Date: 07/20/2024   History of Present Illness 54 y.o. female adm 07/11/24 for septic shock d/t enterococcal bacteremia. 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    PT Comments  Pt agreeable to participate with encouragement; she prefers an AM therapy session for when she feels fresher. Plan of session to transfer to [facility] and then ambulate back to bed. Pt requiring minA (+2 safety) for transfers and limited ambulation distance using RW. Demonstrates 1 episode of R knee buckle and continues with generalized weakness, impaired standing balance, decreased activity tolerance. Patient will benefit from continued inpatient follow up therapy, <3 hours/day to address deficits and maximize functional mobility.      If plan is discharge home, recommend the following: A lot of help with walking and/or transfers;A lot of help with bathing/dressing/bathroom;Assistance with cooking/housework;Assist for transportation;Help with stairs or ramp for entrance   Can travel by private vehicle     No  Equipment Recommendations  Wheelchair (measurements PT);Wheelchair cushion (measurements PT);BSC/3in1    Recommendations for Other Services       Precautions / Restrictions Precautions Precautions: Fall Recall of Precautions/Restrictions: Impaired Restrictions Weight Bearing Restrictions Per Provider Order: No     Mobility  Bed Mobility                    Transfers Overall transfer level: Needs assistance Equipment used: Rolling walker (2 wheels) Transfers: Sit to/from Stand, Bed to chair/wheelchair/BSC Sit to Stand: Min assist, +2 safety/equipment   Step pivot transfers: Min assist, +2 safety/equipment       General transfer comment: Verbal cues for scooting out to edge of chair, minA to power up to stand. Initial R  knee buckle. Pivotal steps over to [facility] towards R with increased time    Ambulation/Gait Ambulation/Gait assistance: Min assist, +2 safety/equipment Gait Distance (Feet): 10 Feet Assistive device: Rolling walker (2 wheels) Gait Pattern/deviations: Step-to pattern, Decreased dorsiflexion - right, Decreased stride length, Trunk flexed, Leaning posteriorly Gait velocity: decreased Gait velocity interpretation: <1.31 ft/sec, indicative of household ambulator   General Gait Details: Heavy reliance through arms on walker, slowed step to pattern, minA for steadying assist   Stairs             Wheelchair Mobility     Tilt Bed    Modified Rankin (Stroke Patients Only)       Balance Overall balance assessment: Needs assistance Sitting-balance support: Feet supported Sitting balance-Leahy Scale: Fair     Standing balance support: Bilateral upper extremity supported, Reliant on assistive device for balance Standing balance-Leahy Scale: Poor                              Communication Communication Communication: No apparent difficulties  Cognition Arousal: Alert Behavior During Therapy: Anxious   PT - Cognitive impairments: No family/caregiver present to determine baseline, Awareness, Initiation, Sequencing, Problem solving, Safety/Judgement                       PT - Cognition Comments: Anxious and intermittently irritated stating therapist is rushing her. Following commands: Impaired Following commands impaired: Follows one step commands with increased time    Cueing Cueing Techniques: Verbal cues, Visual cues, Gestural cues  Exercises      General Comments        Pertinent Vitals/Pain  Pain Assessment Pain Assessment: Faces Faces Pain Scale: Hurts little more    Home Living                          Prior Function            PT Goals (current goals can now be found in the care plan section) Acute Rehab PT Goals Patient  Stated Goal: Regain independence and walk further distances Potential to Achieve Goals: Good Progress towards PT goals: Progressing toward goals    Frequency    Min 2X/week      PT Plan      Co-evaluation              AM-PAC PT 6 Clicks Mobility   Outcome Measure  Help needed turning from your back to your side while in a flat bed without using bedrails?: A Little Help needed moving from lying on your back to sitting on the side of a flat bed without using bedrails?: A Little Help needed moving to and from a bed to a chair (including a wheelchair)?: A Little Help needed standing up from a chair using your arms (e.g., wheelchair or bedside chair)?: A Little Help needed to walk in hospital room?: Total Help needed climbing 3-5 steps with a railing? : Total 6 Click Score: 14    End of Session Equipment Utilized During Treatment: Gait belt Activity Tolerance: Patient tolerated treatment well Patient left: in bed;with bed alarm set;with call bell/phone within reach Nurse Communication: Mobility status PT Visit Diagnosis: Difficulty in walking, not elsewhere classified (R26.2);Other abnormalities of gait and mobility (R26.89);Unsteadiness on feet (R26.81)     Time: 1510-1540 PT Time Calculation (min) (ACUTE ONLY): 30 min  Charges:    $Therapeutic Activity: 23-37 mins PT General Charges $$ ACUTE PT VISIT: 1 Visit                     Aleck Daring, PT, DPT Acute Rehabilitation Services Office [phone]    Aleck ONEIDA Daring 07/20/2024, 4:11 PM

## 2024-07-21 DIAGNOSIS — R6521 Severe sepsis with septic shock: Secondary | ICD-10-CM | POA: Diagnosis not present

## 2024-07-21 DIAGNOSIS — R5381 Other malaise: Secondary | ICD-10-CM | POA: Diagnosis not present

## 2024-07-21 DIAGNOSIS — R7881 Bacteremia: Secondary | ICD-10-CM | POA: Diagnosis not present

## 2024-07-21 DIAGNOSIS — B952 Enterococcus as the cause of diseases classified elsewhere: Secondary | ICD-10-CM | POA: Diagnosis not present

## 2024-07-21 NOTE — TOC Progression Note (Signed)
 Transition of Care [facility]) - Progression Note    Patient Details  Name: Veronica Gaines MRN: [id number] Date of Birth: [birth year]  Transition of Care [facility] CM/SW Contact  Wendelyn Kiesling LITTIE [place], [place] Phone Number: 07/21/2024, 2:20 PM  Clinical Narrative:    CSW provided pt with bed offer received from [facility]. Pt stated she was going to see if her daughter could check it out before making her decision. CSW will continue to follow.   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, SNF Pending bed offer               Expected Discharge Plan and Services       Living arrangements for the past 2 months: Single Family Home                                       Social Drivers of Health (SDOH) Interventions SDOH Screenings   Food Insecurity: No Food Insecurity (07/12/2024)  Housing: Low Risk (07/12/2024)  Transportation Needs: No Transportation Needs (07/12/2024)  Utilities: Not At Risk (07/12/2024)  Tobacco Use: High Risk (07/14/2024)    Readmission Risk Interventions    04/14/2024    3:13 PM  Readmission Risk Prevention Plan  Post Dischage Appt Complete  Medication Screening Complete  Transportation Screening Complete

## 2024-07-21 NOTE — Progress Notes (Signed)
 " Progress Note    Veronica Gaines   FMW:[id number]  DOB: Jul 17, 1970  DOA: 07/11/2024     9 PCP: Patient, No Pcp Per  Initial CC: weakness  Hospital Course: 54 y.o. 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  In the ER, found to have lactic acidosis, hypotension and proctitis.  Admitted on antibiotics then became hypotensive and so was started on Levophed  and admited to ICU.  Subsequently found to have E faecalis bacteremia.  ID also consulted.  Assessment and Plan:  Septic shock due to enterococcal bacteremia - resolved  E. Faecalis bacteremia  - Admitted with tachycardia, tachypnea, hypotension, on antibiotics and pressors, found to have Enterococcus faecalis bacteremia. - Repeat cultures from 07/12/2024 remain negative -ID following, appreciate assistance - TEE performed 1/5. 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  Neurology were consulted.  Underwent MRI of the brain, CT and L-spine, that were unremarkable.   RPR, TSH normal.  Was found to have B12 deficiency, folate deficiency, and B6 deficiency, started on supplementation.  Since discharge, she reports she has not been eating, not been walking.  She started having diarrhea and believes family members told her to stop taking vitamin B12 because this was causing the diarrhea. -  continue oral B12; level sufficient on 12/31 - Continue pyridoxine , folate, thiamine , MVI - PT/OT; SNF recommended  - Needs PCP for repeat B12, folate and B6 levels in 2 months   Globus sensation Reported to SLP earlier in hospital stay, none since.  Esophagram 1/2 limited due to patient poor cooperation, but only mild narrowing distal esophagus. - Continue regular diet   Right-sided weakness Resolved.  Likely acute on chronic weakness due to infection.   Alcoholism Alcohol withdrawal, mild - resolved  Reports drinking 4 standard drinks per day, typically liquor.  Feels that she is having withdrawals.  No history of complicated withdrawals. Completed Librium  taper here.   Hypomagnesemia Hypokalemia - Supplement potassium and magnesium    Asymptomatic bacteriuria No symptoms elicited by me today -Hold antibiotics for now, monitor  Interval History:  No events overnight.  Resting in bed comfortable this morning. Work with therapy. Remains medically stable and awaiting rehab placement.  Antimicrobials: Zosyn  12/27 >> 12/29 Vanc 12/28 x 1 Ampicillin  12/29 >> 07/20/2024 Zyvox  07/20/2024 >> current  Consultants:  ID  Procedures:  07/20/2024: TEE  DVT prophylaxis:  enoxaparin  (LOVENOX ) injection 40 mg Start: 07/12/24 1000   Code Status:   Code Status: Full Code  Barriers to discharge: none  Therapy evaluation: PT Orders: Active   PT Follow up Rec: Skilled Nursing-Short Term Rehab (<3 Hours/Day)07/20/2024 1517  Disposition Plan:  SNF Status is: Inpt  Mobility Assessment (Last 72 Hours)     Mobility Assessment     Row Name 07/21/24 0750 07/20/24 2045 07/20/24 1517 07/20/24 1012 07/19/24 2333   Does the patient have exclusion criteria? No- Perform mobility assessment No- Perform mobility assessment -- No- Perform mobility assessment  No- Perform mobility assessment   Mobility Assessment Exclusion Criteria No exclusion criteria present, perform mobility assessment No  exclusion criteria present, perform mobility assessment -- No exclusion criteria present, perform mobility assessment No exclusion criteria present, perform mobility assessment   What is the highest level of mobility based on the mobility assessment? Level 3 (Stands with assistance) - Balance while standing  and cannot march in place Level 3 (Stands with assistance) - Balance while standing  and cannot march in place Level 3 (Stands with assistance) - Balance while standing  and cannot march in place Level 3 (Stands with assistance) - Balance while standing  and cannot march in place Level 3 (Stands with assistance) - Balance while standing  and cannot march in place   Is the above level different from baseline mobility prior to current illness? Yes - Recommend PT order Yes - Recommend PT order -- Yes - Recommend PT order Yes - Recommend PT order    Row Name 07/19/24 1007 07/18/24 2207         Does the patient have exclusion criteria? No- Perform mobility assessment No- Perform mobility assessment      Mobility Assessment Exclusion Criteria No exclusion criteria present, perform mobility assessment No exclusion criteria present, perform mobility assessment      What is the highest level of mobility based on the mobility assessment? Level 4 (Ambulates with assistance) - Balance while stepping forward/back - Complete Level 3 (Stands with assistance) - Balance while standing  and cannot march in place      Is the above level different from baseline mobility prior to current illness? Yes - Recommend PT order Yes - Recommend PT order         Diet: Diet Orders (From admission, onward)     Start     Ordered   07/20/24 1016  Diet regular Fluid consistency: Thin  Diet effective now       Question:  Fluid consistency:  Answer:  Thin   07/20/24 1015            Objective: Blood pressure 95/69, pulse 86, temperature (!) 97.4 F (36.3 C), resp. rate 16, weight 81.6 kg, SpO2 100%.  Examination:   Physical Exam Constitutional:      Appearance: Normal appearance.  HENT:     Head: Normocephalic and atraumatic.     Mouth/Throat:     Mouth: Mucous membranes are moist.  Eyes:     Extraocular Movements: Extraocular movements intact.  Cardiovascular:     Rate and Rhythm: Normal rate and regular rhythm.  Pulmonary:     Effort: Pulmonary effort is normal. No respiratory distress.     Breath sounds: Normal breath sounds. No wheezing.  Abdominal:     General: Bowel sounds are normal. There is no distension.     Palpations: Abdomen is soft.     Tenderness: There is no abdominal tenderness.  Musculoskeletal:        General: Normal range of motion.     Cervical back: Normal range of motion and neck supple.  Skin:    General: Skin is warm and dry.  Neurological:     Mental Status: She is alert.     Comments: No focal deficits.  Does have slightly tangential thought pattern and rapid speech at times.  Underlying cognitive impairment and difficulty with concentration noted      Data Reviewed: No results found for this or any previous visit (from the past 24 hours).  I have reviewed pertinent nursing notes, vitals, labs, and images as necessary. I have ordered labwork to follow up on as indicated.  I have reviewed the last notes from staff over past 24 hours. I have discussed patient's care plan and test results with nursing staff, CM/SW, and other staff as appropriate.  Old records reviewed in assessment of this patient    LOS: 9 days   Alm Apo, MD [facility] 07/21/2024, 12:32 PM "

## 2024-07-21 NOTE — Plan of Care (Signed)
" °  Problem: Health Behavior/Discharge Planning: Goal: Ability to manage health-related needs will improve Outcome: Progressing   Problem: Clinical Measurements: Goal: Diagnostic test results will improve Outcome: Progressing   Problem: Activity: Goal: Risk for activity intolerance will decrease Outcome: Progressing   Problem: Pain Managment: Goal: General experience of comfort will improve and/or be controlled Outcome: Progressing   Problem: Skin Integrity: Goal: Risk for impaired skin integrity will decrease Outcome: Progressing   "

## 2024-07-21 NOTE — Progress Notes (Signed)
 Occupational Therapy Treatment Patient Details Name: Veronica Gaines MRN: [id number] DOB: Feb 11, 1971 Today's Date: 07/21/2024   History of present illness 54 y.o. female adm 07/11/24 for septic shock d/t enterococcal bacteremia. 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   OT comments  Pt progressing towards OT goals. Focus of session on education and instruction on BUE HEP to address decreased fine motor skills impacting independent engagement in ADL tasks. See details below regarding exercises. Pt respectfully declined OOB mobility d/t BLE pain and swelling. OT to continue to follow Pt acutely to facilitate progress towards goals. Continue per POC.       If plan is discharge home, recommend the following:  A lot of help with walking and/or transfers;A lot of help with bathing/dressing/bathroom;Assistance with cooking/housework;Direct supervision/assist for medications management;Direct supervision/assist for financial management;Assist for transportation;Help with stairs or ramp for entrance   Equipment Recommendations  Other (comment) (defer next venue)    Recommendations for Other Services      Precautions / Restrictions Precautions Precautions: Fall Recall of Precautions/Restrictions: Impaired Restrictions Weight Bearing Restrictions Per Provider Order: No       Mobility Bed Mobility               General bed mobility comments: Not completed this session as Pt requested to let BLE rest. Endorsed swelling and throbbing sensations.    Transfers                   General transfer comment: Not completed this session as Pt requested to let BLE rest. Endorsed swelling and throbbing sensations.     Balance                                           ADL either performed or assessed with clinical judgement   ADL Overall ADL's : Needs assistance/impaired Eating/Feeding: Minimal  assistance Eating/Feeding Details (indicate cue type and reason): Min A to open and manipulate containers                                   General ADL Comments: Pt requesting remain bed level for session d/t BLE swelling and pain    Extremity/Trunk Assessment Upper Extremity Assessment Upper Extremity Assessment: RUE deficits/detail;LUE deficits/detail;Generalized weakness RUE Coordination: decreased fine motor;decreased gross motor LUE Coordination: decreased fine motor;decreased gross motor            Diplomatic Services Operational Officer Communication Communication: Impaired Factors Affecting Communication: Difficulty expressing self (verbose; communication deficits following stroke difficulty word finding occassionally)   Cognition Arousal: Alert Behavior During Therapy: Anxious Cognition: Cognition impaired, No family/caregiver present to determine baseline     Awareness: Online awareness impaired   Attention impairment (select first level of impairment): Selective attention Executive functioning impairment (select all impairments): Organization, Sequencing, Reasoning, Problem solving OT - Cognition Comments: Pt with difficulty reasoning and problem solving to complete tasks. Pt verbose, requires polite and discrete redirection.                 Following commands: Impaired Following commands impaired: Follows one step commands with increased time      Cueing   Cueing Techniques: Verbal cues, Visual cues  Exercises Exercises:  Hand exercises Hand Exercises Digit Composite Flexion: AROM, Both, 5 reps, Other (comment) (yellow theraputty) Composite Extension: AROM, Both, 5 reps, Other (comment) (yellow theraputty) Opposition: Other (comment), AROM, Both (serial opposition with yellow theraputty)    Shoulder Instructions       General Comments Addressed Pt concerns and questions regarding stroke recovery process     Pertinent Vitals/ Pain       Pain Assessment Pain Assessment: Faces Faces Pain Scale: Hurts even more Pain Location: BLE Pain Descriptors / Indicators: Discomfort, Sore, Throbbing Pain Intervention(s): Limited activity within patient's tolerance, Monitored during session  Home Living                                          Prior Functioning/Environment              Frequency  Min 2X/week        Progress Toward Goals  OT Goals(current goals can now be found in the care plan section)  Progress towards OT goals: Progressing toward goals  Acute Rehab OT Goals Patient Stated Goal: get better OT Goal Formulation: With patient Time For Goal Achievement: 07/29/24 Potential to Achieve Goals: Good ADL Goals Pt Will Perform Lower Body Bathing: with supervision;with adaptive equipment Pt Will Perform Lower Body Dressing: with min assist;with adaptive equipment;sitting/lateral leans Pt Will Transfer to Toilet: with contact guard assist;ambulating;bedside commode Pt/caregiver will Perform Home Exercise Program: Both right and left upper extremity;With written HEP provided (fine motor) Additional ADL Goal #1: Pt will engage in bed mobility with supervision as a precursor to ADL tasks OOB.  Plan      Co-evaluation                 AM-PAC OT 6 Clicks Daily Activity     Outcome Measure   Help from another person eating meals?: A Little Help from another person taking care of personal grooming?: A Little Help from another person toileting, which includes using toliet, bedpan, or urinal?: A Lot Help from another person bathing (including washing, rinsing, drying)?: A Lot Help from another person to put on and taking off regular upper body clothing?: A Little Help from another person to put on and taking off regular lower body clothing?: A Lot 6 Click Score: 15    End of Session    OT Visit Diagnosis: Unsteadiness on feet (R26.81);Muscle weakness  (generalized) (M62.81);History of falling (Z91.81);Other symptoms and signs involving cognitive function;Pain   Activity Tolerance Patient limited by pain   Patient Left in bed;with call bell/phone within reach   Nurse Communication Mobility status        Time: 1011-1036 OT Time Calculation (min): 25 min  Charges: OT General Charges $OT Visit: 1 Visit OT Treatments $Therapeutic Exercise: 23-37 mins  Maurilio CROME, OTR/LSABRA  [facility] Acute Rehabilitation  Office: [phone]   Maurilio PARAS Arvis Miguez 07/21/2024, 3:35 PM

## 2024-07-21 NOTE — Plan of Care (Signed)
  Problem: Clinical Measurements: Goal: Diagnostic test results will improve Outcome: Progressing   Problem: Activity: Goal: Risk for activity intolerance will decrease Outcome: Progressing   Problem: Pain Managment: Goal: General experience of comfort will improve and/or be controlled Outcome: Progressing   Problem: Safety: Goal: Ability to remain free from injury will improve Outcome: Progressing   Problem: Skin Integrity: Goal: Risk for impaired skin integrity will decrease Outcome: Progressing

## 2024-07-22 DIAGNOSIS — E876 Hypokalemia: Secondary | ICD-10-CM | POA: Diagnosis not present

## 2024-07-22 DIAGNOSIS — R7881 Bacteremia: Secondary | ICD-10-CM | POA: Diagnosis not present

## 2024-07-22 DIAGNOSIS — R6521 Severe sepsis with septic shock: Secondary | ICD-10-CM | POA: Diagnosis not present

## 2024-07-22 LAB — BASIC METABOLIC PANEL WITH GFR
Anion gap: 8 (ref 5–15)
BUN: 8 mg/dL (ref 6–20)
CO2: 26 mmol/L (ref 22–32)
Calcium: 8.4 mg/dL — ABNORMAL LOW (ref 8.9–10.3)
Chloride: 105 mmol/L (ref 98–111)
Creatinine, Ser: 0.87 mg/dL (ref 0.44–1.00)
GFR, Estimated: >60 mL/min
Glucose, Bld: 67 mg/dL — ABNORMAL LOW (ref 70–99)
Potassium: 3.7 mmol/L (ref 3.5–5.1)
Sodium: 138 mmol/L (ref 135–145)

## 2024-07-22 LAB — MAGNESIUM: Magnesium: 2 mg/dL (ref 1.7–2.4)

## 2024-07-22 LAB — GLUCOSE, CAPILLARY
Glucose-Capillary: 70 mg/dL (ref 70–99)
Glucose-Capillary: 71 mg/dL (ref 70–99)
Glucose-Capillary: 76 mg/dL (ref 70–99)
Glucose-Capillary: 89 mg/dL (ref 70–99)

## 2024-07-22 NOTE — Progress Notes (Signed)
 " PROGRESS NOTE   Veronica Gaines  FMW:[id number]    DOB: 25-Jan-1971    DOA: 07/11/2024  PCP: Veronica Gaines, No Pcp Per   I have briefly reviewed patients previous medical records in [facility].   Brief Hospital Course:  54 y.o. 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 In the ER, found to have lactic acidosis, hypotension and proctitis. Admitted on antibiotics then became hypotensive and so was started on Levophed  and admited to ICU. Subsequently found to have E faecalis bacteremia. ID also consulted.    Assessment & Plan:   Septic shock due to enterococcal bacteremia - resolved  E. Faecalis bacteremia  - Admitted with tachycardia, tachypnea, hypotension, on antibiotics and pressors, found to have Enterococcus faecalis bacteremia. - Repeat cultures from 07/12/2024: Negative, final report -ID signed off 1/5 - TEE performed 1/5. No vegetations - Prior TRH MD discussed with ID; rec'd for 1 more week of Zyvox  to complete course; end date 1/11   Cognitive impairment -Lower limit of normal B1 back in September 2025 as well; also folate deficiency -This may be impairment in the setting of chronic alcohol abuse given clinical history -Continue multivitamin, thiamine , folate  - continue B6   Macrocytic anemia, lower extremity weakness and neuropathy due to nutritional deficiencies Vitamin B12 deficiency Vitamin B6 deficiency Folate deficiency Admitted in September for leg weakness, neuropathy.  Neurology were consulted.  Underwent MRI of the brain, CT and L-spine, that were unremarkable.   RPR, TSH normal.  Was found to have B12 deficiency, folate deficiency, and B6 deficiency, started on supplementation.  Since discharge, she reports she has not been eating, not been walking.  She started having diarrhea and believes family members told her to stop  taking vitamin B12 because this was causing the diarrhea. - continue oral B12; level sufficient on 12/31 - Continue pyridoxine , folate, thiamine , MVI - PT/OT; SNF recommended.  TOC on board.  Awaiting Veronica Gaines/family decision regarding SNF choice. - Needs PCP for repeat B12, folate and B6 levels in 2 months    Globus sensation Reported to SLP earlier in hospital stay, none since.  Esophagram 1/2 limited due to Veronica Gaines poor cooperation, but only mild narrowing distal esophagus. - Continue regular diet and tolerating.   Right-sided weakness Resolved.  Likely acute on chronic weakness due to infection.   Alcoholism Alcohol withdrawal, mild - resolved  Reports drinking 4 standard drinks per day, typically liquor.No history of complicated withdrawals. Completed Librium  taper here.   Hypomagnesemia Hypokalemia - Replaced.  Follow-up BMP and magnesium  in a.m.   Asymptomatic bacteriuria No antibiotics for this indication.  Body mass index is 28.18 kg/m.   DVT prophylaxis: enoxaparin  (LOVENOX ) injection 40 mg Start: 07/12/24 1000     Code Status: Full Code:  Family Communication: None at bedside. Disposition:  Status is: Inpatient Remains inpatient appropriate because: Medically optimized for DC pending SNF.     Consultants:   Infectious disease  Procedures:     Subjective:  Veronica Gaines was concerned why we were checking her CBGs.  Advised her that her blood sugars on serial BMPs over the last 3 days had mildly low blood sugars.  She denies any symptoms from this.  CBGs x 3 today in the low 70s.  Complains of bilateral lower extremity swelling but also volunteers information that she sits up with legs hanging down for extended.  Soft time.  Advised her  to raise her feet up even when sitting.  Offered compression stockings which she refuses.  No other complaints.  Objective:   Vitals:   07/21/24 1812 07/21/24 2257 07/22/24 1143 07/22/24 1300  BP: 108/82 (!) 89/65 (!) 113/93    Pulse: 89 85 91   Resp: 14 17 18    Temp: (!) 97.1 F (36.2 C) 97.6 F (36.4 C) 98.1 F (36.7 C)   TempSrc:      SpO2: 100% 100% 92%   Weight:    81.6 kg  Height:    5' 7 (1.702 m)    General exam: Young female, looks older than stated age, sitting up comfortably at edge of bed. Respiratory system: Clear to auscultation. Respiratory effort normal. Cardiovascular system: S1 & S2 heard, RRR. No JVD, murmurs, rubs, gallops or clicks.  1+ pitting bilateral leg edema. Gastrointestinal system: Abdomen is nondistended, soft and nontender. No organomegaly or masses felt. Normal bowel sounds heard. Central nervous system: Alert and oriented. No focal neurological deficits. Extremities: Symmetric 5 x 5 power. Skin: No rashes, lesions or ulcers Psychiatry: Judgement and insight appear somewhat impaired. Mood & affect appropriate.     Data Reviewed:   I have personally reviewed following labs and imaging studies   CBC: Recent Labs  Lab 07/16/24 0447 07/17/24 0618 07/20/24 0500  WBC 3.7* 4.0 5.0  NEUTROABS  --   --  2.1  HGB 11.0* 10.6* 11.8*  HCT 32.5* 31.6* 35.5*  MCV 95.6 94.3 94.7  PLT 160 176 208    Basic Metabolic Panel: Recent Labs  Lab 07/16/24 0447 07/17/24 0618 07/18/24 0818 07/20/24 0500 07/22/24 0628  NA 139 139 138 140 138  K 3.2* 3.2* 3.6 3.3* 3.7  CL 101 101 103 102 105  CO2 30 29 26 28 26   GLUCOSE 83 73 63* 62* 67*  BUN 8 6 6 9 8   CREATININE 0.65 0.65 0.74 0.86 0.87  CALCIUM 8.0* 8.0* 7.8* 8.1* 8.4*  MG 1.8  --   --  1.6* 2.0  PHOS 1.4*  --  2.4*  --   --     Liver Function Tests: Recent Labs  Lab 07/16/24 0447 07/18/24 0818  ALBUMIN 2.2* 2.0*    CBG: Recent Labs  Lab 07/22/24 0827 07/22/24 1246 07/22/24 1649  GLUCAP 70 71 76    Microbiology Studies:   Recent Results (from the past 240 hours)  Urine Culture (for pregnant, neutropenic or urologic patients or patients with an indwelling urinary catheter)     Status: Abnormal    Collection Time: 07/12/24  6:09 PM   Specimen: Urine, Clean Catch  Result Value Ref Range Status   Specimen Description URINE, CLEAN CATCH  Final   Special Requests   Final    NONE Performed at [facility], [address], [place], [place] [address]    Culture >=100,000 COLONIES/mL PSEUDOMONAS AERUGINOSA (A)  Final   Report Status 07/15/2024 FINAL  Final   Organism ID, Bacteria PSEUDOMONAS AERUGINOSA (A)  Final      Susceptibility   Pseudomonas aeruginosa - MIC*    MEROPENEM 1 SENSITIVE Sensitive     CIPROFLOXACIN 0.5 SENSITIVE Sensitive     IMIPENEM 1 SENSITIVE Sensitive     PIP/TAZO Value in next row Sensitive      16 SENSITIVEThis is a modified FDA-approved test that has been validated and its performance characteristics determined by the reporting laboratory.  This laboratory is certified under the Clinical Laboratory Improvement Amendments CLIA as qualified  to perform high complexity clinical laboratory testing.    CEFEPIME Value in next row Sensitive      16 SENSITIVEThis is a modified FDA-approved test that has been validated and its performance characteristics determined by the reporting laboratory.  This laboratory is certified under the Clinical Laboratory Improvement Amendments CLIA as qualified to perform high complexity clinical laboratory testing.    CEFTAZIDIME/AVIBACTAM Value in next row Sensitive      16 SENSITIVEThis is a modified FDA-approved test that has been validated and its performance characteristics determined by the reporting laboratory.  This laboratory is certified under the Clinical Laboratory Improvement Amendments CLIA as qualified to perform high complexity clinical laboratory testing.    CEFTOLOZANE/TAZOBACTAM Value in next row Sensitive      16 SENSITIVEThis is a modified FDA-approved test that has been validated and its performance characteristics determined by the reporting laboratory.  This laboratory is certified under the Clinical Laboratory  Improvement Amendments CLIA as qualified to perform high complexity clinical laboratory testing.    TOBRAMYCIN Value in next row Sensitive      16 SENSITIVEThis is a modified FDA-approved test that has been validated and its performance characteristics determined by the reporting laboratory.  This laboratory is certified under the Clinical Laboratory Improvement Amendments CLIA as qualified to perform high complexity clinical laboratory testing.    CEFTAZIDIME Value in next row Sensitive      16 SENSITIVEThis is a modified FDA-approved test that has been validated and its performance characteristics determined by the reporting laboratory.  This laboratory is certified under the Clinical Laboratory Improvement Amendments CLIA as qualified to perform high complexity clinical laboratory testing.    * >=100,000 COLONIES/mL PSEUDOMONAS AERUGINOSA  Culture, blood (Routine X 2) w Reflex to ID Panel     Status: None   Collection Time: 07/12/24  8:29 PM   Specimen: BLOOD  Result Value Ref Range Status   Specimen Description BLOOD BLOOD LEFT HAND  Final   Special Requests   Final    BOTTLES DRAWN AEROBIC AND ANAEROBIC Blood Culture adequate volume   Culture   Final    NO GROWTH 5 DAYS Performed at [facility], [address], [place], [place] [address]    Report Status 07/17/2024 FINAL  Final  Culture, blood (Routine X 2) w Reflex to ID Panel     Status: None   Collection Time: 07/12/24  8:29 PM   Specimen: BLOOD  Result Value Ref Range Status   Specimen Description BLOOD BLOOD RIGHT ARM  Final   Special Requests   Final    BOTTLES DRAWN AEROBIC AND ANAEROBIC Blood Culture results may not be optimal due to an inadequate volume of blood received in culture bottles   Culture   Final    NO GROWTH 5 DAYS Performed at [facility], [address], [place], [place] [address]    Report Status 07/17/2024 FINAL  Final    Radiology Studies:  No results found.  Scheduled Meds:    aspirin   EC  81 mg Oral Daily   vitamin B-12  1,000 mcg Oral Daily   enoxaparin  (LOVENOX ) injection  40 mg Subcutaneous Daily   feeding supplement  237 mL Oral BID BM   folic acid   1 mg Oral Daily   linezolid   600 mg Oral Q12H   liver oil-zinc  oxide   Topical q12n4p   multivitamin with minerals  1 tablet Oral Daily   nicotine   21 mg Transdermal Daily  pyridOXINE   50 mg Oral Daily   thiamine   100 mg Oral Daily    Continuous Infusions:     LOS: 10 days     Trenda Mar, MD,  FACP, [facility], [facility]   [facility] [place]   To contact the attending provider between 7A-7P or the covering provider during after hours 7P-7A, please log into the web site [email or web address] and access using universal [place] password for that web site. If you do not have the password, please call the hospital operator.  07/22/2024, 4:50 PM   "

## 2024-07-22 NOTE — Progress Notes (Signed)
 Patient refused to allow writer to photograph and measure sacral wound, she states it was done yesterday.

## 2024-07-22 NOTE — TOC Progression Note (Signed)
 Transition of Care [facility]) - Progression Note    Patient Details  Name: Veronica Gaines MRN: [id number] Date of Birth: [birth year]  [facility] CM/SW Contact  Alvon Nygaard LITTIE [place], [place] Phone Number: 07/22/2024, 4:23 PM  Clinical Narrative:    CSW spoke with pt regarding SNF bed offer. Pt stated her daughter is a CNA and she wants to know her thoughts on facility before making a decision. Pt stated her daughter is going to research [facility] tonight and give CSW decision tomorrow morning. CSW will continue to follow.   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, SNF Pending bed offer               Expected Discharge Plan and Services       Living arrangements for the past 2 months: Single Family Home                                       Social Drivers of Health (SDOH) Interventions SDOH Screenings   Food Insecurity: No Food Insecurity (07/12/2024)  Housing: Low Risk (07/12/2024)  Transportation Needs: No Transportation Needs (07/12/2024)  Utilities: Not At Risk (07/12/2024)  Tobacco Use: High Risk (07/14/2024)    Readmission Risk Interventions    04/14/2024    3:13 PM  Readmission Risk Prevention Plan  Post Dischage Appt Complete  Medication Screening Complete  Transportation Screening Complete

## 2024-07-22 NOTE — Progress Notes (Signed)
 Physical Therapy Treatment Patient Details Name: MONICKA CYRAN MRN: [id number] DOB: Sep 27, 1970 Today's Date: 07/22/2024   History of Present Illness 54 y.o. female adm 07/11/24 for septic shock d/t enterococcal bacteremia. 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    PT Comments  Pt progressing incrementally towards her physical therapy goals. She is received on [facility] and requesting to return to bed post session. Pt requiring minA for transfers and ambulating ~20 ft with RW and slowed step to pattern. Reports throbbing, pain in posterior R hamstring at rest and with mobility. No R knee buckle noted this session. She is limited by generalized weakness, impaired standing balance, decreased activity tolerance. Patient will benefit from continued inpatient follow up therapy, <3 hours/day.    If plan is discharge home, recommend the following: A lot of help with walking and/or transfers;A lot of help with bathing/dressing/bathroom;Assistance with cooking/housework;Assist for transportation;Help with stairs or ramp for entrance   Can travel by private vehicle     No  Equipment Recommendations  Wheelchair (measurements PT);Wheelchair cushion (measurements PT);BSC/3in1    Recommendations for Other Services       Precautions / Restrictions Precautions Precautions: Fall Recall of Precautions/Restrictions: Impaired Restrictions Weight Bearing Restrictions Per Provider Order: No     Mobility  Bed Mobility               General bed mobility comments: Pt sitting on BSC upon entry    Transfers Overall transfer level: Needs assistance Equipment used: Rolling walker (2 wheels) Transfers: Sit to/from Stand Sit to Stand: Min assist           General transfer comment: minA to power up to stand    Ambulation/Gait Ambulation/Gait assistance: Min assist, +2 safety/equipment Gait Distance (Feet): 20 Feet Assistive  device: Rolling walker (2 wheels) Gait Pattern/deviations: Step-to pattern, Decreased dorsiflexion - right, Decreased stride length, Trunk flexed Gait velocity: decreased Gait velocity interpretation: <1.31 ft/sec, indicative of household ambulator   General Gait Details: No R knee buckle this session, heavy reliance through arms on walker, slow and effortful pace.   Stairs             Wheelchair Mobility     Tilt Bed    Modified Rankin (Stroke Patients Only)       Balance Overall balance assessment: Needs assistance Sitting-balance support: Feet supported Sitting balance-Leahy Scale: Fair     Standing balance support: Bilateral upper extremity supported Standing balance-Leahy Scale: Poor                              Communication Communication Communication: Impaired Factors Affecting Communication: Difficulty expressing self (verbose; communication deficits following stroke difficulty word finding occassionally)  Cognition Arousal: Alert Behavior During Therapy: Anxious   PT - Cognitive impairments: No family/caregiver present to determine baseline, Awareness, Initiation, Sequencing, Problem solving, Safety/Judgement                       PT - Cognition Comments: Anxious and intermittently irritable stating therapist is playing too much with her as therapist was just trying to make basic conversation. States she doesn't like dealing with everyone's different personalities Following commands: Impaired Following commands impaired: Follows one step commands with increased time    Cueing Cueing Techniques: Verbal cues, Visual cues  Exercises      General Comments        Pertinent Vitals/Pain Pain  Assessment Pain Assessment: Faces Faces Pain Scale: Hurts even more Pain Location: posterior RLE Pain Descriptors / Indicators: Discomfort, Sore, Throbbing Pain Intervention(s): Monitored during session, Limited activity within patient's  tolerance    Home Living                          Prior Function            PT Goals (current goals can now be found in the care plan section) Acute Rehab PT Goals PT Goal Formulation: With patient Time For Goal Achievement: 07/29/24 Potential to Achieve Goals: Good Progress towards PT goals: Progressing toward goals    Frequency    Min 2X/week      PT Plan      Co-evaluation              AM-PAC PT 6 Clicks Mobility   Outcome Measure  Help needed turning from your back to your side while in a flat bed without using bedrails?: A Little Help needed moving from lying on your back to sitting on the side of a flat bed without using bedrails?: A Little Help needed moving to and from a bed to a chair (including a wheelchair)?: A Little Help needed standing up from a chair using your arms (e.g., wheelchair or bedside chair)?: A Little Help needed to walk in hospital room?: A Little Help needed climbing 3-5 steps with a railing? : Total 6 Click Score: 16    End of Session Equipment Utilized During Treatment: Gait belt Activity Tolerance: Patient tolerated treatment well Patient left: in bed;with call bell/phone within reach;with nursing/sitter in room Nurse Communication: Mobility status PT Visit Diagnosis: Difficulty in walking, not elsewhere classified (R26.2);Other abnormalities of gait and mobility (R26.89);Unsteadiness on feet (R26.81)     Time: 8941-8869 PT Time Calculation (min) (ACUTE ONLY): 32 min  Charges:    $Therapeutic Activity: 23-37 mins PT General Charges $$ ACUTE PT VISIT: 1 Visit                     Aleck Daring, PT, DPT Acute Rehabilitation Services Office [phone]    Aleck ONEIDA Daring 07/22/2024, 12:57 PM

## 2024-07-22 NOTE — Plan of Care (Signed)
  Problem: Clinical Measurements: Goal: Ability to maintain clinical measurements within normal limits will improve Outcome: Progressing   Problem: Activity: Goal: Risk for activity intolerance will decrease Outcome: Progressing   Problem: Safety: Goal: Ability to remain free from injury will improve Outcome: Progressing   

## 2024-07-23 DIAGNOSIS — R7881 Bacteremia: Secondary | ICD-10-CM | POA: Diagnosis not present

## 2024-07-23 DIAGNOSIS — E876 Hypokalemia: Secondary | ICD-10-CM | POA: Diagnosis not present

## 2024-07-23 DIAGNOSIS — E43 Unspecified severe protein-calorie malnutrition: Secondary | ICD-10-CM | POA: Insufficient documentation

## 2024-07-23 LAB — PHOSPHORUS: Phosphorus: 2.5 mg/dL (ref 2.5–4.6)

## 2024-07-23 LAB — BASIC METABOLIC PANEL WITH GFR
Anion gap: 7 (ref 5–15)
BUN: 8 mg/dL (ref 6–20)
CO2: 23 mmol/L (ref 22–32)
Calcium: 8.1 mg/dL — ABNORMAL LOW (ref 8.9–10.3)
Chloride: 105 mmol/L (ref 98–111)
Creatinine, Ser: 0.88 mg/dL (ref 0.44–1.00)
GFR, Estimated: >60 mL/min
Glucose, Bld: 68 mg/dL — ABNORMAL LOW (ref 70–99)
Potassium: 4.9 mmol/L (ref 3.5–5.1)
Sodium: 136 mmol/L (ref 135–145)

## 2024-07-23 LAB — MAGNESIUM: Magnesium: 1.8 mg/dL (ref 1.7–2.4)

## 2024-07-23 NOTE — Progress Notes (Addendum)
 " PROGRESS NOTE   Veronica Gaines  FMW:[id number]    DOB: [birth year]    DOA: 07/11/2024  PCP: Patient, No Pcp Per   I have briefly reviewed patients previous medical records in [facility].   Brief Hospital Course:  54 y.o. 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 In the ER, found to have lactic acidosis, hypotension and proctitis. Admitted on antibiotics then became hypotensive and so was started on Levophed  and admited to ICU. Subsequently found to have E faecalis bacteremia. ID also consulted.   Medically optimized for DC pending SNF.  Patient has picked in SNF, insurance authorization being initiated.   Assessment & Plan:   Septic shock due to enterococcal bacteremia - resolved  E. Faecalis bacteremia  - Admitted with tachycardia, tachypnea, hypotension, on antibiotics and pressors, found to have Enterococcus faecalis bacteremia. - Repeat cultures from 07/12/2024: Negative, final report -ID signed off 1/5 - TEE performed 1/5. No vegetations - Prior TRH MD discussed with ID; rec'd for 1 more week of Zyvox  to complete course; end date 1/11   Cognitive impairment -Lower limit of normal B1 back in September 2025 as well; also folate deficiency -This may be impairment in the setting of chronic alcohol abuse given clinical history -Continue multivitamin, thiamine , folate  - continue B6   Macrocytic anemia, lower extremity weakness and neuropathy due to nutritional deficiencies Vitamin B12 deficiency Vitamin B6 deficiency Folate deficiency Admitted in September for leg weakness, neuropathy.  Neurology were consulted.  Underwent MRI of the brain, CT and L-spine, that were unremarkable.   RPR, TSH normal.  Was found to have B12 deficiency, folate deficiency, and B6 deficiency, started on supplementation.  Since discharge, she reports she  has not been eating, not been walking.  She started having diarrhea and believes family members told her to stop taking vitamin B12 because this was causing the diarrhea. - continue oral B12; level sufficient on 12/31 - Continue pyridoxine , folate, thiamine , MVI - PT/OT; SNF recommended.  TOC on board.  Awaiting patient/family decision regarding SNF choice. - Needs PCP for repeat B12, folate and B6 levels in 2 months    Globus sensation Reported to SLP earlier in hospital stay, none since.  Esophagram 1/2 limited due to patient poor cooperation, but only mild narrowing distal esophagus. - Continue regular diet and tolerating.   Right-sided weakness Resolved.  Likely acute on chronic weakness due to infection.   Alcoholism Alcohol withdrawal, mild - resolved  Reports drinking 4 standard drinks per day, typically liquor.No history of complicated withdrawals. Completed Librium  taper here.   Hypomagnesemia Hypokalemia - Replaced.  Follow-up BMP and magnesium  in a.m.   Asymptomatic bacteriuria No antibiotics for this indication.  Bilateral leg edema Suspect nutritional and gravitational.  Patient agreeable to lower extremity compression stockings which she had declined yesterday.  Low blood glucose On serial BMPs, a.m. blood sugars in the 60s but patient not symptomatic of hypoglycemia.  Did serial CBGs yesterday without hypoglycemia.  Discontinued CBGs.  Unclear etiology.  Nutrition Status: Nutrition Problem: Severe Malnutrition Etiology: acute illness Signs/Symptoms: severe muscle depletion, severe fat depletion, percent weight loss (19.6% weight loss from 04/19/24) Percent weight loss: 19.6 % Interventions: Refer to RD note for recommendations Agree with registered dietitian input and management, continue.  Body mass index is 28.18 kg/m.   DVT prophylaxis: enoxaparin  (LOVENOX ) injection 40 mg Start: 07/12/24 1000  Code Status: Full Code:  Family Communication: None at  bedside. Disposition:  Status is: Inpatient Remains inpatient appropriate because: Medically optimized for DC pending SNF.  As per [facility] note, it appears that patient has decided on SNF and SNF will start insurance authorization.     Consultants:   Infectious disease  Procedures:     Subjective:  Denies complaints.  Ongoing bilateral leg swelling without pain.  Objective:   Vitals:   07/22/24 1753 07/22/24 2022 07/23/24 0400 07/23/24 1010  BP: (!) 88/67 93/62 (!) 115/93 (!) 132/97  Pulse: 80 80 90 (!) 101  Resp: 18  18 18   Temp: 97.6 F (36.4 C) 97.8 F (36.6 C) 98 F (36.7 C)   TempSrc:  Oral Oral   SpO2: 100% 100% 92% 100%  Weight:      Height:        General exam: Young female, looks older than stated age, sitting up comfortably at edge of bed. Respiratory system: Clear to auscultation. Respiratory effort normal. Cardiovascular system: S1 & S2 heard, RRR. No JVD, murmurs, rubs, gallops or clicks.  1-2+ pitting bilateral leg edema. Gastrointestinal system: Abdomen is nondistended, soft and nontender. No organomegaly or masses felt. Normal bowel sounds heard. Central nervous system: Alert and oriented. No focal neurological deficits. Extremities: Symmetric 5 x 5 power. Skin: No rashes, lesions or ulcers Psychiatry: Judgement and insight appear somewhat impaired. Mood & affect appropriate.     Data Reviewed:   I have personally reviewed following labs and imaging studies   CBC: Recent Labs  Lab 07/17/24 0618 07/20/24 0500  WBC 4.0 5.0  NEUTROABS  --  2.1  HGB 10.6* 11.8*  HCT 31.6* 35.5*  MCV 94.3 94.7  PLT 176 208    Basic Metabolic Panel: Recent Labs  Lab 07/17/24 0618 07/18/24 0818 07/20/24 0500 07/22/24 0628 07/23/24 0633  NA 139 138 140 138 136  K 3.2* 3.6 3.3* 3.7 4.9  CL 101 103 102 105 105  CO2 29 26 28 26 23   GLUCOSE 73 63* 62* 67* 68*  BUN 6 6 9 8 8   CREATININE 0.65 0.74 0.86 0.87 0.88  CALCIUM 8.0* 7.8* 8.1* 8.4* 8.1*  MG  --   --   1.6* 2.0 1.8  PHOS  --  2.4*  --   --  2.5    Liver Function Tests: Recent Labs  Lab 07/18/24 0818  ALBUMIN 2.0*    CBG: Recent Labs  Lab 07/22/24 1246 07/22/24 1649 07/22/24 2024  GLUCAP 71 76 89    Microbiology Studies:   No results found for this or any previous visit (from the past 240 hours).   Radiology Studies:  No results found.  Scheduled Meds:    aspirin  EC  81 mg Oral Daily   vitamin B-12  1,000 mcg Oral Daily   enoxaparin  (LOVENOX ) injection  40 mg Subcutaneous Daily   feeding supplement  237 mL Oral BID BM   folic acid   1 mg Oral Daily   linezolid   600 mg Oral Q12H   liver oil-zinc  oxide   Topical q12n4p   multivitamin with minerals  1 tablet Oral Daily   nicotine   21 mg Transdermal Daily   pyridOXINE   50 mg Oral Daily   thiamine   100 mg Oral Daily    Continuous Infusions:     LOS: 11 days     Trenda Mar, MD,  FACP, [facility], [facility], [facility]   [facility] Advisor [place]   To  contact the attending provider between 7A-7P or the covering provider during after hours 7P-7A, please log into the web site [email or web address] and access using universal [place] password for that web site. If you do not have the password, please call the hospital operator.  07/23/2024, 4:11 PM   "

## 2024-07-23 NOTE — Plan of Care (Signed)
 Awaiting SNF coordination.  PIV removed per patient request.   Problem: Education: Goal: Knowledge of General Education information will improve Description: Including pain rating scale, medication(s)/side effects and non-pharmacologic comfort measures Outcome: Progressing   Problem: Health Behavior/Discharge Planning: Goal: Ability to manage health-related needs will improve Outcome: Progressing   Problem: Clinical Measurements: Goal: Ability to maintain clinical measurements within normal limits will improve Outcome: Progressing Goal: Will remain free from infection Outcome: Progressing Goal: Diagnostic test results will improve Outcome: Progressing

## 2024-07-23 NOTE — Progress Notes (Signed)
 Nutrition Follow-up  DOCUMENTATION CODES:   Severe malnutrition in context of acute illness/injury  INTERVENTION:   Encourage good PO intake Ensure Plus High Protein po BID, each supplement provides 350 kcal and 20 grams of protein Continue Thiamine , Folic acid , Vitamin B6, and Multivitamin w/ minerals daily  NUTRITION DIAGNOSIS:   Severe Malnutrition related to acute illness as evidenced by severe muscle depletion, severe fat depletion, percent weight loss (19.6% weight loss from 04/19/24). - New dx  GOAL:   Patient will meet greater than or equal to 90% of their needs - Progressing   MONITOR:   PO intake, Supplement acceptance, Weight trends  REASON FOR ASSESSMENT:   Consult Assessment of nutrition requirement/status  ASSESSMENT:   54 y.o. female presented to the ED with weakness and diarrhea. PMH includes EtOH abuse, vertigo, prior stroke with R-side deficits, and polyneuropathy. Pt admitted with sepsis secondary to acute cystitis.   12/27 - Admitted 12/30 - transferred to floor  Pt sitting up in bed on assessment. Has not been doing well in the last couple of months due to an infected tooth that was causing her pain. She reports since August 2025 she was only eating 1 can of soup per day and had no appetite d/t the pain. Was admitted in September for leg weakness and neuropathy where it was found she had nutritional deficiencies causing lower extremity weakness. Pt was deficient in B12, folate, and B6 and was started on supplementation. Since discharge pt has not been walking, eating, and started having diarrhea. Was not compliant with Vitamin supplementation.   Pt reports UBW of 220 lbs. Per chart review, patient with significant weight loss within the past 3 months. Patient with a 19.6% weight loss from 04/19/24, this is clinically significant for time frame. Likely weight is even lower as pt with edema in lower extremities. Pt is sevrely malnourished, encouraged increased  po intake and intake of Ensures. Has been refusing Ensures as she is lactose intolerant, dicussed that Ensure is suitable for lactose intolerance, pt willing to try them today. Did not eat breakfast yet today. Had 100% of breakfast, 50% of lunch and 0% for dinner yesterday.   Exam limited d/t pitting edema in lower extremities, still found to have severe muscle and fat deficits.   Dispo: Awaiting patient/family decision regarding SNF choice.   Admission Weight: 72.6 kg Current Weight: 81.6 kg 1+ pitting bilateral leg edema.   Average Meal Intake: 1/4-1/7: 50-100% intake x 6 recorded meals  Nutritionally Relevant Medications: Scheduled Meds:  vitamin B-12  1,000 mcg Oral Daily   feeding supplement  237 mL Oral BID BM   folic acid   1 mg Oral Daily   linezolid   600 mg Oral Q12H   liver oil-zinc  oxide   Topical q12n4p   multivitamin with minerals  1 tablet Oral Daily   nicotine   21 mg Transdermal Daily   pyridOXINE   50 mg Oral Daily   thiamine   100 mg Oral Daily   Labs Reviewed: Folate <3.0 Vitamin B12 Vitamin B6 2.3 (Low) Vitamin B1 79.9 CBG ranges from 70-89 mg/dL over the last 24 hours No A1C  NUTRITION - FOCUSED PHYSICAL EXAM:  Flowsheet Row Most Recent Value  Orbital Region Moderate depletion  Upper Arm Region Moderate depletion  Thoracic and Lumbar Region Severe depletion  Buccal Region Severe depletion  Temple Region Severe depletion  Clavicle Bone Region Severe depletion  Clavicle and Acromion Bone Region Severe depletion  Scapular Bone Region Severe depletion  Dorsal Hand Severe  depletion  Patellar Region Unable to assess  [Fluid]  Anterior Thigh Region Unable to assess  [Fluid]  Posterior Calf Region Unable to assess  [Fluid]  Edema (RD Assessment) Moderate  Hair Reviewed  Eyes Reviewed  Mouth Reviewed  Skin Reviewed  Nails Reviewed    Diet Order:   Diet Order             Diet regular Fluid consistency: Thin  Diet effective now                    EDUCATION NEEDS:   Education needs have been addressed  Skin:  Skin Assessment: Reviewed RN Assessment  Last BM:  12/31 - Type 6 (small)  Height:   Ht Readings from Last 1 Encounters:  07/22/24 5' 7 (1.702 m)    Weight:   Wt Readings from Last 1 Encounters:  07/22/24 81.6 kg    Ideal Body Weight:  61.4 kg  BMI:  Body mass index is 28.18 kg/m.  Estimated Nutritional Needs:   Kcal:  1800-2000 kcal  Protein:  100-120 gm  Fluid:  >1.8L/day   Olivia Kenning, RD Registered Dietitian  See Amion for more information

## 2024-07-23 NOTE — TOC Progression Note (Signed)
 Transition of Care [facility]) - Progression Note    Patient Details  Name: Veronica Gaines MRN: [id number] Date of Birth: 10/24/1970  [facility]) CM/SW Contact  Raeshawn Tafolla LITTIE [place], [place] Phone Number: 07/23/2024, 2:14 PM  Clinical Narrative:    CSW spoke with pt at bedside. Pt is currently Ox4 and is agreeable to [facility]. CSW contacted Brittany w/ [facility]- facility has initiated auth process for [facility]. CSW will continue to follow.   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, SNF Pending bed offer               Expected Discharge Plan and Services       Living arrangements for the past 2 months: Single Family Home                                       Social Drivers of Health (SDOH) Interventions SDOH Screenings   Food Insecurity: No Food Insecurity (07/12/2024)  Housing: Low Risk (07/12/2024)  Transportation Needs: No Transportation Needs (07/12/2024)  Utilities: Not At Risk (07/12/2024)  Tobacco Use: High Risk (07/14/2024)    Readmission Risk Interventions    04/14/2024    3:13 PM  Readmission Risk Prevention Plan  Post Dischage Appt Complete  Medication Screening Complete  Transportation Screening Complete

## 2024-07-24 ENCOUNTER — Other Ambulatory Visit (HOSPITAL_BASED_OUTPATIENT_CLINIC_OR_DEPARTMENT_OTHER): Payer: Self-pay

## 2024-07-24 DIAGNOSIS — R6521 Severe sepsis with septic shock: Secondary | ICD-10-CM | POA: Diagnosis not present

## 2024-07-24 DIAGNOSIS — R7881 Bacteremia: Secondary | ICD-10-CM | POA: Diagnosis not present

## 2024-07-24 MED ORDER — THIAMINE HCL 100 MG PO TABS
100.0000 mg | ORAL_TABLET | Freq: Every day | ORAL | 0 refills | Status: AC
  Filled 2024-07-24: qty 90, 90d supply, fill #0

## 2024-07-24 MED ORDER — ACETAMINOPHEN 325 MG PO TABS: 650.0000 mg | ORAL_TABLET | ORAL | Status: AC | PRN

## 2024-07-24 MED ORDER — ZINC OXIDE 40 % EX OINT: TOPICAL_OINTMENT | Freq: Two times a day (BID) | CUTANEOUS | Status: AC

## 2024-07-24 MED ORDER — ADULT MULTIVITAMIN W/MINERALS CH: 1.0000 | ORAL_TABLET | Freq: Every day | ORAL | Status: AC

## 2024-07-24 MED ORDER — LINEZOLID 600 MG PO TABS: 600.0000 mg | ORAL_TABLET | Freq: Two times a day (BID) | ORAL | Status: AC

## 2024-07-24 MED ORDER — FOLIC ACID 1 MG PO TABS: 1.0000 mg | ORAL_TABLET | Freq: Every day | ORAL | Status: AC

## 2024-07-24 MED ORDER — NICOTINE 21 MG/24HR TD PT24: 21.0000 mg | MEDICATED_PATCH | Freq: Every day | TRANSDERMAL | Status: AC

## 2024-07-24 MED ORDER — ENSURE PLUS HIGH PROTEIN PO LIQD: 237.0000 mL | Freq: Two times a day (BID) | ORAL | Status: AC

## 2024-07-24 NOTE — TOC Transition Note (Signed)
 Transition of Care [facility]) - Discharge Note   Patient Details  Name: Veronica Gaines MRN: [id number] Date of Birth: 05-20-1971  Transition of Care [facility]) CM/SW Contact:  Jeoffrey LITTIE [name] Phone Number: 07/24/2024, 2:51 PM   Clinical Narrative:    Patient will DC to: [facility] Anticipated DC date: 07/24/24 Family notified: Yes Transport by: Daughter   Per MD patient ready for DC to [facility]. RN to call report prior to discharge [phone]. RN, patient, patient's family, and facility notified of DC. Discharge Summary and FL2 sent to facility. DC packet on chart. Per pt, daughter to transport pt to facility.  CSW will sign off for now as social work intervention is no longer needed. Please consult us  again if new needs arise.     Final next level of care: Skilled Nursing Facility Barriers to Discharge: Barriers Resolved   Patient Goals and CMS Choice Patient states their goals for this hospitalization and ongoing recovery are:: SNF          Discharge Placement   Existing PASRR number confirmed : 07/24/24          Patient chooses bed at: Other - please specify in the comment section below: ([facility]) Patient to be transferred to facility by: Daughter Name of family member notified: [name] Patient and family notified of of transfer: 07/24/24  Discharge Plan and Services Additional resources added to the After Visit Summary for                                       Social Drivers of Health (SDOH) Interventions SDOH Screenings   Food Insecurity: No Food Insecurity (07/12/2024)  Housing: Low Risk (07/12/2024)  Transportation Needs: No Transportation Needs (07/12/2024)  Utilities: Not At Risk (07/12/2024)  Tobacco Use: High Risk (07/14/2024)     Readmission Risk Interventions    04/14/2024    3:13 PM  Readmission Risk Prevention Plan  Post Dischage Appt Complete  Medication Screening Complete  Transportation Screening Complete

## 2024-07-24 NOTE — Progress Notes (Signed)
 Occupational Therapy Treatment Patient Details Name: Veronica Gaines MRN: [id number] DOB: 1971/04/29 Today's Date: 07/24/2024   History of present illness 54 y.o. female adm 07/11/24 for septic shock d/t enterococcal bacteremia. 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   OT comments  This 54 yo female is doing well. She ambulated to the restroom with me with min A with RW, did all of her toileting with min A sit<>stand, was able to stand at the sink and do 2 grooming tasks with CGA at RW level. She will continue to benefit from acute OT with follow up from continued inpatient follow up therapy, <3 hours/day.       If plan is discharge home, recommend the following:  A little help with walking and/or transfers;A lot of help with bathing/dressing/bathroom;Assistance with cooking/housework;Assist for transportation   Equipment Recommendations  Other (comment) (TBD next venue)       Precautions / Restrictions Precautions Precautions: Fall Recall of Precautions/Restrictions: Impaired Restrictions Weight Bearing Restrictions Per Provider Order: No       Mobility Bed Mobility Overal bed mobility: Needs Assistance Bed Mobility: Supine to Sit, Sit to Supine     Supine to sit: Contact guard, HOB elevated, Used rails Sit to supine: Contact guard assist, HOB elevated, Used rails        Transfers Overall transfer level: Needs assistance Equipment used: Rolling walker (2 wheels) Transfers: Sit to/from Stand Sit to Stand: Min assist                 Balance Overall balance assessment: Needs assistance Sitting-balance support: No upper extremity supported, Feet supported Sitting balance-Leahy Scale: Good     Standing balance support: No upper extremity supported, During functional activity Standing balance-Leahy Scale: Fair Standing balance comment: standing at sink for washing face and working on hair                            ADL either performed or assessed with clinical judgement   ADL Overall ADL's : Needs assistance/impaired     Grooming: Contact guard assist;Standing Grooming Details (indicate cue type and reason): to wash face and work on Education Officer, Museum: Minimal assistance;Ambulation;Comfort height toilet;Grab bars;Rolling walker (2 wheels)   Toileting- Clothing Manipulation and Hygiene: Minimal assistance;Sit to/from stand              Extremity/Trunk Assessment Upper Extremity Assessment Upper Extremity Assessment: RUE deficits/detail;LUE deficits/detail RUE Deficits / Details: pt reports FM getting better and that she has to do heat on her hands before she does the theraputty LUE Deficits / Details: pt reports FM getting better and that she has to do heat on her hands before she does the theraputty            Vision Patient Visual Report: No change from baseline           Communication Communication Communication: No apparent difficulties   Cognition Arousal: Alert Behavior During Therapy: WFL for tasks assessed/performed Cognition: No apparent impairments                                                      Pertinent Vitals/ Pain  Pain Assessment Pain Assessment: No/denies pain         Frequency  Min 2X/week        Progress Toward Goals  OT Goals(current goals can now be found in the care plan section)  Progress towards OT goals: Progressing toward goals  Acute Rehab OT Goals Patient Stated Goal: to go to rehab soon OT Goal Formulation: With patient Time For Goal Achievement: 07/29/24 Potential to Achieve Goals: Good      AM-PAC OT 6 Clicks Daily Activity     Outcome Measure   Help from another person eating meals?: None Help from another person taking care of personal grooming?: A Little Help from another person toileting, which includes using toliet, bedpan, or  urinal?: A Little Help from another person bathing (including washing, rinsing, drying)?: A Lot Help from another person to put on and taking off regular upper body clothing?: A Little Help from another person to put on and taking off regular lower body clothing?: A Lot 6 Click Score: 17    End of Session Equipment Utilized During Treatment: Rolling walker (2 wheels)  OT Visit Diagnosis: Unsteadiness on feet (R26.81);Other abnormalities of gait and mobility (R26.89);Muscle weakness (generalized) (M62.81)   Activity Tolerance Patient tolerated treatment well   Patient Left in bed;with call bell/phone within reach;with bed alarm set           Time: 9141-9084 OT Time Calculation (min): 17 min  Charges: OT General Charges $OT Visit: 1 Visit OT Treatments $Self Care/Home Management : 8-22 mins  Donny BECKER OT Acute Rehabilitation Services Office [phone]    [place] Dorothyann Distel 07/24/2024, 10:37 AM

## 2024-07-24 NOTE — Discharge Instructions (Signed)

## 2024-07-24 NOTE — Plan of Care (Signed)
   Problem: Health Behavior/Discharge Planning: Goal: Ability to manage health-related needs will improve Outcome: Progressing   Problem: Nutrition: Goal: Adequate nutrition will be maintained Outcome: Progressing

## 2024-07-24 NOTE — Discharge Summary (Signed)
 Physician Discharge Summary  Veronica Gaines FMW:[id number] DOB: 29-Mar-1971  PCP: Patient, No Pcp Per  Admitted from: Home Discharged to: SNF  Admit date: 07/11/2024 Discharge date: 07/24/2024  Recommendations for Outpatient Follow-up:    Contact information for follow-up providers     MD at SNF Follow up.   Why: To be seen in 3 to 4 days with repeat labs (CBC, CMP, folate, B12, B6 and thiamine  levels)             Contact information for after-discharge care     Destination     [facility] .   Service: Skilled Nursing Contact information: [address] [place]  [address] [phone]                      Home Health: None    Equipment/Devices: TBD at SNF    Discharge Condition: Improved and stable.   Code Status: Full Code Diet recommendation:  Discharge Diet Orders (From admission, onward)     Start     Ordered   07/24/24 0000  Diet general        07/24/24 1406             Discharge Diagnoses:  Active Problems:   Bacteremia due to Enterococcus   Physical deconditioning   Cognitive impairment   Protein-calorie malnutrition, severe   Brief Hospital Course:  54 y.o. 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 In the ER, found to have lactic acidosis, hypotension and proctitis. Admitted on antibiotics then became hypotensive and so was started on Levophed  and admited to ICU. Subsequently found to have E faecalis bacteremia. ID also consulted.      Assessment & Plan:    Septic shock due to enterococcal bacteremia - resolved  E. Faecalis bacteremia  - Admitted with tachycardia, tachypnea, hypotension, on antibiotics and pressors, found to have Enterococcus faecalis bacteremia. - Repeat cultures from 07/12/2024: Negative, final report -ID signed off 1/5 -  TEE performed 1/5. No vegetations - Prior TRH MD discussed with ID; rec'd for 1 more week of Zyvox  to complete course; end date 07/27/2024 after a.m. dose. -Given Enterococcus faecalis bacteremia, recommend that patient gets colonoscopy screening as outpatient at the earliest, possibly after rehab.   Cognitive impairment -Lower limit of normal B1 back in September 2025 as well; also folate deficiency -This may be impairment in the setting of chronic alcohol abuse given clinical history -Continue multivitamin, thiamine , folate  - continue B6 B12 was markedly elevated, 1699 on 12/31.  Held B12 supplements until outpatient follow-up.  Continue thiamine , folate, B6 vitamin and multivitamins.  Recommend checking repeat levels of these vitamins to determine if needs to continue or can stop.   Macrocytic anemia, lower extremity weakness and neuropathy due to nutritional deficiencies Vitamin B12 deficiency, appears resolved Vitamin B6 deficiency Folate deficiency Admitted in September for leg weakness, neuropathy.  Neurology were consulted.  Underwent MRI of the brain, CT and L-spine, that were unremarkable.   RPR, TSH normal.  Was found to have B12 deficiency, folate deficiency, and B6 deficiency, started on supplementation.  Since discharge, she reports she has not been eating, not been walking.  She started having diarrhea and believes family members told her to stop taking vitamin B12 because this was causing the diarrhea. - See discussion above regarding vitamin supplementation. - PT/OT; SNF recommended.   -  Needs PCP for repeat B12, folate and B6 levels in 2 months    Globus sensation Reported to SLP earlier in hospital stay, none since.  Esophagram 1/2 limited due to patient poor cooperation, but only mild narrowing distal esophagus. - Continue regular diet and tolerating.   Right-sided weakness Resolved.  Likely acute on chronic weakness due to infection.   Alcoholism Alcohol withdrawal,  mild - resolved  Reports drinking 4 standard drinks per day, typically liquor.No history of complicated withdrawals. Completed Librium  taper here.   Hypomagnesemia Hypokalemia - Replaced.  Periodically follow labs.   Asymptomatic bacteriuria No antibiotics for this indication.   Bilateral leg edema Suspect nutritional and gravitational.  Patient agreeable to lower extremity compression stockings which she had declined yesterday.  Ordered yesterday but has not yet received them when I saw her this morning.   Low blood glucose On serial BMPs, a.m. blood sugars in the 60s but patient not symptomatic of hypoglycemia.  Did serial CBGs yesterday without hypoglycemia.  Discontinued CBGs.  Unclear etiology.   Nutrition Status: Nutrition Problem: Severe Malnutrition Etiology: acute illness Signs/Symptoms: severe muscle depletion, severe fat depletion, percent weight loss (19.6% weight loss from 04/19/24) Percent weight loss: 19.6 % Interventions: Refer to RD note for recommendations Agree with registered dietitian input and management, continue.   Body mass index is 28.18 kg/m.  Microscopic hematuria Urine microscopy on 12/28 showed 21-50 RBCs per high-power field.  No UTI symptoms.  Recommend repeating urine microscopy in 2 to 3 weeks and if this persists then may need further evaluation and maybe even a Urology consultation.  This can be pursued as outpatient.       Consultants:   Infectious disease PCCM Cardiology   Procedures:   As noted above   Discharge Instructions  Discharge Instructions     Call MD for:  difficulty breathing, headache or visual disturbances   Complete by: As directed    Call MD for:  extreme fatigue   Complete by: As directed    Call MD for:  persistant dizziness or light-headedness   Complete by: As directed    Call MD for:  persistant nausea and vomiting   Complete by: As directed    Call MD for:  redness, tenderness, or signs of infection  (pain, swelling, redness, odor or green/yellow discharge around incision site)   Complete by: As directed    Call MD for:  severe uncontrolled pain   Complete by: As directed    Call MD for:  temperature >100.4   Complete by: As directed    Diet general   Complete by: As directed    Discharge instructions   Complete by: As directed    Bilateral knee-high compression stockings.   Discharge wound care:   Complete by: As directed    Wound care  Every 12 hours    Comments: Buttock: cleanse with soap and water, pat dry with gauze, apply thin layer of Desitin ointment (pharm), monitor every shift. Pad bony areas with Mepilex foam dressings. Off-load heels at all times onto pillows/boots   Increase activity slowly   Complete by: As directed         Medication List     STOP taking these medications    amoxicillin 500 MG capsule Commonly known as: AMOXIL   chlorhexidine  0.12 % solution Commonly known as: PERIDEX    cyanocobalamin  1000 MCG tablet Commonly known as: VITAMIN B12   ibuprofen 600 MG tablet Commonly known as: ADVIL  TAKE these medications    acetaminophen  325 MG tablet Commonly known as: TYLENOL  Take 2 tablets (650 mg total) by mouth every 4 (four) hours as needed for fever, mild pain (pain score 1-3) or moderate pain (pain score 4-6).   aspirin  EC 81 MG tablet Take 81 mg by mouth daily. Swallow whole.   feeding supplement Liqd Take 237 mLs by mouth 2 (two) times daily between meals. Start taking on: July 25, 2024   folic acid  1 MG tablet Commonly known as: FOLVITE  Take 1 tablet (1 mg total) by mouth daily.   linezolid  600 MG tablet Commonly known as: ZYVOX  Take 1 tablet (600 mg total) by mouth every 12 (twelve) hours. Discontinue after 07/27/2024 morning dose.   liver oil-zinc  oxide 40 % ointment Commonly known as: DESITIN Apply topically 2 times daily at 12 noon and 4 pm. Apply to buttock, gluteal cleft, ischial, perineal and perirectal  areas.   multivitamin with minerals Tabs tablet Take 1 tablet by mouth daily. Start taking on: July 25, 2024   nicotine  21 mg/24hr patch Commonly known as: NICODERM CQ  - dosed in mg/24 hours Place 1 patch (21 mg total) onto the skin daily. Start taking on: July 25, 2024   thiamine  100 MG tablet Commonly known as: VITAMIN B1 Take 1 tablet (100 mg total) by mouth daily.   Vitamin B6 50 MG Tabs Take 1 tablet (50 mg total) by mouth daily.       Allergies[1]    Procedures/Studies: ECHO TEE Result Date: 07/20/2024    TRANSESOPHOGEAL ECHO REPORT   Patient Name:   PARNEET GLANTZ Date of Exam: 07/20/2024 Medical Rec #:  [id number]        Height:       67.0 in Accession #:    [id number]       Weight:       179.9 lb Date of Birth:  06-21-1971        BSA:          1.933 m Patient Age:    53 years         BP:           108/86 mmHg Patient Gender: F                HR:           82 bpm. Exam Location:  Inpatient Procedure: Transesophageal Echo, 3D Echo, Cardiac Doppler, Color Doppler and            Saline Contrast Bubble Study (Both Spectral and Color Flow Doppler            were utilized during procedure). Indications:     Endocarditis  History:         Patient has prior history of Echocardiogram examinations, most                  recent 07/13/2024. Signs/Symptoms:Bacteremia.  Sonographer:     Philomena Daring Referring Phys:  [id number] ALINE FORBES DOOR Diagnosing Phys: Stanly Leavens MD PROCEDURE: After discussion of the risks and benefits of a TEE, an informed consent was obtained from the patient. The transesophogeal probe was passed without difficulty through the esophogus of the patient. Imaged were obtained with the patient in a left lateral decubitus position. Sedation performed by different physician. The patient was monitored while under deep sedation. Anesthestetic sedation was provided intravenously by Anesthesiology: 200mg  of Propofol , 100mg  of Lidocaine . The patient developed no  complications during the procedure.  IMPRESSIONS  1. Left ventricular ejection fraction, by estimation, is 55 to 60%. Left ventricular ejection fraction by 3D volume is 56 %. The left ventricle has normal function.  2. Right ventricular systolic function is normal. The right ventricular size is normal.  3. No left atrial/left atrial appendage thrombus was detected. The LAA emptying velocity was 40 cm/s.  4. The mitral valve is normal in structure. Trivial mitral valve regurgitation. No evidence of mitral stenosis.  5. The aortic valve is tricuspid. Aortic valve regurgitation is not visualized. No aortic stenosis is present.  6. Agitated saline contrast bubble study was negative, with no evidence of any interatrial shunt.  7. 3D performed of the mitral valve and 3D performed of the tricuspid valve and demonstrates No Mitral valve vegetations.     No Tricuspid Valve vegetations. FINDINGS  Left Ventricle: Left ventricular ejection fraction, by estimation, is 55 to 60%. Left ventricular ejection fraction by 3D volume is 56 %. The left ventricle has normal function. The left ventricular internal cavity size was normal in size. Right Ventricle: The right ventricular size is normal. No increase in right ventricular wall thickness. Right ventricular systolic function is normal. Left Atrium: Left atrial size was normal in size. No left atrial/left atrial appendage thrombus was detected. The LAA emptying velocity was 40 cm/s. Right Atrium: Right atrial size was normal in size. Pericardium: There is no evidence of pericardial effusion. Mitral Valve: The mitral valve is normal in structure. Trivial mitral valve regurgitation. No evidence of mitral valve stenosis. Tricuspid Valve: The tricuspid valve is normal in structure. Tricuspid valve regurgitation is trivial. No evidence of tricuspid stenosis. Aortic Valve: The aortic valve is tricuspid. Aortic valve regurgitation is not visualized. No aortic stenosis is present. Aortic  valve mean gradient measures 3.0 mmHg. Aortic valve peak gradient measures 5.7 mmHg. Pulmonic Valve: The pulmonic valve was normal in structure. Pulmonic valve regurgitation is not visualized. No evidence of pulmonic stenosis. Aorta: Aortic pull back did not transfer over. Asking for these images to be re-sent. The aortic root, ascending aorta, aortic arch and descending aorta are all structurally normal, with no evidence of dilitation or obstruction. Pulmonary Artery: The pulmonary artery is of normal size. Venous: The left upper pulmonary vein, left lower pulmonary vein, right lower pulmonary vein and right upper pulmonary vein are normal. A normal flow pattern is recorded from the left upper pulmonary vein, the left lower pulmonary vein and the right upper pulmonary vein. IAS/Shunts: No atrial level shunt detected by color flow Doppler. Agitated saline contrast was given intravenously to evaluate for intracardiac shunting. Agitated saline contrast bubble study was negative, with no evidence of any interatrial shunt. Additional Comments: 3D was performed not requiring image post processing on an independent workstation and was normal. Spectral Doppler performed.  3D Volume EF LV 3D EF:    Left ventricular ejection fraction by 3D volume is 56              %. LV 3D EDV:   45.39 ml LV 3D ESV:   20.00 ml  3D Volume EF: 3D EF:        56 % AORTIC VALVE AV Vmax:           119.00 cm/s AV Vmean:          81.700 cm/s AV VTI:            0.204 m AV Peak Grad:      5.7 mmHg AV Mean Grad:  3.0 mmHg LVOT Vmax:         106.00 cm/s LVOT Vmean:        75.400 cm/s LVOT VTI:          0.167 m LVOT/AV VTI ratio: 0.82  AORTA Ao Asc diam: 2.90 cm  SHUNTS Systemic VTI: 0.17 m Stanly Leavens MD Electronically signed by Stanly Leavens MD Signature Date/Time: 07/20/2024/10:58:01 AM    Final    EP STUDY Result Date: 07/20/2024 See surgical note for result.  DG ESOPHAGUS W SINGLE CM (SOL OR THIN BA) Result Date:  07/17/2024 CLINICAL DATA:  The patient currently admitted secondary to septic shock from enterococcal bacteremia. Having some dysphagia as well as concern for possible aspiration. Consult for esophagram for further evaluation. EXAM: ESOPHAGUS/BARIUM SWALLOW/TABLET STUDY TECHNIQUE: Single contrast examination was performed using thin liquid barium. This exam was performed by Kimble Clas, PA-C, and was supervised and interpreted by Dr. Juliene Balder. FLUOROSCOPY: Radiation Exposure Index (as provided by the fluoroscopic device): 31.80 mGy Kerma COMPARISON:  None Available. FINDINGS: Examination limited secondary to poor patient mobility, patient unable to stand or roll over on fluoroscopy table. Swallowing: Unable to directly assess due to study limitations. No gross abnormality. Pharynx: Unable to directly assess due to study limitations. No gross abnormality. Esophagus: Mild narrowing in the distal esophagus near the GE junction, does not obstruct the passage of barium. Esophageal motility: Mild esophageal dysmotility. Hiatal Hernia: Question a small hiatal hernia. Gastroesophageal reflux: None visualized. Ingested 13 mm barium tablet: Not given. Patient states she was unable to tolerate swallowing the pill. Other: None. IMPRESSION: 1. Limited study with mild esophageal dysmotility. 2. Mild narrowing in the distal esophagus near the GE junction and question a small hiatal hernia. Limited evaluation of the narrowing in the distal esophagus because the patient was unable to swallow a pill. Electronically Signed   By: Juliene Balder M.D.   On: 07/17/2024 16:19   ECHOCARDIOGRAM COMPLETE Result Date: 07/13/2024    ECHOCARDIOGRAM REPORT   Patient Name:   LENDA BARATTA Date of Exam: 07/13/2024 Medical Rec #:  [id number]        Height:       67.0 in Accession #:    [id number]       Weight:       179.9 lb Date of Birth:  04-Nov-1970        BSA:          1.933 m Patient Age:    53 years         BP:           94/66 mmHg Patient  Gender: F                HR:           88 bpm. Exam Location:  Inpatient Procedure: 2D Echo, Cardiac Doppler and Color Doppler (Both Spectral and Color            Flow Doppler were utilized during procedure). Indications:    Endocarditis  History:        Patient has no prior history of Echocardiogram examinations.                 Signs/Symptoms:Bacteremia; Risk Factors:Current Smoker.  Sonographer:    Koleen Popper RDCS Referring Phys: [id number] ORN [facility]  Sonographer Comments: Image acquisition challenging due to patient body habitus. IMPRESSIONS  1. Left ventricular ejection fraction, by estimation, is 60 to 65%. The left ventricle has normal function. The  left ventricle has no regional wall motion abnormalities. Left ventricular diastolic parameters were normal.  2. Right ventricular systolic function is normal. The right ventricular size is normal.  3. The mitral valve is normal in structure. No evidence of mitral valve regurgitation. No evidence of mitral stenosis.  4. The aortic valve is normal in structure. Aortic valve regurgitation is not visualized. No aortic stenosis is present.  5. The inferior vena cava is normal in size with greater than 50% respiratory variability, suggesting right atrial pressure of 3 mmHg. Conclusion(s)/Recommendation(s): No evidence of valvular vegetations on this transthoracic echocardiogram. Consider a transesophageal echocardiogram to exclude infective endocarditis if clinically indicated. FINDINGS  Left Ventricle: Left ventricular ejection fraction, by estimation, is 60 to 65%. The left ventricle has normal function. The left ventricle has no regional wall motion abnormalities. The left ventricular internal cavity size was normal in size. There is  no left ventricular hypertrophy. Left ventricular diastolic parameters were normal. Right Ventricle: The right ventricular size is normal. No increase in right ventricular wall thickness. Right ventricular systolic function is  normal. Left Atrium: Left atrial size was normal in size. Right Atrium: Right atrial size was normal in size. Pericardium: There is no evidence of pericardial effusion. Mitral Valve: The mitral valve is normal in structure. No evidence of mitral valve regurgitation. No evidence of mitral valve stenosis. Tricuspid Valve: The tricuspid valve is normal in structure. Tricuspid valve regurgitation is not demonstrated. No evidence of tricuspid stenosis. Aortic Valve: The aortic valve is normal in structure. Aortic valve regurgitation is not visualized. No aortic stenosis is present. Pulmonic Valve: The pulmonic valve was normal in structure. Pulmonic valve regurgitation is not visualized. No evidence of pulmonic stenosis. Aorta: The aortic root is normal in size and structure. Venous: The inferior vena cava is normal in size with greater than 50% respiratory variability, suggesting right atrial pressure of 3 mmHg. IAS/Shunts: No atrial level shunt detected by color flow Doppler.  LEFT VENTRICLE PLAX 2D LVIDd:         3.80 cm     Diastology LVIDs:         2.50 cm     LV e' medial:    6.02 cm/s LV PW:         0.90 cm     LV E/e' medial:  12.8 LV IVS:        1.00 cm     LV e' lateral:   9.85 cm/s LVOT diam:     1.83 cm     LV E/e' lateral: 7.8 LV SV:         48 LV SV Index:   25 LVOT Area:     2.63 cm  LV Volumes (MOD) LV vol d, MOD A4C: 88.0 ml LV vol s, MOD A4C: 36.7 ml LV SV MOD A4C:     88.0 ml RIGHT VENTRICLE             IVC RV S prime:     10.90 cm/s  IVC diam: 1.32 cm TAPSE (M-mode): 1.2 cm LEFT ATRIUM           Index LA diam:      2.51 cm 1.30 cm/m LA Vol (A4C): 11.8 ml 6.10 ml/m  AORTIC VALVE LVOT Vmax:   105.00 cm/s LVOT Vmean:  67.700 cm/s LVOT VTI:    0.182 m  AORTA Ao Root diam: 2.92 cm MITRAL VALVE MV Area (PHT): 4.80 cm    SHUNTS MV Decel Time: 158 msec  Systemic VTI:  0.18 m MV E velocity: 76.80 cm/s  Systemic Diam: 1.83 cm MV A velocity: 69.10 cm/s MV E/A ratio:  1.11 Morene Brownie Electronically  signed by Morene Brownie Signature Date/Time: 07/13/2024/5:29:44 PM    Final    CT ABDOMEN PELVIS W CONTRAST Result Date: 07/12/2024 EXAM: CT ABDOMEN AND PELVIS WITH CONTRAST 07/11/2024 11:04:56 PM TECHNIQUE: CT of the abdomen and pelvis was performed with the administration of 75 mL of iohexol  (OMNIPAQUE ) 350 MG/ML injection. Multiplanar reformatted images are provided for review. Automated exposure control, iterative reconstruction, and/or weight-based adjustment of the mA/kV was utilized to reduce the radiation dose to as low as reasonably achievable. COMPARISON: None available. CLINICAL HISTORY: Abdominal pain, acute, nonlocalized. FINDINGS: LOWER CHEST: The lung bases are clear. There are calcifications in the LAD and right coronary arteries. The cardiac size is normal. LIVER: The liver is mildly steatotic. There is focal periligamentous fat in segment 4b. There is no mass. GALLBLADDER AND BILE DUCTS: The gallbladder is dilated, up to 12 cm in length, but there is no wall thickening or calcified gallstones. No biliary ductal dilatation. SPLEEN: No acute abnormality. PANCREAS: The pancreas is partially atrophic and otherwise unremarkable. ADRENAL GLANDS: No acute abnormality. There is no adrenal mass. KIDNEYS, URETERS AND BLADDER: No stones in the kidneys or ureters. No hydronephrosis. No perinephric or periureteral stranding. There is no renal mass. There is symmetric renal excretion on the delayed images. Urinary bladder is unremarkable. GI AND BOWEL: Stomach demonstrates no acute abnormality. There is no small bowel obstruction or inflammation. The appendix is normal. There is sigmoid diverticulosis without diverticulitis. There is mild rectal wall thickening and perirectal stranding consistent with proctitis. Direct visualization is recommended after treatment. There is formed stool throughout the colon, mild fecal stasis in the ascending and transverse segments. PERITONEUM AND RETROPERITONEUM: There is  trace ascites in the posterior deep pelvis likely reactive. There is no free hemorrhage, free air or localizing collections. No incarcerated hernia. VASCULATURE: Aorta is normal in caliber. LYMPH NODES: No lymphadenopathy. REPRODUCTIVE ORGANS: No acute abnormality. BONES AND SOFT TISSUES: There is a chronic bowtie-shaped compression fracture of the L1 vertebral body, attempted anterior bridging osteophytes to the adjacent segments, and no retropulsion. No acute or other significant osseous findings. Mild lower Lumbar facet hypertrophy. No focal soft tissue abnormality. IMPRESSION: 1. Mild rectal wall thickening and perirectal stranding consistent with proctitis. Direct visualization is recommended after treatment. 2. Dilated gallbladder up to 12 cm in length without wall thickening or calcified gallstones. No biliary dilatation. 3. Chronic L1 compression fracture. 4. Coronary artery disease. Clinical correlation recommended for risk factor mitigation and risk stratification. Electronically signed by: Francis Quam MD 07/12/2024 12:17 AM EST RP Workstation: [id number]   CT HEAD WO CONTRAST ( ) Result Date: 07/11/2024 EXAM: CT HEAD WITHOUT CONTRAST 07/11/2024 11:04:56 PM TECHNIQUE: CT of the head was performed without the administration of intravenous contrast. Automated exposure control, iterative reconstruction, and/or weight based adjustment of the mA/kV was utilized to reduce the radiation dose to as low as reasonably achievable. COMPARISON: None available. CLINICAL HISTORY: Mental status change, unknown cause FINDINGS: BRAIN AND VENTRICLES: No acute hemorrhage. No evidence of acute infarct. No hydrocephalus. No extra-axial collection. No mass effect or midline shift. ORBITS: No acute abnormality. SINUSES: Right OMC obstructive pattern of paranasal sinus disease. Chronic left lamina papyracea fracture. SOFT TISSUES AND SKULL: No acute soft tissue abnormality. No skull fracture. IMPRESSION: 1. No acute  intracranial abnormality. 2. 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  Electronically signed by: Gilmore Molt 07/11/2024 11:48 PM EST RP Workstation: [id number]      Subjective: No complaints reported.    Discharge Exam:  Vitals:   07/23/24 1739 07/23/24 2016 07/24/24 0352 07/24/24 1048  BP: 101/67 109/83 118/77 110/82  Pulse: 82 89 93 96  Resp: 17 17 16 17   Temp: (!) 97.3 F (36.3 C) (!) 97.5 F (36.4 C) 98.1 F (36.7 C) 98.5 F (36.9 C)  TempSrc: Oral  Oral Oral  SpO2: 100% 100% 100% 100%  Weight:      Height:        General exam: Young female, looks older than stated age, sitting up comfortably in bed. Respiratory system: Clear to auscultation. Respiratory effort normal. Cardiovascular system: S1 & S2 heard, RRR. No JVD, murmurs, rubs, gallops or clicks.  1-2+ pitting bilateral leg edema.  Has not gotten the compression stockings that were ordered yesterday. Gastrointestinal system: Abdomen is nondistended, soft and nontender. No organomegaly or masses felt. Normal bowel sounds heard. Central nervous system: Alert and oriented. No focal neurological deficits. Extremities: Symmetric 5 x 5 power. Skin: No rashes, lesions or ulcers Psychiatry: Judgement and insight appear somewhat impaired. Mood & affect appropriate    The results of significant diagnostics from this hospitalization (including imaging, microbiology, ancillary and laboratory) are listed below for reference.     Microbiology: No results found for this or any previous visit (from the past 240 hours).   Labs: CBC: Recent Labs  Lab 07/20/24 0500  WBC 5.0  NEUTROABS 2.1  HGB 11.8*  HCT 35.5*  MCV 94.7  PLT 208    Basic Metabolic Panel: Recent Labs  Lab 07/18/24 0818 07/20/24 0500 07/22/24 0628 07/23/24 0633  NA 138 140 138 136  K 3.6 3.3* 3.7 4.9  CL 103 102 105 105  CO2 26 28 26 23   GLUCOSE 63* 62* 67* 68*  BUN 6 9 8 8   CREATININE 0.74 0.86 0.87 0.88  CALCIUM 7.8*  8.1* 8.4* 8.1*  MG  --  1.6* 2.0 1.8  PHOS 2.4*  --   --  2.5    Liver Function Tests: Recent Labs  Lab 07/18/24 0818  ALBUMIN 2.0*    CBG: Recent Labs  Lab 07/22/24 0827 07/22/24 1246 07/22/24 1649 07/22/24 2024  GLUCAP 70 71 76 89     Urinalysis    Component Value Date/Time   COLORURINE AMBER (A) 07/12/2024 0000   APPEARANCEUR TURBID (A) 07/12/2024 0000   LABSPEC 1.048 (H) 07/12/2024 0000   PHURINE 5.0 07/12/2024 0000   GLUCOSEU NEGATIVE 07/12/2024 0000   HGBUR SMALL (A) 07/12/2024 0000   BILIRUBINUR NEGATIVE 07/12/2024 0000   KETONESUR NEGATIVE 07/12/2024 0000   PROTEINUR 100 (A) 07/12/2024 0000   NITRITE NEGATIVE 07/12/2024 0000   LEUKOCYTESUR LARGE (A) 07/12/2024 0000    Discussed in detail with patient's daughter at bedside, updated care and answered all questions.  Time coordinating discharge: 40 minutes  SIGNED:  Trenda Mar, MD,  FACP, [facility], [facility], [facility]   [facility] [place]    To contact the attending provider between 7A-7P or the covering provider during after hours 7P-7A, please log into the web site [email or web address] and access using universal [place] password for that web site. If you do not have the password, please call the hospital operator.    [1] No Known Allergies

## 2024-07-24 NOTE — TOC Progression Note (Signed)
 Transition of Care [facility]) - Progression Note    Patient Details  Name: PROMYSE ARDITO MRN: [id number] Date of Birth: [birth year]  Transition of Care [facility]) CM/SW Contact  Enola Siebers LITTIE [place], [place] Phone Number: 07/24/2024, 1:53 PM  Clinical Narrative:    CSW received a message from Brittany w/ [facility], they received insurance auth approval for pt. CSW confirmed bed availability with Brittany, pt can admit to facility when medically ready for dc.   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, SNF Pending bed offer               Expected Discharge Plan and Services       Living arrangements for the past 2 months: Single Family Home                                       Social Drivers of Health (SDOH) Interventions SDOH Screenings   Food Insecurity: No Food Insecurity (07/12/2024)  Housing: Low Risk (07/12/2024)  Transportation Needs: No Transportation Needs (07/12/2024)  Utilities: Not At Risk (07/12/2024)  Tobacco Use: High Risk (07/14/2024)    Readmission Risk Interventions    04/14/2024    3:13 PM  Readmission Risk Prevention Plan  Post Dischage Appt Complete  Medication Screening Complete  Transportation Screening Complete

## 2024-08-10 ENCOUNTER — Inpatient Hospital Stay: Admitting: Family

## 2024-08-12 ENCOUNTER — Other Ambulatory Visit (HOSPITAL_BASED_OUTPATIENT_CLINIC_OR_DEPARTMENT_OTHER): Payer: Self-pay

## 2024-08-27 ENCOUNTER — Ambulatory Visit: Payer: Self-pay | Admitting: Diagnostic Neuroimaging

## 2024-09-03 ENCOUNTER — Inpatient Hospital Stay: Admitting: Family
